# Patient Record
Sex: Male | Born: 1965 | Race: Black or African American | Hispanic: No | Marital: Single | Smoking: Former smoker
Health system: Southern US, Community
[De-identification: ages and names within clinical notes are randomized; demographics above are authoritative.]

## PROBLEM LIST (undated history)

## (undated) DIAGNOSIS — K219 Gastro-esophageal reflux disease without esophagitis: Secondary | ICD-10-CM

## (undated) DIAGNOSIS — I10 Essential (primary) hypertension: Secondary | ICD-10-CM

## (undated) HISTORY — DX: Gastro-esophageal reflux disease without esophagitis: K21.9

## (undated) HISTORY — PX: HAND SURGERY: SHX662

---

## 2018-02-19 ENCOUNTER — Encounter (HOSPITAL_COMMUNITY): Payer: Self-pay | Admitting: Emergency Medicine

## 2018-02-19 ENCOUNTER — Ambulatory Visit (HOSPITAL_COMMUNITY)
Admission: EM | Admit: 2018-02-19 | Discharge: 2018-02-19 | Disposition: A | Discharge status: Home / Self Care | Payer: Self-pay | Attending: Emergency Medicine | Admitting: Emergency Medicine

## 2018-02-19 ENCOUNTER — Other Ambulatory Visit: Payer: Self-pay

## 2018-02-19 DIAGNOSIS — R05 Cough: Secondary | ICD-10-CM

## 2018-02-19 DIAGNOSIS — R059 Cough, unspecified: Secondary | ICD-10-CM

## 2018-02-19 DIAGNOSIS — I10 Essential (primary) hypertension: Secondary | ICD-10-CM

## 2018-02-19 DIAGNOSIS — N3001 Acute cystitis with hematuria: Secondary | ICD-10-CM

## 2018-02-19 LAB — POCT URINALYSIS DIP (DEVICE)
Bilirubin Urine: NEGATIVE
GLUCOSE, UA: NEGATIVE mg/dL
Ketones, ur: 15 mg/dL — AB
Nitrite: NEGATIVE
Protein, ur: 30 mg/dL — AB
Specific Gravity, Urine: 1.02 (ref 1.005–1.030)
Urobilinogen, UA: >=8 mg/dL (ref 0.0–1.0)
pH: 7 (ref 5.0–8.0)

## 2018-02-19 MED ORDER — CEPHALEXIN 500 MG PO CAPS: 500.0000 mg | ORAL_CAPSULE | Freq: Four times a day (QID) | ORAL | 0 refills | Status: DC

## 2018-02-19 MED ORDER — LISINOPRIL 5 MG PO TABS: 10.0000 mg | ORAL_TABLET | Freq: Every day | ORAL | 1 refills | Status: DC

## 2018-02-19 NOTE — ED Provider Notes (Signed)
[place]    CSN: [id number] Arrival date & time: 02/19/18  1619     History   Chief Complaint Chief Complaint  Patient presents with  . URI    HPI Timothy Moore is a 52 y.o. male.   Pt here for cough that is getting better. Took nyquil lastnight for this and has noticed dark urine, lower back pain with flank pain, states that he does have bp issues not taking bp meds as prescribed. Thought that the meds caused his urine to become dark. Denies any headache, no sob, no chest pain no weakness       History reviewed. No pertinent past medical history.  There are no active problems to display for this patient.   History reviewed. No pertinent surgical history.     Home Medications    Prior to Admission medications   Medication Sig Start Date End Date Taking? Authorizing Provider  cephALEXin (KEFLEX) 500 MG capsule Take 1 capsule (500 mg total) by mouth 4 (four) times daily. 02/19/18   Marney Setting, NP  lisinopril (PRINIVIL,ZESTRIL) 5 MG tablet Take 2 tablets (10 mg total) by mouth daily. 02/19/18   Marney Setting, NP    Family History Family History  Problem Relation Age of Onset  . Kidney Stones Mother   . Cancer Father     Social History Social History   Tobacco Use  . Smoking status: Never Smoker  Substance Use Topics  . Alcohol use: Yes  . Drug use: Never     Allergies   Patient has no known allergies.   Review of Systems Review of Systems  HENT: Negative.   Respiratory: Positive for cough.   Cardiovascular: Negative.   Gastrointestinal: Negative.   Endocrine: Positive for polyuria.  Genitourinary: Positive for flank pain, frequency and urgency.  Skin: Negative.   Neurological: Negative.      Physical Exam Triage Vital Signs ED Triage Vitals  Enc Vitals Group     BP 02/19/18 1737 (!) 163/94     Pulse Rate 02/19/18 1737 98     Resp 02/19/18 1737 20     Temp 02/19/18 1737 97.7 F (36.5 C)     Temp Source  02/19/18 1737 Oral     SpO2 02/19/18 1737 100 %     Weight --      Height --      Head Circumference --      Peak Flow --      Pain Score 02/19/18 1734 5     Pain Loc --      Pain Edu? --      Excl. in [place]? --    No data found.  Updated Vital Signs BP (!) 163/94 (BP Location: Right Arm)   Pulse 98   Temp 97.7 F (36.5 C) (Oral)   Resp 20   SpO2 100%   Visual Acuity Right Eye Distance:   Left Eye Distance:   Bilateral Distance:    Right Eye Near:   Left Eye Near:    Bilateral Near:     Physical Exam  Constitutional: He appears well-developed.  HENT:  Head: Normocephalic.  Right Ear: External ear normal.  Left Ear: External ear normal.  Mouth/Throat: Oropharynx is clear and moist.  Eyes: Pupils are equal, round, and reactive to light.  Neck: Normal range of motion.  Cardiovascular: Normal rate and regular rhythm.  Pulmonary/Chest: Effort normal and breath sounds normal.  Abdominal: Soft. Bowel sounds are normal. There is  tenderness.  cv bil   Musculoskeletal: Normal range of motion.  Neurological: He is alert.  Skin: Skin is warm.     UC Treatments / Results  Labs (all labs ordered are listed, but only abnormal results are displayed) Labs Reviewed  POCT URINALYSIS DIP (DEVICE) - Abnormal; Notable for the following components:      Result Value   Ketones, ur 15 (*)    Hgb urine dipstick SMALL (*)    Protein, ur 30 (*)    Leukocytes, UA SMALL (*)    All other components within normal limits    EKG None  Radiology No results found.  Procedures Procedures (including critical care time)  Medications Ordered in UC Medications - No data to display  Initial Impression / Assessment and Plan / UC Course  I have reviewed the triage vital signs and the nursing notes.  Pertinent labs & imaging results that were available during my care of the patient were reviewed by me and considered in my medical decision making (see chart for details).     Will need  to cont bp meds  Stay hydrated Eat prior to abx  Follow up with a pcp for uncontrolled HTN IF you have symptoms of shortness of breath, chest pain, or headache you will need to go to the ER  Final Clinical Impressions(s) / UC Diagnoses   Final diagnoses:  Cough  Acute cystitis with hematuria  Essential hypertension     Discharge Instructions     Will need to cont bp meds  Stay hydrated Eat prior to abx  Follow up with a pcp for uncontrolled HTN IF you have symptoms of shortness of breath, chest pain, or headache you will need to go to the ER     ED Prescriptions    Medication Sig Dispense Auth. Provider   cephALEXin (KEFLEX) 500 MG capsule Take 1 capsule (500 mg total) by mouth 4 (four) times daily. 20 capsule Morley Kos L, NP   lisinopril (PRINIVIL,ZESTRIL) 5 MG tablet Take 2 tablets (10 mg total) by mouth daily. 30 tablet Marney Setting, NP     Controlled Substance Prescriptions [place] Controlled Substance Registry consulted? Not Applicable   Marney Setting, NP 02/19/18 1859

## 2018-02-19 NOTE — Discharge Instructions (Addendum)
Will need to cont bp meds  Stay hydrated Eat prior to abx  Follow up with a pcp for uncontrolled HTN IF you have symptoms of shortness of breath, chest pain, or headache you will need to go to the ER

## 2018-02-19 NOTE — ED Triage Notes (Signed)
Complains of lower back pain.  Patient reports urine is "yellow/orange " today.  Yesterday had cough, stuffy nose and dizzy and no energy.

## 2018-02-19 NOTE — ED Notes (Signed)
Sent to bathroom to get urine

## 2018-05-04 ENCOUNTER — Encounter (HOSPITAL_COMMUNITY): Payer: Self-pay

## 2018-05-04 ENCOUNTER — Ambulatory Visit (HOSPITAL_COMMUNITY)
Admission: EM | Admit: 2018-05-04 | Discharge: 2018-05-04 | Disposition: A | Discharge status: Home / Self Care | Payer: Self-pay | Attending: Family Medicine | Admitting: Family Medicine

## 2018-05-04 DIAGNOSIS — H66001 Acute suppurative otitis media without spontaneous rupture of ear drum, right ear: Secondary | ICD-10-CM

## 2018-05-04 DIAGNOSIS — H60501 Unspecified acute noninfective otitis externa, right ear: Secondary | ICD-10-CM

## 2018-05-04 MED ORDER — OFLOXACIN 0.3 % OT SOLN: 5.0000 [drp] | Freq: Every day | OTIC | 0 refills | Status: AC

## 2018-05-04 MED ORDER — AMOXICILLIN-POT CLAVULANATE 875-125 MG PO TABS: 1.0000 | ORAL_TABLET | Freq: Two times a day (BID) | ORAL | 0 refills | Status: AC

## 2018-05-04 NOTE — Discharge Instructions (Signed)
Complete course of ear drops and oral antibiotics for your ear infections.  Please follow up with primary care provider and/or ENT for any persistent or recurrent symptoms.

## 2018-05-04 NOTE — ED Triage Notes (Signed)
Pt present right ear pain, symptoms started last night. Pt states his right ear feels clogged and some drainage.

## 2018-05-05 NOTE — ED Provider Notes (Signed)
[place]    CSN: [id number] Arrival date & time: 05/04/18  1912     History   Chief Complaint Chief Complaint  Patient presents with  . Otalgia    Right Ear    HPI Timothy Moore is a 53 y.o. male.   Timothy Moore presents with complaints of right ear pain. Started out as itching, tried applying peroxide to a qtip to cleanse the ear which has not helped. Today has become painful with pain surrounding the ear. He feels a "clogged" sensation with decreased hearing. Has some runny nose. Hasn't tried any other medications for symptoms. Pain 9/10. Denies any previous similar. Denies ear bud use or underwater exposure. Hx of htn.    ROS per HPI.      History reviewed. No pertinent past medical history.  There are no active problems to display for this patient.   History reviewed. No pertinent surgical history.     Home Medications    Prior to Admission medications   Medication Sig Start Date End Date Taking? Authorizing Provider  amoxicillin-clavulanate (AUGMENTIN) 875-125 MG tablet Take 1 tablet by mouth every 12 (twelve) hours for 7 days. 05/04/18 05/11/18  Zigmund Gottron, NP  cephALEXin (KEFLEX) 500 MG capsule Take 1 capsule (500 mg total) by mouth 4 (four) times daily. 02/19/18   Marney Setting, NP  lisinopril (PRINIVIL,ZESTRIL) 5 MG tablet Take 2 tablets (10 mg total) by mouth daily. 02/19/18   Marney Setting, NP  ofloxacin (FLOXIN) 0.3 % OTIC solution Place 5 drops into the right ear daily for 7 days. 05/04/18 05/11/18  Zigmund Gottron, NP    Family History Family History  Problem Relation Age of Onset  . Kidney Stones Mother   . Cancer Father     Social History Social History   Tobacco Use  . Smoking status: Never Smoker  Substance Use Topics  . Alcohol use: Yes  . Drug use: Never     Allergies   Patient has no known allergies.   Review of Systems Review of Systems   Physical Exam Triage Vital Signs ED Triage Vitals  Enc  Vitals Group     BP 05/04/18 1948 (!) 154/109     Pulse Rate 05/04/18 1948 79     Resp 05/04/18 1948 18     Temp 05/04/18 1948 98.9 F (37.2 C)     Temp Source 05/04/18 1948 Oral     SpO2 05/04/18 1948 99 %     Weight --      Height --      Head Circumference --      Peak Flow --      Pain Score 05/04/18 1949 10     Pain Loc --      Pain Edu? --      Excl. in [place]? --    No data found.  Updated Vital Signs BP (!) 154/109 (BP Location: Right Arm)   Pulse 79   Temp 98.9 F (37.2 C) (Oral)   Resp 18   SpO2 99%   Visual Acuity Right Eye Distance:   Left Eye Distance:   Bilateral Distance:    Right Eye Near:   Left Eye Near:    Bilateral Near:     Physical Exam Constitutional:      Appearance: He is well-developed.  HENT:     Head: Normocephalic and atraumatic.     Right Ear: Drainage, swelling and tenderness present. Tympanic membrane is erythematous.  Left Ear: Tympanic membrane and ear canal normal.     Ears:     Comments: TM partially occluded by canal white drainage, visible TM with redness    Nose: Nose normal.  Cardiovascular:     Rate and Rhythm: Normal rate and regular rhythm.  Pulmonary:     Effort: Pulmonary effort is normal.     Breath sounds: Normal breath sounds.  Skin:    General: Skin is warm and dry.  Neurological:     Mental Status: He is alert and oriented to person, place, and time.      UC Treatments / Results  Labs (all labs ordered are listed, but only abnormal results are displayed) Labs Reviewed - No data to display  EKG None  Radiology No results found.  Procedures Procedures (including critical care time)  Medications Ordered in UC Medications - No data to display  Initial Impression / Assessment and Plan / UC Course  I have reviewed the triage vital signs and the nursing notes.  Pertinent labs & imaging results that were available during my care of the patient were reviewed by me and considered in my medical  decision making (see chart for details).     Concern for AOM as well as otitis externa. augmentin and ofloxacin provided. Return precautions provided. If symptoms worsen or do not improve in the next week to return to be seen or to follow up with PCP and/or ENT.  Patient verbalized understanding and agreeable to plan.    Final Clinical Impressions(s) / UC Diagnoses   Final diagnoses:  Acute otitis externa of right ear, unspecified type  Acute suppurative otitis media of right ear without spontaneous rupture of tympanic membrane, recurrence not specified     Discharge Instructions     Complete course of ear drops and oral antibiotics for your ear infections.  Please follow up with primary care provider and/or ENT for any persistent or recurrent symptoms.    ED Prescriptions    Medication Sig Dispense Auth. Provider   amoxicillin-clavulanate (AUGMENTIN) 875-125 MG tablet Take 1 tablet by mouth every 12 (twelve) hours for 7 days. 14 tablet Augusto Gamble B, NP   ofloxacin (FLOXIN) 0.3 % OTIC solution Place 5 drops into the right ear daily for 7 days. 5 mL Augusto Gamble B, NP     Controlled Substance Prescriptions [place] Controlled Substance Registry consulted? Not Applicable   Zigmund Gottron, NP 05/05/18 1009

## 2018-12-17 ENCOUNTER — Other Ambulatory Visit: Payer: Self-pay

## 2018-12-17 ENCOUNTER — Ambulatory Visit (HOSPITAL_COMMUNITY)
Admission: EM | Admit: 2018-12-17 | Discharge: 2018-12-17 | Disposition: A | Discharge status: Home / Self Care | Payer: Self-pay | Attending: Family Medicine | Admitting: Family Medicine

## 2018-12-17 ENCOUNTER — Encounter (HOSPITAL_COMMUNITY): Payer: Self-pay | Admitting: Emergency Medicine

## 2018-12-17 DIAGNOSIS — I10 Essential (primary) hypertension: Secondary | ICD-10-CM | POA: Insufficient documentation

## 2018-12-17 DIAGNOSIS — R350 Frequency of micturition: Secondary | ICD-10-CM | POA: Insufficient documentation

## 2018-12-17 DIAGNOSIS — N50819 Testicular pain, unspecified: Secondary | ICD-10-CM | POA: Insufficient documentation

## 2018-12-17 HISTORY — DX: Essential (primary) hypertension: I10

## 2018-12-17 LAB — POCT URINALYSIS DIP (DEVICE)
Bilirubin Urine: NEGATIVE
Glucose, UA: NEGATIVE mg/dL
Hgb urine dipstick: NEGATIVE
Ketones, ur: NEGATIVE mg/dL
Leukocytes,Ua: NEGATIVE
Nitrite: NEGATIVE
Protein, ur: NEGATIVE mg/dL
Specific Gravity, Urine: 1.025 (ref 1.005–1.030)
Urobilinogen, UA: 0.2 mg/dL (ref 0.0–1.0)
pH: 7 (ref 5.0–8.0)

## 2018-12-17 MED ORDER — SULFAMETHOXAZOLE-TRIMETHOPRIM 800-160 MG PO TABS: 1.0000 | ORAL_TABLET | Freq: Two times a day (BID) | ORAL | 0 refills | Status: DC

## 2018-12-17 MED ORDER — LISINOPRIL 10 MG PO TABS: 10.0000 mg | ORAL_TABLET | Freq: Every day | ORAL | 1 refills | Status: DC

## 2018-12-17 NOTE — ED Provider Notes (Signed)
[place] [facility]    CSN: [id number] Arrival date & time: 12/17/18  1850      History   Chief Complaint Chief Complaint  Patient presents with  . Testicle Pain  . Urinary Frequency  . Flank Pain    HPI Timothy Moore is a 53 y.o. male.   HPI Patient has some left lower abdominal pain, left testicle pain, and pain with urination.  States he has urinary frequency.  He states sometimes he cannot get to the bathroom in time.  He states he has to get up more in the night to urinate.  He is 53 years old.  He is never been told he has prostate problems.  He does not go to the doctor on a regular basis.  He has been told he has elevated blood pressure.  He has not taken his blood pressure medicine for some time.  He states that he has 2 "lady friends".  He does not think he has any infection, but would like to be tested for STDs.  No penile discharge, no rash.  No history of kidney stones or problems. Past Medical History:  Diagnosis Date  . Hypertension     There are no active problems to display for this patient.   History reviewed. No pertinent surgical history.     Home Medications    Prior to Admission medications   Medication Sig Start Date End Date Taking? Authorizing Provider  lisinopril (ZESTRIL) 10 MG tablet Take 1 tablet (10 mg total) by mouth daily. 12/17/18   Raylene Everts, MD  sulfamethoxazole-trimethoprim (BACTRIM DS) 800-160 MG tablet Take 1 tablet by mouth 2 (two) times daily. 12/17/18   Raylene Everts, MD    Family History Family History  Problem Relation Age of Onset  . Kidney Stones Mother   . Cancer Father     Social History Social History   Tobacco Use  . Smoking status: Never Smoker  Substance Use Topics  . Alcohol use: Yes  . Drug use: Never     Allergies   Patient has no known allergies.   Review of Systems Review of Systems  Constitutional: Negative for chills and fever.  HENT: Negative for ear pain and sore throat.    Eyes: Negative for pain and visual disturbance.  Respiratory: Negative for cough and shortness of breath.   Cardiovascular: Negative for chest pain and palpitations.  Gastrointestinal: Negative for abdominal pain and vomiting.  Genitourinary: Positive for difficulty urinating, frequency, penile pain and testicular pain. Negative for dysuria and hematuria.  Musculoskeletal: Negative for arthralgias and back pain.  Skin: Negative for color change and rash.  Neurological: Negative for seizures and syncope.  All other systems reviewed and are negative.    Physical Exam Triage Vital Signs ED Triage Vitals  Enc Vitals Group     BP 12/17/18 1920 (!) 167/108     Pulse Rate 12/17/18 1920 67     Resp 12/17/18 1920 14     Temp 12/17/18 1920 97.7 F (36.5 C)     Temp src --      SpO2 12/17/18 1920 100 %     Weight --      Height --      Head Circumference --      Peak Flow --      Pain Score 12/17/18 1923 2     Pain Loc --      Pain Edu? --      Excl. in [place]? --  No data found.  Updated Vital Signs BP (!) 167/108   Pulse 67   Temp 97.7 F (36.5 C)   Resp 14   SpO2 100%       Physical Exam Constitutional:      General: He is not in acute distress.    Appearance: He is well-developed. He is not ill-appearing.  HENT:     Head: Normocephalic and atraumatic.  Eyes:     Conjunctiva/sclera: Conjunctivae normal.     Pupils: Pupils are equal, round, and reactive to light.  Neck:     Musculoskeletal: Normal range of motion.  Cardiovascular:     Rate and Rhythm: Normal rate and regular rhythm.     Heart sounds: Normal heart sounds.  Pulmonary:     Effort: Pulmonary effort is normal. No respiratory distress.     Breath sounds: Normal breath sounds.  Abdominal:     General: Abdomen is flat. There is no distension.     Palpations: Abdomen is soft.     Tenderness: There is no abdominal tenderness. There is no right CVA tenderness or left CVA tenderness.  Genitourinary:     Penis: Normal.      Scrotum/Testes: Normal.  Musculoskeletal: Normal range of motion.  Skin:    General: Skin is warm and dry.  Neurological:     General: No focal deficit present.     Mental Status: He is alert.  Psychiatric:        Behavior: Behavior normal.      UC Treatments / Results  Labs (all labs ordered are listed, but only abnormal results are displayed) Labs Reviewed  POCT URINALYSIS DIP (DEVICE)  CYTOLOGY, (ORAL, ANAL, URETHRAL) ANCILLARY ONLY    EKG   Radiology No results found.  Procedures Procedures (including critical care time)  Medications Ordered in UC Medications - No data to display  Initial Impression / Assessment and Plan / UC Course  I have reviewed the triage vital signs and the nursing notes.  Pertinent labs & imaging results that were available during my care of the patient were reviewed by me and considered in my medical decision making (see chart for details).     Treating the patient with antibiotics even though his urine sample is negative, I concerned that he may have prostate infection causing his back and testicular pain.  No hematuria to suggest kidney stones.  His pain is not particularly severe.  He is not taking any medicine for pain.  I did discuss with him the importance of staying on blood pressure medication.  STD testing is done Final Clinical Impressions(s) / UC Diagnoses   Final diagnoses:  Pain in testicle, unspecified laterality  Urinary frequency  Essential hypertension     Discharge Instructions     We will call you if any of your tests are positive Take the antibiotic 2 times a day I have refilled your blood pressure medicine for you.  Take this once a day You need to see a primary care doctor.  I have included one that is taking new patients Drink more water Try to get enough exercise   ED Prescriptions    Medication Sig Dispense Auth. Provider   lisinopril (ZESTRIL) 10 MG tablet Take 1 tablet (10 mg  total) by mouth daily. 30 tablet Raylene Everts, MD   sulfamethoxazole-trimethoprim (BACTRIM DS) 800-160 MG tablet Take 1 tablet by mouth 2 (two) times daily. 20 tablet Raylene Everts, MD     PDMP not reviewed  this encounter.   Raylene Everts, MD 12/17/18 2151

## 2018-12-17 NOTE — Discharge Instructions (Addendum)
We will call you if any of your tests are positive Take the antibiotic 2 times a day I have refilled your blood pressure medicine for you.  Take this once a day You need to see a primary care doctor.  I have included one that is taking new patients Drink more water Try to get enough exercise

## 2018-12-17 NOTE — ED Triage Notes (Signed)
Pt c.o frequent urination x3 days,started having R flank pain, and L testicle pain today.

## 2018-12-19 LAB — CYTOLOGY, (ORAL, ANAL, URETHRAL) ANCILLARY ONLY
Chlamydia: NEGATIVE
Neisseria Gonorrhea: NEGATIVE
Trichomonas: POSITIVE — AB

## 2018-12-30 ENCOUNTER — Telehealth (HOSPITAL_COMMUNITY): Payer: Self-pay | Admitting: Emergency Medicine

## 2018-12-30 MED ORDER — METRONIDAZOLE 500 MG PO TABS: 2000.0000 mg | ORAL_TABLET | Freq: Once | ORAL | 0 refills | Status: AC

## 2018-12-30 NOTE — Telephone Encounter (Signed)
Trichomonas is positive. Rx  for Flagyl 2 grams, once was sent to the pharmacy of record. Pt needs education to refrain from sexual intercourse for 7 days to give the medicine time to work. Sexual partners need to be notified and tested/treated. Condoms may reduce risk of reinfection. Recheck for further evaluation if symptoms are not improving.   Patient contacted and made aware of    results, all questions answered    

## 2019-04-05 ENCOUNTER — Ambulatory Visit: Payer: Self-pay | Attending: Internal Medicine

## 2019-04-05 DIAGNOSIS — Z20822 Contact with and (suspected) exposure to covid-19: Secondary | ICD-10-CM | POA: Insufficient documentation

## 2019-04-06 ENCOUNTER — Telehealth: Payer: Self-pay | Admitting: General Practice

## 2019-04-06 ENCOUNTER — Telehealth: Payer: Self-pay | Admitting: *Deleted

## 2019-04-06 LAB — NOVEL CORONAVIRUS, NAA: SARS-CoV-2, NAA: NOT DETECTED

## 2019-04-06 NOTE — Telephone Encounter (Signed)
pateint called in and received his negative covid test result

## 2019-04-06 NOTE — Telephone Encounter (Signed)
Pt notified that his results for covid 19 has not resulted. He voiced understanding.

## 2019-05-01 ENCOUNTER — Other Ambulatory Visit: Payer: Self-pay

## 2019-05-01 ENCOUNTER — Emergency Department
Admission: EM | Admit: 2019-05-01 | Discharge: 2019-05-01 | Disposition: A | Discharge status: Home / Self Care | Payer: Self-pay | Attending: Emergency Medicine | Admitting: Emergency Medicine

## 2019-05-01 ENCOUNTER — Emergency Department: Payer: Self-pay

## 2019-05-01 DIAGNOSIS — I10 Essential (primary) hypertension: Secondary | ICD-10-CM | POA: Insufficient documentation

## 2019-05-01 DIAGNOSIS — W11XXXA Fall on and from ladder, initial encounter: Secondary | ICD-10-CM | POA: Insufficient documentation

## 2019-05-01 DIAGNOSIS — Y999 Unspecified external cause status: Secondary | ICD-10-CM | POA: Insufficient documentation

## 2019-05-01 DIAGNOSIS — Y939 Activity, unspecified: Secondary | ICD-10-CM | POA: Insufficient documentation

## 2019-05-01 DIAGNOSIS — W19XXXA Unspecified fall, initial encounter: Secondary | ICD-10-CM

## 2019-05-01 DIAGNOSIS — Y929 Unspecified place or not applicable: Secondary | ICD-10-CM | POA: Insufficient documentation

## 2019-05-01 DIAGNOSIS — S0003XA Contusion of scalp, initial encounter: Secondary | ICD-10-CM | POA: Insufficient documentation

## 2019-05-01 DIAGNOSIS — Z79899 Other long term (current) drug therapy: Secondary | ICD-10-CM | POA: Insufficient documentation

## 2019-05-01 MED ORDER — MELOXICAM 7.5 MG PO TABS
15.0000 mg | ORAL_TABLET | Freq: Once | ORAL | Status: AC
  Administered 2019-05-01: 15 mg via ORAL
  Filled 2019-05-01: qty 2

## 2019-05-01 MED ORDER — METHOCARBAMOL 500 MG PO TABS
1000.0000 mg | ORAL_TABLET | Freq: Once | ORAL | Status: AC
  Administered 2019-05-01: 1000 mg via ORAL
  Filled 2019-05-01: qty 2

## 2019-05-01 MED ORDER — METHOCARBAMOL 500 MG PO TABS: 500.0000 mg | ORAL_TABLET | Freq: Four times a day (QID) | ORAL | 0 refills | Status: DC

## 2019-05-01 MED ORDER — OXYCODONE-ACETAMINOPHEN 5-325 MG PO TABS
1.0000 | ORAL_TABLET | Freq: Once | ORAL | Status: AC
  Administered 2019-05-01: 1 via ORAL
  Filled 2019-05-01: qty 1

## 2019-05-01 MED ORDER — MELOXICAM 15 MG PO TABS: 15.0000 mg | ORAL_TABLET | Freq: Every day | ORAL | 0 refills | Status: DC

## 2019-05-01 NOTE — ED Triage Notes (Signed)
Pt was about 5 feet up a ladder and fell states landing on his head. Slight hematoma noted to top of head, no loc. Pt co pain to area, no loc. Co neck stiffness, states happened around 1400.

## 2019-05-01 NOTE — ED Notes (Signed)
E-signature not working at this time. Pt verbalized understanding of D/C instructions, prescriptions and follow up care with no further questions at this time. Pt in NAD and ambulatory at time of D/C.  

## 2019-05-01 NOTE — ED Notes (Signed)
C-collar in place

## 2019-05-01 NOTE — ED Provider Notes (Signed)
[facility] Emergency Department Provider Note  ____________________________________________  Time seen: Approximately 9:27 PM  I have reviewed the triage vital signs and the nursing notes.   HISTORY  Chief Complaint Head Injury    HPI Timothy Moore is a 54 y.o. male who presents the emergency department for evaluation of head injury after falling off a ladder.  Patient reports that he is approximately 5 feet off a ladder when he fell.  Patient did hit his head on the floor.  He did not lose consciousness.  Patient is currently complaining of a "knot" to the top of his head, headache, neck stiffness.  Patient denies any significant neck pain, states that it feels "stiff."  No radicular symptoms in the upper extremities.  No bowel or bladder tension, saddle anesthesia or paresthesias.  No other injuries or complaints.         Past Medical History:  Diagnosis Date  . Hypertension     There are no problems to display for this patient.   No past surgical history on file.  Prior to Admission medications   Medication Sig Start Date End Date Taking? Authorizing Provider  lisinopril (ZESTRIL) 10 MG tablet Take 1 tablet (10 mg total) by mouth daily. 12/17/18   Raylene Everts, MD  meloxicam (MOBIC) 15 MG tablet Take 1 tablet (15 mg total) by mouth daily. 05/01/19   Krystalle Pilkington, Charline Bills, PA-C  methocarbamol (ROBAXIN) 500 MG tablet Take 1 tablet (500 mg total) by mouth 4 (four) times daily. 05/01/19   Peony Barner, Charline Bills, PA-C  sulfamethoxazole-trimethoprim (BACTRIM DS) 800-160 MG tablet Take 1 tablet by mouth 2 (two) times daily. 12/17/18   Raylene Everts, MD    Allergies Patient has no known allergies.  Family History  Problem Relation Age of Onset  . Kidney Stones Mother   . Cancer Father     Social History Social History   Tobacco Use  . Smoking status: Never Smoker  Substance Use Topics  . Alcohol use: Yes  . Drug use: Never     Review  of Systems  Constitutional: No fever/chills Eyes: No visual changes. No discharge ENT: No upper respiratory complaints. Cardiovascular: no chest pain. Respiratory: no cough. No SOB. Gastrointestinal: No abdominal pain.  No nausea, no vomiting.  No diarrhea.  No constipation. Musculoskeletal: Positive for neck stiffness Skin: Negative for rash, abrasions, lacerations, ecchymosis. Neurological: Positive for head injury resulting in a headache but denies focal weakness or numbness. 10-point ROS otherwise negative.  ____________________________________________   PHYSICAL EXAM:  VITAL SIGNS: ED Triage Vitals [05/01/19 1930]  Enc Vitals Group     BP (!) 166/106     Pulse Rate 83     Resp 20     Temp 98.8 F (37.1 C)     Temp Source Oral     SpO2 98 %     Weight 170 lb (77.1 kg)     Height 5\' 5"  (1.651 m)     Head Circumference      Peak Flow      Pain Score 7     Pain Loc      Pain Edu?      Excl. in [place]?      Constitutional: Alert and oriented. Well appearing and in no acute distress. Eyes: Conjunctivae are normal. PERRL. EOMI. Head: Hematoma noted to the mid occipital skull region.  No lacerations or abrasions.  Patient with tenderness to palpation over the hematoma but no other tenderness to  palpation of the osseous structures of the skull and face.  No battle signs, raccoon eyes, serosanguineous fluid drainage from the ears or nares. ENT:      Ears:       Nose: No congestion/rhinnorhea.      Mouth/Throat: Mucous membranes are moist.  Neck: No stridor.  No midline cervical spine tenderness to palpation.  No tenderness to palpation over the midline cervical spine.  No palpable abnormalities or deficits.  Patient is diffusely tender to palpation in the paraspinal muscle regions extending from roughly C5-T3 region.  This is bilateral in nature.  Radial pulse intact bilateral upper extremities.  Sensation intact and equal bilateral upper extremities.  Cardiovascular: Normal  rate, regular rhythm. Normal S1 and S2.  Good peripheral circulation. Respiratory: Normal respiratory effort without tachypnea or retractions. Lungs CTAB. Good air entry to the bases with no decreased or absent breath sounds. Musculoskeletal: Full range of motion to all extremities. No gross deformities appreciated. Neurologic:  Normal speech and language. No gross focal neurologic deficits are appreciated.  Skin:  Skin is warm, dry and intact. No rash noted. Psychiatric: Mood and affect are normal. Speech and behavior are normal. Patient exhibits appropriate insight and judgement.   ____________________________________________   LABS (all labs ordered are listed, but only abnormal results are displayed)  Labs Reviewed - No data to display ____________________________________________  EKG   ____________________________________________  RADIOLOGY I personally viewed and evaluated these images as part of my medical decision making, as well as reviewing the written report by the radiologist.  CT Head Wo Contrast  Result Date: 05/01/2019 CLINICAL DATA:  54 year old who fell approximately 5 feet off of a ladder around 2 o'clock this afternoon, striking his head. Pain near the vertex. Neck stiffness. No loss of consciousness. Initial encounter. EXAM: CT HEAD WITHOUT CONTRAST CT CERVICAL SPINE WITHOUT CONTRAST TECHNIQUE: Multidetector CT imaging of the head and cervical spine was performed following the standard protocol without intravenous contrast. Multiplanar CT image reconstructions of the cervical spine were also generated. COMPARISON:  None. FINDINGS: CT HEAD FINDINGS Brain: Ventricular system normal in size and appearance for age. Cavum septum pellucidum and cavum vergae, normal anatomic variants. No mass lesion. No midline shift. No acute hemorrhage or hematoma. No extra-axial fluid collections. No evidence of acute infarction. Vascular: No hyperdense vessel. No visible atherosclerosis.  Skull: No skull fracture or other focal osseous abnormality involving the skull. Sinuses/Orbits: Visualized paranasal sinuses, bilateral mastoid air cells and bilateral middle ear cavities well-aerated. Visualized orbits and globes normal in appearance. Other: None. CT CERVICAL SPINE FINDINGS Alignment: Anatomic posterior alignment. Straightening of the usual cervical lordosis. Facet joints anatomically aligned throughout. Skull base and vertebrae: No fractures identified involving the cervical spine. Coronal reformatted images demonstrate an intact craniocervical junction, intact dens and intact lateral masses throughout. Soft tissues and spinal canal: No evidence of paraspinous or spinal canal hematoma. No evidence of spinal stenosis. Disc levels: Mild central disc protrusion at C4-5. Mild disc space narrowing at C5-6 and moderate disc space narrowing at C7-T1. Calcification in the ANTERIOR annular fibers at C4-5, C5-6 and C6-7. Mild facet and uncinate hypertrophy account for multilevel foraminal stenoses including mild RIGHT and moderate LEFT C2-3, moderate LEFT and mild RIGHT C5-6. Upper chest: Visualized lung apices clear. Visualized superior mediastinum normal. Other: None. IMPRESSION: 1. Normal unenhanced CT of the brain. 2. No cervical spine fractures identified. 3. Mild central disc protrusion at C4-5. Multilevel degenerative disc disease, spondylosis and foraminal stenoses as detailed above. Electronically Signed  By: Evangeline Dakin M.D.   On: 05/01/2019 20:06   CT Cervical Spine Wo Contrast  Result Date: 05/01/2019 CLINICAL DATA:  54 year old who fell approximately 5 feet off of a ladder around 2 o'clock this afternoon, striking his head. Pain near the vertex. Neck stiffness. No loss of consciousness. Initial encounter. EXAM: CT HEAD WITHOUT CONTRAST CT CERVICAL SPINE WITHOUT CONTRAST TECHNIQUE: Multidetector CT imaging of the head and cervical spine was performed following the standard protocol  without intravenous contrast. Multiplanar CT image reconstructions of the cervical spine were also generated. COMPARISON:  None. FINDINGS: CT HEAD FINDINGS Brain: Ventricular system normal in size and appearance for age. Cavum septum pellucidum and cavum vergae, normal anatomic variants. No mass lesion. No midline shift. No acute hemorrhage or hematoma. No extra-axial fluid collections. No evidence of acute infarction. Vascular: No hyperdense vessel. No visible atherosclerosis. Skull: No skull fracture or other focal osseous abnormality involving the skull. Sinuses/Orbits: Visualized paranasal sinuses, bilateral mastoid air cells and bilateral middle ear cavities well-aerated. Visualized orbits and globes normal in appearance. Other: None. CT CERVICAL SPINE FINDINGS Alignment: Anatomic posterior alignment. Straightening of the usual cervical lordosis. Facet joints anatomically aligned throughout. Skull base and vertebrae: No fractures identified involving the cervical spine. Coronal reformatted images demonstrate an intact craniocervical junction, intact dens and intact lateral masses throughout. Soft tissues and spinal canal: No evidence of paraspinous or spinal canal hematoma. No evidence of spinal stenosis. Disc levels: Mild central disc protrusion at C4-5. Mild disc space narrowing at C5-6 and moderate disc space narrowing at C7-T1. Calcification in the ANTERIOR annular fibers at C4-5, C5-6 and C6-7. Mild facet and uncinate hypertrophy account for multilevel foraminal stenoses including mild RIGHT and moderate LEFT C2-3, moderate LEFT and mild RIGHT C5-6. Upper chest: Visualized lung apices clear. Visualized superior mediastinum normal. Other: None. IMPRESSION: 1. Normal unenhanced CT of the brain. 2. No cervical spine fractures identified. 3. Mild central disc protrusion at C4-5. Multilevel degenerative disc disease, spondylosis and foraminal stenoses as detailed above. Electronically Signed   By: Evangeline Dakin M.D.   On: 05/01/2019 20:06    ____________________________________________    PROCEDURES  Procedure(s) performed:    Procedures    Medications  oxyCODONE-acetaminophen (PERCOCET/ROXICET) 5-325 MG per tablet 1 tablet (has no administration in time range)  methocarbamol (ROBAXIN) tablet 1,000 mg (has no administration in time range)  meloxicam (MOBIC) tablet 15 mg (has no administration in time range)     ____________________________________________   INITIAL IMPRESSION / ASSESSMENT AND PLAN / ED COURSE  Pertinent labs & imaging results that were available during my care of the patient were reviewed by me and considered in my medical decision making (see chart for details).  Review of the [place] CSRS was performed in accordance of the [place] prior to dispensing any controlled drugs.           Patient's diagnosis is consistent with fall resulting in skull contusion, cervical strain.  Patient presented to emergency department after falling off of a ladder.  Patient states he fell backwards, he did strike his head on the ground.  Patient did not lose consciousness.  Hematoma was noted to the skull.  Imaging is reassuring with no acute skull fracture or intracranial hemorrhage.  Degeneration throughout the cervical spine is identified with no acute fractures.  Patient was nontender to palpation midline but slightly tender to palpation over bilateral paraspinal muscle regions.  Patient was neurologically intact.  This time patient will be placed on anti-inflammatories, muscle  relaxers.  Follow-up with primary care as needed..  Patient is given ED precautions to return to the ED for any worsening or new symptoms.     ____________________________________________  FINAL CLINICAL IMPRESSION(S) / ED DIAGNOSES  Final diagnoses:  Fall, initial encounter  Hematoma of scalp, initial encounter      NEW MEDICATIONS STARTED DURING THIS VISIT:  ED Discharge Orders          Ordered    meloxicam (MOBIC) 15 MG tablet  Daily     05/01/19 2140    methocarbamol (ROBAXIN) 500 MG tablet  4 times daily     05/01/19 2140              This chart was dictated using voice recognition software/Dragon. Despite best efforts to proofread, errors can occur which can change the meaning. Any change was purely unintentional.    Darletta Moll, PA-C 05/01/19 2140    Earleen Newport, MD 05/01/19 [phone]

## 2019-06-03 ENCOUNTER — Other Ambulatory Visit: Payer: Self-pay

## 2019-06-03 ENCOUNTER — Encounter (HOSPITAL_COMMUNITY): Payer: Self-pay | Admitting: Emergency Medicine

## 2019-06-03 ENCOUNTER — Ambulatory Visit (HOSPITAL_COMMUNITY)
Admission: EM | Admit: 2019-06-03 | Discharge: 2019-06-03 | Disposition: A | Discharge status: Home / Self Care | Payer: Self-pay | Attending: Family Medicine | Admitting: Family Medicine

## 2019-06-03 DIAGNOSIS — I10 Essential (primary) hypertension: Secondary | ICD-10-CM

## 2019-06-03 DIAGNOSIS — S39012A Strain of muscle, fascia and tendon of lower back, initial encounter: Secondary | ICD-10-CM

## 2019-06-03 DIAGNOSIS — S161XXA Strain of muscle, fascia and tendon at neck level, initial encounter: Secondary | ICD-10-CM

## 2019-06-03 MED ORDER — CYCLOBENZAPRINE HCL 10 MG PO TABS: ORAL_TABLET | ORAL | 0 refills | Status: DC

## 2019-06-03 MED ORDER — DICLOFENAC SODIUM 75 MG PO TBEC: 75.0000 mg | DELAYED_RELEASE_TABLET | Freq: Two times a day (BID) | ORAL | 0 refills | Status: DC
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
## 2019-07-23 ENCOUNTER — Ambulatory Visit
Admission: EM | Admit: 2019-07-23 | Discharge: 2019-07-23 | Disposition: A | Discharge status: Home / Self Care | Payer: Self-pay

## 2019-07-23 DIAGNOSIS — S61412A Laceration without foreign body of left hand, initial encounter: Secondary | ICD-10-CM

## 2019-07-23 DIAGNOSIS — Z23 Encounter for immunization: Secondary | ICD-10-CM

## 2019-07-23 MED ORDER — TETANUS-DIPHTH-ACELL PERTUSSIS 5-2.5-18.5 LF-MCG/0.5 IM SUSP
0.5000 mL | Freq: Once | INTRAMUSCULAR | Status: AC
  Administered 2019-07-23: 0.5 mL via INTRAMUSCULAR

## 2019-07-23 NOTE — ED Triage Notes (Signed)
Pt states cut the inside of left hand with a piece of sheet metal an hour ago. Bleeding controlled.

## 2019-07-23 NOTE — Discharge Instructions (Signed)
Tetanus updated. 6 sutures placed. You can remove current dressing in 24 hours. Keep wound clean and dry. You can clean gently with soap and water. Do not soak area in water. Monitor for spreading redness, increased warmth, increased swelling, fever, follow up for reevaluation needed. Otherwise follow up in 7 days for suture removal.

## 2019-07-23 NOTE — ED Provider Notes (Signed)
[facility]    CSN: [id number] Arrival date & time: 07/23/19  1451      History   Chief Complaint Chief Complaint  Patient presents with  . Laceration    HPI Timothy Moore is a 54 y.o. male.   54 year old male comes in for left hand laceration shortly prior to arrival. Laceration sustained from sheet metal. Applied pressure to wound and came in for evaluation. Has baseline decreased in ROM of fingers due to prior injury. Denies worsening. Unknown last tetanus.      Past Medical History:  Diagnosis Date  . Hypertension     There are no problems to display for this patient.   History reviewed. No pertinent surgical history.     Home Medications    Prior to Admission medications   Medication Sig Start Date End Date Taking? Authorizing Provider  Multiple Vitamin (MULTIVITAMIN) tablet Take 1 tablet by mouth daily.   Yes [provider]  lisinopril (ZESTRIL) 10 MG tablet Take 1 tablet (10 mg total) by mouth daily. 12/17/18   Raylene Everts, MD    Family History Family History  Problem Relation Age of Onset  . Kidney Stones Mother   . Cancer Father     Social History Social History   Tobacco Use  . Smoking status: Never Smoker  . Smokeless tobacco: Never Used  Substance Use Topics  . Alcohol use: Yes  . Drug use: Never     Allergies   Patient has no known allergies.   Review of Systems Review of Systems  Reason unable to perform ROS: See HPI as above.     Physical Exam Triage Vital Signs ED Triage Vitals  Enc Vitals Group     BP 07/23/19 1509 (!) 145/94     Pulse Rate 07/23/19 1509 74     Resp 07/23/19 1509 16     Temp 07/23/19 1509 98.4 F (36.9 C)     Temp Source 07/23/19 1509 Oral     SpO2 07/23/19 1509 98 %     Weight --      Height --      Head Circumference --      Peak Flow --      Pain Score 07/23/19 1515 5     Pain Loc --      Pain Edu? --      Excl. in [place]? --    No data found.  Updated Vital  Signs BP (!) 145/94 (BP Location: Left Arm)   Pulse 74   Temp 98.4 F (36.9 C) (Oral)   Resp 16   SpO2 98%   Visual Acuity Right Eye Distance:   Left Eye Distance:   Bilateral Distance:    Right Eye Near:   Left Eye Near:    Bilateral Near:     Physical Exam Constitutional:      General: He is not in acute distress.    Appearance: Normal appearance. He is well-developed. He is not toxic-appearing or diaphoretic.  HENT:     Head: Normocephalic and atraumatic.  Eyes:     Conjunctiva/sclera: Conjunctivae normal.     Pupils: Pupils are equal, round, and reactive to light.  Pulmonary:     Effort: Pulmonary effort is normal. No respiratory distress.     Comments: Speaking in full sentences without difficulty Musculoskeletal:     Cervical back: Normal range of motion and neck supple.     Comments: 3cm laceration to the left thenar  aspect. Bleeding controlled with pressure. Full ROM of wrist. Fingers with decreased flexion/strength that is at baseline. Decreased sensation surrounding laceration. Sensation intact and equal to the fingers. Radial pulse 2+, cap refill <2s  Skin:    General: Skin is warm and dry.  Neurological:     Mental Status: He is alert and oriented to person, place, and time.      UC Treatments / Results  Labs (all labs ordered are listed, but only abnormal results are displayed) Labs Reviewed - No data to display  EKG   Radiology No results found.  Procedures Laceration Repair  Date/Time: 07/23/2019 4:07 PM Performed by: Ok Edwards, PA-C Authorized by: Ok Edwards, PA-C   Consent:    Consent obtained:  Verbal   Consent given by:  Patient   Risks discussed:  Infection, pain, poor cosmetic result, poor wound healing, nerve damage and tendon damage   Alternatives discussed:  Delayed treatment and referral Anesthesia (see MAR for exact dosages):    Anesthesia method:  Local infiltration   Local anesthetic:  Lidocaine 2% w/o epi Laceration details:     Location:  Hand   Hand location:  L palm   Length (cm):  3   Depth (mm):  5 Repair type:    Repair type:  Simple Pre-procedure details:    Preparation:  Patient was prepped and draped in usual sterile fashion Exploration:    Hemostasis achieved with:  Direct pressure   Wound exploration: wound explored through full range of motion and entire depth of wound probed and visualized     Contaminated: no   Treatment:    Area cleansed with:  Saline, Hibiclens and soap and water   Amount of cleaning:  Extensive   Irrigation solution:  Sterile saline   Irrigation method:  Pressure wash   Visualized foreign bodies/material removed: no   Skin repair:    Repair method:  Sutures   Suture size:  4-0   Suture material:  Prolene   Suture technique:  Simple interrupted   Number of sutures:  6 Approximation:    Approximation:  Close Post-procedure details:    Dressing:  Antibiotic ointment and bulky dressing   Patient tolerance of procedure:  Tolerated well, no immediate complications   (including critical care time)  Medications Ordered in UC Medications  Tdap (BOOSTRIX) injection 0.5 mL (0.5 mLs Intramuscular Given 07/23/19 1528)    Initial Impression / Assessment and Plan / UC Course  I have reviewed the triage vital signs and the nursing notes.  Pertinent labs & imaging results that were available during my care of the patient were reviewed by me and considered in my medical decision making (see chart for details).    Tetanus updated today. Patient tolerated procedure well. 6 sutures placed. Wound care instructions given. Return precautions given. Otherwise, follow up in 7 days for suture removal. Patient expresses understanding and agrees to plan.   Final Clinical Impressions(s) / UC Diagnoses   Final diagnoses:  Laceration of left hand without foreign body, initial encounter    ED Prescriptions    None     PDMP not reviewed this encounter.   Ok Edwards, PA-C 07/23/19  1609

## 2019-08-01 ENCOUNTER — Ambulatory Visit
Admission: EM | Admit: 2019-08-01 | Discharge: 2019-08-01 | Disposition: A | Discharge status: Home / Self Care | Payer: Self-pay

## 2019-08-01 NOTE — ED Triage Notes (Signed)
Removed 6 sutures to lt hand. No sx's for infection

## 2019-10-07 ENCOUNTER — Encounter (HOSPITAL_COMMUNITY): Payer: Self-pay

## 2019-10-07 ENCOUNTER — Other Ambulatory Visit: Payer: Self-pay

## 2019-10-07 ENCOUNTER — Ambulatory Visit (HOSPITAL_COMMUNITY)
Admission: EM | Admit: 2019-10-07 | Discharge: 2019-10-07 | Disposition: A | Discharge status: Home / Self Care | Payer: Self-pay | Attending: Family Medicine | Admitting: Family Medicine

## 2019-10-07 DIAGNOSIS — A6920 Lyme disease, unspecified: Secondary | ICD-10-CM

## 2019-10-07 DIAGNOSIS — I10 Essential (primary) hypertension: Secondary | ICD-10-CM

## 2019-10-07 DIAGNOSIS — Z760 Encounter for issue of repeat prescription: Secondary | ICD-10-CM

## 2019-10-07 MED ORDER — LISINOPRIL-HYDROCHLOROTHIAZIDE 10-12.5 MG PO TABS: 1.0000 | ORAL_TABLET | Freq: Every day | ORAL | 0 refills | Status: DC

## 2019-10-07 MED ORDER — DOXYCYCLINE HYCLATE 100 MG PO CAPS: 100.0000 mg | ORAL_CAPSULE | Freq: Two times a day (BID) | ORAL | 0 refills | Status: DC

## 2019-10-07 NOTE — Discharge Instructions (Signed)
Take antibiotic 2 times a day Take with food Consider taking a probiotic if it upsets her stomach Take blood pressure medication once a day Continue checking her blood pressure  Follow-up with your primary care doctor

## 2019-10-07 NOTE — ED Provider Notes (Signed)
[place]    CSN: [id number] Arrival date & time: 10/07/19  1549      History   Chief Complaint Chief Complaint  Patient presents with  . Rash  . Medication Refill    HPI Timothy Moore is a 54 y.o. male.   HPI  Patient has a tick bite on his left side.  He found the tick about a week ago.  He has a rash that is getting bigger.  It does itch.  No other symptoms.  No fever chills, body aches, joint aches  Past Medical History:  Diagnosis Date  . Hypertension     There are no problems to display for this patient.   History reviewed. No pertinent surgical history.     Home Medications    Prior to Admission medications   Medication Sig Start Date End Date Taking? Authorizing Provider  doxycycline (VIBRAMYCIN) 100 MG capsule Take 1 capsule (100 mg total) by mouth 2 (two) times daily. 10/07/19   Raylene Everts, MD  lisinopril (ZESTRIL) 10 MG tablet Take 1 tablet (10 mg total) by mouth daily. 12/17/18   Raylene Everts, MD  lisinopril-hydrochlorothiazide (ZESTORETIC) 10-12.5 MG tablet Take 1 tablet by mouth daily. 10/07/19   Raylene Everts, MD  Multiple Vitamin (MULTIVITAMIN) tablet Take 1 tablet by mouth daily.    [provider]    Family History Family History  Problem Relation Age of Onset  . Kidney Stones Mother   . Cancer Father     Social History Social History   Tobacco Use  . Smoking status: Never Smoker  . Smokeless tobacco: Never Used  Substance Use Topics  . Alcohol use: Yes  . Drug use: Never     Allergies   Patient has no known allergies.   Review of Systems Review of Systems  See HPI Physical Exam Triage Vital Signs ED Triage Vitals  Enc Vitals Group     BP 10/07/19 1720 (!) 138/102     Pulse Rate 10/07/19 1720 65     Resp 10/07/19 1720 18     Temp 10/07/19 1720 98.5 F (36.9 C)     Temp Source 10/07/19 1720 Oral     SpO2 10/07/19 1720 99 %     Weight 10/07/19 1731 170 lb (77.1 kg)     Height  10/07/19 1731 5\' 5"  (1.651 m)     Head Circumference --      Peak Flow --      Pain Score 10/07/19 1731 0     Pain Loc --      Pain Edu? --      Excl. in [place]? --    No data found.  Updated Vital Signs BP (!) 138/102 (BP Location: Right Arm)   Pulse 65   Temp 98.5 F (36.9 C) (Oral)   Resp 18   Ht 5\' 5"  (1.651 m)   Wt 77.1 kg   SpO2 99%   BMI 28.29 kg/m         Physical Exam Constitutional:      General: He is not in acute distress.    Appearance: He is well-developed.  HENT:     Head: Normocephalic and atraumatic.  Eyes:     Conjunctiva/sclera: Conjunctivae normal.     Pupils: Pupils are equal, round, and reactive to light.  Cardiovascular:     Rate and Rhythm: Normal rate.  Pulmonary:     Effort: Pulmonary effort is normal. No respiratory distress.  Abdominal:  General: There is no distension.     Palpations: Abdomen is soft.  Musculoskeletal:        General: Normal range of motion.     Cervical back: Normal range of motion.  Skin:    General: Skin is warm and dry.     Comments: Bull's-eye lesion on the left flank.  10 x 15 cm.  See photo  Neurological:     Mental Status: He is alert.  Psychiatric:        Mood and Affect: Mood normal.        Behavior: Behavior normal.        UC Treatments / Results  Labs (all labs ordered are listed, but only abnormal results are displayed) Labs Reviewed - No data to display  EKG   Radiology No results found.  Procedures Procedures (including critical care time)  Medications Ordered in UC Medications - No data to display  Initial Impression / Assessment and Plan / UC Course  I have reviewed the triage vital signs and the nursing notes.  Pertinent labs & imaging results that were available during my care of the patient were reviewed by me and considered in my medical decision making (see chart for details).     Treat with 3 weeks of doxycycline.  May take Benadryl for itching. Patient is out of his  blood pressure medicine.  It is refilled Final Clinical Impressions(s) / UC Diagnoses   Final diagnoses:  Acute Lyme disease with erythema migrans lesion 5 cm or greater in diameter  Essential hypertension  Medication refill     Discharge Instructions     Take antibiotic 2 times a day Take with food Consider taking a probiotic if it upsets her stomach Take blood pressure medication once a day Continue checking her blood pressure  Follow-up with your primary care doctor    ED Prescriptions    Medication Sig Dispense Auth. Provider   doxycycline (VIBRAMYCIN) 100 MG capsule Take 1 capsule (100 mg total) by mouth 2 (two) times daily. 42 capsule Raylene Everts, MD   lisinopril-hydrochlorothiazide (ZESTORETIC) 10-12.5 MG tablet Take 1 tablet by mouth daily. 90 tablet Raylene Everts, MD     PDMP not reviewed this encounter.   Raylene Everts, MD 10/07/19 2217

## 2019-10-07 NOTE — ED Triage Notes (Signed)
Pt is here with a tick bite last week on his left side, pt needs his Lisinopril 10MG  to be refilled, pt ran out Thursday.

## 2020-02-12 ENCOUNTER — Other Ambulatory Visit: Payer: Self-pay

## 2020-02-12 ENCOUNTER — Ambulatory Visit: Payer: No Typology Code available for payment source | Admitting: Hospice and Palliative Medicine

## 2020-02-12 ENCOUNTER — Encounter: Payer: Self-pay | Admitting: Hospice and Palliative Medicine

## 2020-02-12 VITALS — BP 146/110 | HR 93 | Temp 98.2°F | Resp 16 | Ht 65.0 in | Wt 170.2 lb

## 2020-02-12 DIAGNOSIS — R634 Abnormal weight loss: Secondary | ICD-10-CM | POA: Diagnosis not present

## 2020-02-12 DIAGNOSIS — Z7689 Persons encountering health services in other specified circumstances: Secondary | ICD-10-CM | POA: Diagnosis not present

## 2020-02-12 DIAGNOSIS — I10 Essential (primary) hypertension: Secondary | ICD-10-CM | POA: Diagnosis not present

## 2020-02-12 DIAGNOSIS — H539 Unspecified visual disturbance: Secondary | ICD-10-CM

## 2020-02-12 DIAGNOSIS — K219 Gastro-esophageal reflux disease without esophagitis: Secondary | ICD-10-CM

## 2020-02-12 DIAGNOSIS — Z1211 Encounter for screening for malignant neoplasm of colon: Secondary | ICD-10-CM

## 2020-02-12 DIAGNOSIS — Z125 Encounter for screening for malignant neoplasm of prostate: Secondary | ICD-10-CM

## 2020-02-12 MED ORDER — OMEPRAZOLE 20 MG PO CPDR: 20.0000 mg | DELAYED_RELEASE_CAPSULE | Freq: Every day | ORAL | 3 refills | Status: DC

## 2020-02-12 MED ORDER — LOSARTAN POTASSIUM 50 MG PO TABS: 50.0000 mg | ORAL_TABLET | Freq: Every day | ORAL | 3 refills | Status: DC

## 2020-02-12 NOTE — Progress Notes (Signed)
[facility] [place], [place] [address]  Internal MEDICINE  Office Visit Note  Patient Name: Timothy Moore  [id number]  [id number]  Date of Service: 02/17/2020   Complaints/HPI Pt is here for establishment of PCP. Chief Complaint  Patient presents with  . New Patient (Initial Visit)    BP, discuss meds, tired, losing weight, eye sight is getting bad, gout  . Hypertension  . policy update form    received  . Gastroesophageal Reflux  . Quality Metric Gaps    colonoscopy   HPI Patient is here to establishment care with PCP -Last examination by a doctor was while he was incarcerated about 2 years ago, was released from prison a little over a year ago after being incarcerated for 19 years -He is now working full-time as Company secretary -He has adjusted without issues or complications to being released from prison -Over the last year, he has made trips to the emergency department for minor complaints due to not having a PCP--this has prompted him to establish care today to avoid further visits to the hospital -He has not been taking BP medications for a few months-was told that lisinopril can cause facial swelling in African Americans -He has noticed changes in his vision since stopping his BP medications, denies headaches or chest pain -Over the last few months he has also noticed that he has been losing weight, no change in his appetite or eating habits, denies changes in his bowel regimen, no bright red blood or tarry stools noted -Has also been experiencing symptoms of GERD-burning in the back of his throat when lying flat and increased belching  No issues with sleeping, feels rested during the day, denies tobacco, alcohol or drug use Has 2 daughters that live locally he is in contact with Has never had a colonoscopy  Current Medication: Outpatient Encounter Medications as of 02/12/2020  Medication Sig  . Multiple Vitamin (MULTIVITAMIN) tablet Take 1  tablet by mouth daily.  . [DISCONTINUED] lisinopril (ZESTRIL) 10 MG tablet Take 1 tablet (10 mg total) by mouth daily.  . [DISCONTINUED] lisinopril-hydrochlorothiazide (ZESTORETIC) 10-12.5 MG tablet Take 1 tablet by mouth daily.  Marland Kitchen losartan (COZAAR) 50 MG tablet Take 1 tablet (50 mg total) by mouth daily.  Marland Kitchen omeprazole (PRILOSEC) 20 MG capsule Take 1 capsule (20 mg total) by mouth daily.  . [DISCONTINUED] doxycycline (VIBRAMYCIN) 100 MG capsule Take 1 capsule (100 mg total) by mouth 2 (two) times daily. (Patient not taking: Reported on 02/12/2020)   No facility-administered encounter medications on file as of 02/12/2020.    Surgical History: History reviewed. No pertinent surgical history.  Medical History: Past Medical History:  Diagnosis Date  . GERD (gastroesophageal reflux disease)   . Hypertension     Family History: Family History  Problem Relation Age of Onset  . Kidney Stones Mother   . Cancer Father   . Lupus Sister   . Cancer Maternal Grandmother   . Cancer Paternal Grandmother     Social History   Socioeconomic History  . Marital status: Married    Spouse name: Not on file  . Number of children: Not on file  . Years of education: Not on file  . Highest education level: Not on file  Occupational History  . Not on file  Tobacco Use  . Smoking status: Former Research scientist (life sciences)  . Smokeless tobacco: Never Used  . Tobacco comment: quit 11 years ago   Substance and Sexual Activity  . Alcohol use: Yes  Comment: 2 shots a month  . Drug use: Never    Comment: marijuana 20 years ago  . Sexual activity: Yes    Birth control/protection: Condom  Other Topics Concern  . Not on file  Social History Narrative  . Not on file   Social Determinants of Health   Financial Resource Strain:   . Difficulty of Paying Living Expenses: Not on file  Food Insecurity:   . Worried About Charity fundraiser in the Last Year: Not on file  . Ran Out of Food in the Last Year: Not on file   Transportation Needs:   . Lack of Transportation (Medical): Not on file  . Lack of Transportation (Non-Medical): Not on file  Physical Activity:   . Days of Exercise per Week: Not on file  . Minutes of Exercise per Session: Not on file  Stress:   . Feeling of Stress : Not on file  Social Connections:   . Frequency of Communication with Friends and Family: Not on file  . Frequency of Social Gatherings with Friends and Family: Not on file  . Attends Religious Services: Not on file  . Active Member of Clubs or Organizations: Not on file  . Attends Archivist Meetings: Not on file  . Marital Status: Not on file  Intimate Partner Violence:   . Fear of Current or Ex-Partner: Not on file  . Emotionally Abused: Not on file  . Physically Abused: Not on file  . Sexually Abused: Not on file     Review of Systems  Constitutional: Positive for unexpected weight change. Negative for chills and fatigue.  HENT: Negative for congestion, postnasal drip, rhinorrhea, sneezing and sore throat.   Eyes: Positive for visual disturbance. Negative for photophobia and redness.  Respiratory: Negative for cough, chest tightness and shortness of breath.   Cardiovascular: Negative for chest pain, palpitations and leg swelling.  Gastrointestinal: Negative for abdominal pain, constipation, diarrhea, nausea and vomiting.       Acid reflux, increased belching  Genitourinary: Negative for dysuria and frequency.  Musculoskeletal: Negative for arthralgias, back pain, joint swelling and neck pain.  Skin: Negative for rash.  Neurological: Negative.  Negative for tremors and numbness.  Hematological: Negative for adenopathy. Does not bruise/bleed easily.  Psychiatric/Behavioral: Negative for behavioral problems (Depression), sleep disturbance and suicidal ideas. The patient is not nervous/anxious.     Vital Signs: BP (!) 146/110   Pulse 93   Temp 98.2 F (36.8 C)   Resp 16   Ht 5\' 5"  (1.651 m)   Wt  170 lb 3.2 oz (77.2 kg)   SpO2 97%   BMI 28.32 kg/m    Physical Exam Vitals reviewed.  Constitutional:      Appearance: Normal appearance. He is normal weight.  Eyes:     Pupils: Pupils are equal, round, and reactive to light.  Cardiovascular:     Rate and Rhythm: Normal rate and regular rhythm.     Pulses: Normal pulses.     Heart sounds: Normal heart sounds.  Pulmonary:     Effort: Pulmonary effort is normal.     Breath sounds: Normal breath sounds.  Abdominal:     General: Abdomen is flat.     Palpations: Abdomen is soft.  Musculoskeletal:        General: Normal range of motion.     Cervical back: Normal range of motion.  Skin:    General: Skin is warm.  Neurological:     General:  No focal deficit present.     Mental Status: He is alert and oriented to person, place, and time. Mental status is at baseline.  Psychiatric:        Mood and Affect: Mood normal.        Behavior: Behavior normal.        Thought Content: Thought content normal.        Judgment: Judgment normal.    Assessment/Plan: 1. Encounter to establish care with new doctor Will review routine labs and establish baseline, will adjust care as indicated - CBC w/Diff/Platelet - Comprehensive Metabolic Panel (CMET) - Lipid Panel With LDL/HDL Ratio - TSH + free T4  2. Essential hypertension Start losartan for markedly elevated DBP, will monitor response to therapy and adjust as needed - losartan (COZAAR) 50 MG tablet; Take 1 tablet (50 mg total) by mouth daily.  Dispense: 90 tablet; Refill: 3 - Comprehensive Metabolic Panel (CMET) - Lipid Panel With LDL/HDL Ratio  3. Gastroesophageal reflux disease without esophagitis Start omeprazole and referral to GI, possible endoscopy needed - omeprazole (PRILOSEC) 20 MG capsule; Take 1 capsule (20 mg total) by mouth daily.  Dispense: 30 capsule; Refill: 3 - Ambulatory referral to Gastroenterology  4. Unintentional weight loss GI referral, screening colonoscopy  due to weight loss, will also assess labs for indicators of unintentional weight loss - Ambulatory referral to Gastroenterology - CBC w/Diff/Platelet - TSH + free T4 - PSA  5. Screening for colon cancer - Ambulatory referral to Gastroenterology  6. Visual disturbance Potentially linked to markedly elevated BP--has not had eye exam in several years - Ambulatory referral to Ophthalmology  7. Screening for prostate cancer - PSA  General Counseling: Timothy Moore understanding of the findings of todays visit and agrees with plan of treatment. I have discussed any further diagnostic evaluation that may be needed or ordered today. We also reviewed his medications today. he has been encouraged to call the office with any questions or concerns that should arise related to todays visit.  Orders Placed This Encounter  Procedures  . CBC w/Diff/Platelet  . Comprehensive Metabolic Panel (CMET)  . Lipid Panel With LDL/HDL Ratio  . TSH + free T4  . PSA  . Ambulatory referral to Gastroenterology  . Ambulatory referral to Ophthalmology    Meds ordered this encounter  Medications  . losartan (COZAAR) 50 MG tablet    Sig: Take 1 tablet (50 mg total) by mouth daily.    Dispense:  90 tablet    Refill:  3  . omeprazole (PRILOSEC) 20 MG capsule    Sig: Take 1 capsule (20 mg total) by mouth daily.    Dispense:  30 capsule    Refill:  3    Time spent: 40 Minutes Time spent includes review of chart, medications, test results and follow-up plan with the patient.  This patient was seen by Orson Gear AGNP-C in Collaboration with Dr Lavera Guise as a part of collaborative care agreement  Timothy Moore AGNP-C Internal Medicine

## 2020-02-17 ENCOUNTER — Encounter: Payer: Self-pay | Admitting: Hospice and Palliative Medicine

## 2020-03-19 ENCOUNTER — Encounter: Payer: Self-pay | Admitting: Hospice and Palliative Medicine

## 2020-03-19 ENCOUNTER — Other Ambulatory Visit: Payer: Self-pay

## 2020-03-19 ENCOUNTER — Ambulatory Visit (INDEPENDENT_AMBULATORY_CARE_PROVIDER_SITE_OTHER): Payer: No Typology Code available for payment source | Admitting: Hospice and Palliative Medicine

## 2020-03-19 VITALS — BP 148/103 | HR 83 | Temp 98.0°F | Wt 170.2 lb

## 2020-03-19 DIAGNOSIS — K219 Gastro-esophageal reflux disease without esophagitis: Secondary | ICD-10-CM | POA: Diagnosis not present

## 2020-03-19 DIAGNOSIS — R300 Dysuria: Secondary | ICD-10-CM

## 2020-03-19 DIAGNOSIS — M79644 Pain in right finger(s): Secondary | ICD-10-CM

## 2020-03-19 DIAGNOSIS — Z0001 Encounter for general adult medical examination with abnormal findings: Secondary | ICD-10-CM

## 2020-03-19 DIAGNOSIS — I10 Essential (primary) hypertension: Secondary | ICD-10-CM | POA: Diagnosis not present

## 2020-03-19 DIAGNOSIS — M109 Gout, unspecified: Secondary | ICD-10-CM

## 2020-03-19 DIAGNOSIS — M79645 Pain in left finger(s): Secondary | ICD-10-CM

## 2020-03-19 MED ORDER — AMLODIPINE BESYLATE 5 MG PO TABS: 5.0000 mg | ORAL_TABLET | Freq: Every day | ORAL | 3 refills | Status: DC

## 2020-03-19 NOTE — Progress Notes (Signed)
[facility] [place], [place] [address]  Internal MEDICINE  Office Visit Note  Patient Name: Timothy Moore  [id number]  [id number]  Date of Service: 03/22/2020  Chief Complaint  Patient presents with  . Annual Exam  . thumb pain    In both hands, feels like gout.  . Quality Metric Gaps    Colonoscopy     HPI Pt is here for routine health maintenance examination Overall, things have been going well  Thinks he might have gout, strong family history of gout and gouty arthritis Complaining of bilateral thumb pain, pain has been going on for some time but seems to be getting worse, joint space locks up at times Denies overuse of thumbs, physical work but not repetitive movement with thumbs   Scheduled to see GI in the next week or so to be scheduled for colonoscopy  Has not been able to have his labs drawn yet  Discussed his elevated BP--has been taking his medications, denies headaches or chest pain Vision continues to be blurred but denies acute changes in vision or floaters GERD symptoms have improved since starting omeprazole   Current Medication: Outpatient Encounter Medications as of 03/19/2020  Medication Sig  . amLODipine (NORVASC) 5 MG tablet Take 1 tablet (5 mg total) by mouth daily.  Marland Kitchen losartan (COZAAR) 50 MG tablet Take 1 tablet (50 mg total) by mouth daily.  . Multiple Vitamin (MULTIVITAMIN) tablet Take 1 tablet by mouth daily.  Marland Kitchen omeprazole (PRILOSEC) 20 MG capsule Take 1 capsule (20 mg total) by mouth daily.   No facility-administered encounter medications on file as of 03/19/2020.    Surgical History: History reviewed. No pertinent surgical history.  Medical History: Past Medical History:  Diagnosis Date  . GERD (gastroesophageal reflux disease)   . Hypertension     Family History: Family History  Problem Relation Age of Onset  . Kidney Stones Mother   . Cancer Father   . Lupus Sister   . Cancer Maternal Grandmother   .  Cancer Paternal Grandmother    Review of Systems  Constitutional: Negative for chills, fatigue and unexpected weight change.  HENT: Negative for congestion, postnasal drip, rhinorrhea, sneezing and sore throat.   Eyes: Positive for visual disturbance. Negative for photophobia and redness.  Respiratory: Negative for cough, chest tightness and shortness of breath.   Cardiovascular: Negative for chest pain, palpitations and leg swelling.  Gastrointestinal: Negative for abdominal pain, constipation, diarrhea, nausea and vomiting.  Genitourinary: Negative for dysuria and frequency.  Musculoskeletal: Negative for arthralgias, back pain, joint swelling and neck pain.  Skin: Negative for rash.  Neurological: Negative for dizziness, tremors, light-headedness, numbness and headaches.  Hematological: Negative for adenopathy. Does not bruise/bleed easily.  Psychiatric/Behavioral: Negative for behavioral problems (Depression), sleep disturbance and suicidal ideas. The patient is not nervous/anxious.      Vital Signs: BP (!) 148/103   Pulse 83   Temp 98 F (36.7 C)   Wt 170 lb 3.2 oz (77.2 kg)   SpO2 97%   BMI 28.32 kg/m    Physical Exam Vitals reviewed.  Constitutional:      Appearance: Normal appearance. He is normal weight.  HENT:     Right Ear: Tympanic membrane normal.     Left Ear: Tympanic membrane normal.     Nose: Nose normal.     Mouth/Throat:     Mouth: Mucous membranes are moist.     Pharynx: Oropharynx is clear.  Eyes:  Pupils: Pupils are equal, round, and reactive to light.  Cardiovascular:     Rate and Rhythm: Normal rate and regular rhythm.     Pulses: Normal pulses.     Heart sounds: Normal heart sounds.  Pulmonary:     Effort: Pulmonary effort is normal.     Breath sounds: Normal breath sounds.  Abdominal:     General: Abdomen is flat.  Musculoskeletal:        General: Normal range of motion.     Cervical back: Normal range of motion.  Skin:    General:  Skin is warm.  Neurological:     General: No focal deficit present.     Mental Status: He is alert and oriented to person, place, and time. Mental status is at baseline.  Psychiatric:        Mood and Affect: Mood normal.        Behavior: Behavior normal.        Thought Content: Thought content normal.        Judgment: Judgment normal.    LABS: Recent Results (from the past 2160 hour(s))  UA/M w/rflx Culture, Routine     Status: Abnormal   Collection Time: 03/19/20  4:32 PM   Specimen: Urine   Urine  Result Value Ref Range   Specific Gravity, UA 1.026 1.005 - 1.030   pH, UA 6.5 5.0 - 7.5   Color, UA Yellow Yellow   Appearance Ur Clear Clear   Leukocytes,UA Trace (A) Negative   Protein,UA Negative Negative/Trace   Glucose, UA Negative Negative   Ketones, UA Trace (A) Negative   RBC, UA Negative Negative   Bilirubin, UA Negative Negative   Urobilinogen, Ur 0.2 0.2 - 1.0 mg/dL   Nitrite, UA Negative Negative   Microscopic Examination See below:     Comment: Microscopic was indicated and was performed.   Urinalysis Reflex Comment     Comment: This specimen has reflexed to a Urine Culture.  Microscopic Examination     Status: None   Collection Time: 03/19/20  4:32 PM   Urine  Result Value Ref Range   WBC, UA None seen 0 - 5 /hpf   RBC None seen 0 - 2 /hpf   Epithelial Cells (non renal) None seen 0 - 10 /hpf   Casts None seen None seen /lpf   Bacteria, UA None seen None seen/Few  Urine Culture, Reflex     Status: None   Collection Time: 03/19/20  4:32 PM   Urine  Result Value Ref Range   Urine Culture, Routine Final report    Organism ID, Bacteria Comment     Comment: Mixed urogenital flora Less than 10,000 colonies/mL   Uric acid     Status: None   Collection Time: 03/20/20  8:14 AM  Result Value Ref Range   Uric Acid 6.9 3.8 - 8.4 mg/dL    Comment:            Therapeutic target for gout patients: <6.0     Assessment/Plan: 1. Encounter for routine adult health  examination with abnormal findings Well appearing 55 year old AA male Encouraged to have labs drawn GI appointment scheduled within next few weeks to have colonoscopy  2. Essential hypertension Add amlodipine and continue losartan for improved BP management Discussed importance of BP control - amLODipine (NORVASC) 5 MG tablet; Take 1 tablet (5 mg total) by mouth daily.  Dispense: 90 tablet; Refill: 3  3. Bilateral thumb pain Unlikely gout due  to pain affecting bilateral joint spaces, will review uric acid level due to family history, encouraged to use acetaminophen/ibuprofen for pain relief--if pain persists consider xray - Uric acid  4. Gastroesophageal reflux disease without esophagitis Symptoms improved since starting omeprazole, continue with therapy and discuss symptome with GI  5. Dysuria - UA/M w/rflx Culture, Routine - Microscopic Examination - Urine Culture, Reflex  General Counseling: donivin kosterman understanding of the findings of todays visit and agrees with plan of treatment. I have discussed any further diagnostic evaluation that may be needed or ordered today. We also reviewed his medications today. he has been encouraged to call the office with any questions or concerns that should arise related to todays visit.    Counseling: Hypertension Counseling:   The following hypertensive lifestyle modification were recommended and discussed:  1. Limiting alcohol intake to less than 1 oz/day of ethanol:(24 oz of beer or 8 oz of wine or 2 oz of 100-proof whiskey). 2. Take baby ASA 81 mg daily. 3. Importance of regular aerobic exercise and losing weight. 4. Reduce dietary saturated fat and cholesterol intake for overall cardiovascular health. 5. Maintaining adequate dietary potassium, calcium, and magnesium intake. 6. Regular monitoring of the blood pressure. 7. Reduce sodium intake to less than 100 mmol/day (less than 2.3 gm of sodium or less than 6 gm of sodium choride)     Orders Placed This Encounter  Procedures  . Microscopic Examination  . Urine Culture, Reflex  . UA/M w/rflx Culture, Routine  . Uric acid    Meds ordered this encounter  Medications  . amLODipine (NORVASC) 5 MG tablet    Sig: Take 1 tablet (5 mg total) by mouth daily.    Dispense:  90 tablet    Refill:  3    Total time spent: 30 Minutes  Time spent includes review of chart, medications, test results, and follow up plan with the patient.   This patient was seen by Casey Burkitt AGNP-C Collaboration with Dr Lavera Guise as a part of collaborative care agreement   [name]. [facility] Internal Medicine

## 2020-03-21 LAB — URIC ACID: Uric Acid: 6.9 mg/dL (ref 3.8–8.4)

## 2020-03-22 ENCOUNTER — Encounter: Payer: Self-pay | Admitting: Hospice and Palliative Medicine

## 2020-03-22 LAB — UA/M W/RFLX CULTURE, ROUTINE
Bilirubin, UA: NEGATIVE
Glucose, UA: NEGATIVE
Nitrite, UA: NEGATIVE
Protein,UA: NEGATIVE
RBC, UA: NEGATIVE
Specific Gravity, UA: 1.026 (ref 1.005–1.030)
Urobilinogen, Ur: 0.2 mg/dL (ref 0.2–1.0)
pH, UA: 6.5 (ref 5.0–7.5)

## 2020-03-22 LAB — URINE CULTURE, REFLEX

## 2020-03-22 LAB — MICROSCOPIC EXAMINATION
Bacteria, UA: NONE SEEN
Casts: NONE SEEN /lpf
Epithelial Cells (non renal): NONE SEEN /hpf (ref 0–10)
RBC, Urine: NONE SEEN /hpf (ref 0–2)
WBC, UA: NONE SEEN /hpf (ref 0–5)

## 2020-03-23 ENCOUNTER — Other Ambulatory Visit: Payer: Self-pay

## 2020-03-23 ENCOUNTER — Telehealth: Payer: Self-pay

## 2020-03-23 ENCOUNTER — Other Ambulatory Visit: Payer: Self-pay | Admitting: Hospice and Palliative Medicine

## 2020-03-23 NOTE — Telephone Encounter (Signed)
PT wanted to change pharmacies

## 2020-03-23 NOTE — Telephone Encounter (Signed)
Spoke with pt labs slip ready for pickup and need 2 weeks follow up to see taylor

## 2020-03-25 LAB — COMPREHENSIVE METABOLIC PANEL
ALT: 24 IU/L (ref 0–44)
AST: 38 IU/L (ref 0–40)
Albumin/Globulin Ratio: 1.7 (ref 1.2–2.2)
Albumin: 4.7 g/dL (ref 3.8–4.9)
Alkaline Phosphatase: 110 IU/L (ref 44–121)
BUN/Creatinine Ratio: 10 (ref 9–20)
BUN: 11 mg/dL (ref 6–24)
Bilirubin Total: 0.6 mg/dL (ref 0.0–1.2)
CO2: 21 mmol/L (ref 20–29)
Calcium: 9.7 mg/dL (ref 8.7–10.2)
Chloride: 101 mmol/L (ref 96–106)
Creatinine, Ser: 1.11 mg/dL (ref 0.76–1.27)
GFR calc Af Amer: 88 mL/min/{1.73_m2} (ref >59)
GFR calc non Af Amer: 76 mL/min/{1.73_m2} (ref >59)
Globulin, Total: 2.8 g/dL (ref 1.5–4.5)
Glucose: 109 mg/dL — ABNORMAL HIGH (ref 65–99)
Potassium: 4.4 mmol/L (ref 3.5–5.2)
Sodium: 137 mmol/L (ref 134–144)
Total Protein: 7.5 g/dL (ref 6.0–8.5)

## 2020-03-25 LAB — TSH+FREE T4
Free T4: 1.25 ng/dL (ref 0.82–1.77)
TSH: 1.7 u[IU]/mL (ref 0.450–4.500)

## 2020-03-25 LAB — LIPID PANEL WITH LDL/HDL RATIO
Cholesterol, Total: 239 mg/dL — ABNORMAL HIGH (ref 100–199)
HDL: 56 mg/dL (ref >39)
LDL Chol Calc (NIH): 162 mg/dL — ABNORMAL HIGH (ref 0–99)
LDL/HDL Ratio: 2.9 ratio (ref 0.0–3.6)
Triglycerides: 116 mg/dL (ref 0–149)
VLDL Cholesterol Cal: 21 mg/dL (ref 5–40)

## 2020-03-25 LAB — CBC WITH DIFFERENTIAL/PLATELET
Basophils Absolute: 0 10*3/uL (ref 0.0–0.2)
Basos: 1 %
EOS (ABSOLUTE): 0.1 10*3/uL (ref 0.0–0.4)
Eos: 1 %
Hematocrit: 42.1 % (ref 37.5–51.0)
Hemoglobin: 14.7 g/dL (ref 13.0–17.7)
Immature Grans (Abs): 0 10*3/uL (ref 0.0–0.1)
Immature Granulocytes: 0 %
Lymphocytes Absolute: 1.5 10*3/uL (ref 0.7–3.1)
Lymphs: 25 %
MCH: 29.2 pg (ref 26.6–33.0)
MCHC: 34.9 g/dL (ref 31.5–35.7)
MCV: 84 fL (ref 79–97)
Monocytes Absolute: 0.6 10*3/uL (ref 0.1–0.9)
Monocytes: 10 %
Neutrophils Absolute: 3.8 10*3/uL (ref 1.4–7.0)
Neutrophils: 63 %
Platelets: 284 10*3/uL (ref 150–450)
RBC: 5.03 x10E6/uL (ref 4.14–5.80)
RDW: 13.2 % (ref 11.6–15.4)
WBC: 6 10*3/uL (ref 3.4–10.8)

## 2020-03-25 LAB — PSA: Prostate Specific Ag, Serum: 0.9 ng/mL (ref 0.0–4.0)

## 2020-03-27 NOTE — Progress Notes (Signed)
Labs reviewed, will discuss at next follow-up visit.

## 2020-04-01 ENCOUNTER — Ambulatory Visit: Payer: Self-pay | Admitting: Gastroenterology

## 2020-04-03 ENCOUNTER — Encounter: Payer: Self-pay | Admitting: Hospice and Palliative Medicine

## 2020-04-04 ENCOUNTER — Encounter: Payer: Self-pay | Admitting: Hospice and Palliative Medicine

## 2020-04-07 ENCOUNTER — Encounter: Payer: Self-pay | Admitting: Hospice and Palliative Medicine

## 2020-04-08 ENCOUNTER — Ambulatory Visit
Admission: EM | Admit: 2020-04-08 | Discharge: 2020-04-08 | Disposition: A | Discharge status: Home / Self Care | Payer: No Typology Code available for payment source | Attending: Physician Assistant | Admitting: Physician Assistant

## 2020-04-08 ENCOUNTER — Ambulatory Visit (INDEPENDENT_AMBULATORY_CARE_PROVIDER_SITE_OTHER): Payer: No Typology Code available for payment source

## 2020-04-08 ENCOUNTER — Encounter: Payer: Self-pay | Admitting: Emergency Medicine

## 2020-04-08 ENCOUNTER — Other Ambulatory Visit: Payer: Self-pay

## 2020-04-08 ENCOUNTER — Encounter: Payer: Self-pay | Admitting: Hospice and Palliative Medicine

## 2020-04-08 DIAGNOSIS — Z8616 Personal history of COVID-19: Secondary | ICD-10-CM | POA: Diagnosis not present

## 2020-04-08 DIAGNOSIS — R5383 Other fatigue: Secondary | ICD-10-CM

## 2020-04-08 DIAGNOSIS — U071 COVID-19: Secondary | ICD-10-CM

## 2020-04-08 DIAGNOSIS — J1282 Pneumonia due to coronavirus disease 2019: Secondary | ICD-10-CM

## 2020-04-08 DIAGNOSIS — R630 Anorexia: Secondary | ICD-10-CM | POA: Diagnosis not present

## 2020-04-08 DIAGNOSIS — R059 Cough, unspecified: Secondary | ICD-10-CM

## 2020-04-08 DIAGNOSIS — R0602 Shortness of breath: Secondary | ICD-10-CM | POA: Diagnosis not present

## 2020-04-08 MED ORDER — ALBUTEROL SULFATE HFA 108 (90 BASE) MCG/ACT IN AERS: 1.0000 | INHALATION_SPRAY | RESPIRATORY_TRACT | 0 refills | Status: DC | PRN

## 2020-04-08 MED ORDER — PREDNISONE 20 MG PO TABS: 40.0000 mg | ORAL_TABLET | Freq: Every day | ORAL | 0 refills | Status: AC

## 2020-04-08 NOTE — ED Triage Notes (Signed)
Patient c/o shortness of breath, fatigue, weakness, cough that started 3 weeks ago. He was positive for COVID 3 weeks ago and states over the last week his symptoms have worsened.

## 2020-04-08 NOTE — ED Provider Notes (Signed)
[facility]    CSN: [id number] Arrival date & time: 04/08/20  1240      History   Chief Complaint Chief Complaint  Patient presents with  . Cough  . Shortness of Breath  . Fatigue    HPI Timothy Moore is a 55 y.o. male presenting for 3-week history of fatigue and weakness, cough, congestion feeling short of breath.  Patient states that he was diagnosed with COVID-19 about 3 weeks ago.  He says that he felt he was getting better until about a week ago when symptoms got worse.  He has not been vaccinated against COVID-19.  Patient has had temperatures up to 101 degrees over the past couple of days.  He says been taking Tylenol.  He says that cough is occasionally productive of brownish phlegm.  He also admits to lack of appetite and shortness of breath as well as back pain.  Denies any nausea, vomiting or diarrhea.  Admits to occasional abdominal cramping. His past medical history is significant for hypertension and GERD.  No history of cardiopulmonary disease.  Former smoker. He says he has not been taking any cough medication because they elevate his blood pressure.  He has notified PCP about his symptoms and was advised to come to urgent care for evaluation.  He has no other concerns today.   HPI  Past Medical History:  Diagnosis Date  . GERD (gastroesophageal reflux disease)   . Hypertension     There are no problems to display for this patient.   History reviewed. No pertinent surgical history.     Home Medications    Prior to Admission medications   Medication Sig Start Date End Date Taking? Authorizing Provider  albuterol (VENTOLIN HFA) 108 (90 Base) MCG/ACT inhaler Inhale 1-2 puffs into the lungs every 4 (four) hours as needed for wheezing or shortness of breath. 04/08/20 04/08/21 Yes [name] B, PA-C  amLODipine (NORVASC) 5 MG tablet Take 1 tablet (5 mg total) by mouth daily. 03/19/20  Yes Luiz Ochoa, NP  losartan (COZAAR) 50 MG tablet Take 1  tablet (50 mg total) by mouth daily. 02/12/20  Yes Luiz Ochoa, NP  Multiple Vitamin (MULTIVITAMIN) tablet Take 1 tablet by mouth daily.   Yes [provider]  omeprazole (PRILOSEC) 20 MG capsule Take 1 capsule (20 mg total) by mouth daily. 02/12/20  Yes Luiz Ochoa, NP  predniSONE (DELTASONE) 20 MG tablet Take 2 tablets (40 mg total) by mouth daily for 5 days. 04/08/20 04/13/20 Yes Danton Clap, PA-C    Family History Family History  Problem Relation Age of Onset  . Kidney Stones Mother   . Cancer Father   . Lupus Sister   . Cancer Maternal Grandmother   . Cancer Paternal Grandmother     Social History Social History   Tobacco Use  . Smoking status: Former Research scientist (life sciences)  . Smokeless tobacco: Never Used  . Tobacco comment: quit 11 years ago   Substance Use Topics  . Alcohol use: Yes    Comment: 2 shots a month  . Drug use: Never    Comment: marijuana 20 years ago     Allergies   Patient has no known allergies.   Review of Systems Review of Systems  Constitutional: Positive for appetite change, fatigue and fever.  HENT: Negative for congestion, rhinorrhea, sinus pressure, sinus pain and sore throat.   Respiratory: Positive for cough and shortness of breath.   Cardiovascular: Negative for chest pain.  Gastrointestinal: Negative for abdominal pain, diarrhea, nausea and vomiting.  Musculoskeletal: Positive for back pain. Negative for myalgias.  Neurological: Positive for weakness. Negative for light-headedness and headaches.  Hematological: Negative for adenopathy.     Physical Exam Triage Vital Signs ED Triage Vitals  Enc Vitals Group     BP 04/08/20 1301 129/87     Pulse Rate 04/08/20 1301 94     Resp 04/08/20 1301 20     Temp 04/08/20 1301 99.6 F (37.6 C)     Temp Source 04/08/20 1301 Oral     SpO2 04/08/20 1301 95 %     Weight 04/08/20 1302 170 lb 3.1 oz (77.2 kg)     Height 04/08/20 1302 5\' 5"  (1.651 m)     Head Circumference --      Peak  Flow --      Pain Score 04/08/20 1302 5     Pain Loc --      Pain Edu? --      Excl. in [place]? --    No data found.  Updated Vital Signs BP 129/87 (BP Location: Right Arm)   Pulse 94   Temp 99.6 F (37.6 C) (Oral)   Resp 20   Ht 5\' 5"  (1.651 m)   Wt 170 lb 3.1 oz (77.2 kg)   SpO2 95%   BMI 28.32 kg/m    Physical Exam Vitals and nursing note reviewed.  Constitutional:      General: He is not in acute distress.    Appearance: Normal appearance. He is well-developed and well-nourished. He is ill-appearing. He is not toxic-appearing.  HENT:     Head: Normocephalic and atraumatic.     Nose: Nose normal. No congestion or rhinorrhea.     Mouth/Throat:     Mouth: Mucous membranes are moist.     Pharynx: Oropharynx is clear.  Eyes:     General: No scleral icterus.    Conjunctiva/sclera: Conjunctivae normal.  Cardiovascular:     Rate and Rhythm: Normal rate and regular rhythm.     Heart sounds: Normal heart sounds.  Pulmonary:     Effort: Pulmonary effort is normal. No respiratory distress.     Breath sounds: Normal breath sounds. No wheezing, rhonchi or rales.  Musculoskeletal:        General: No edema.     Cervical back: Neck supple.  Skin:    General: Skin is warm and dry.  Neurological:     General: No focal deficit present.     Mental Status: He is alert. Mental status is at baseline.     Motor: No weakness.     Gait: Gait normal.  Psychiatric:        Mood and Affect: Mood and affect and mood normal.        Behavior: Behavior normal.        Thought Content: Thought content normal.      UC Treatments / Results  Labs (all labs ordered are listed, but only abnormal results are displayed) Labs Reviewed - No data to display  EKG   Radiology DG Chest 2 View  Result Date: 04/08/2020 CLINICAL DATA:  Continued shortness of breath, cough and fatigue since COVID-19 3 weeks ago, worsening symptoms over past week EXAM: CHEST - 2 VIEW COMPARISON:  None FINDINGS: Normal  heart size, mediastinal contours, and pulmonary vascularity. Patchy infiltrates in RIGHT lung, minimally in LEFT upper lobe as well, consistent with multifocal pneumonia. No pleural effusion or pneumothorax. Osseous structures unremarkable.  IMPRESSION: Patchy pulmonary infiltrates RIGHT lung and minimally LEFT upper lobe consistent with multifocal pneumonia and COVID-19. Electronically Signed   By: Lavonia Dana M.D.   On: 04/08/2020 13:28    Procedures Procedures (including critical care time)  Medications Ordered in UC Medications - No data to display  Initial Impression / Assessment and Plan / UC Course  I have reviewed the triage vital signs and the nursing notes.  Pertinent labs & imaging results that were available during my care of the patient were reviewed by me and considered in my medical decision making (see chart for details).   55 year old male presenting for 3-week history of cough, fatigue, and shortness of breath.  Symptoms worsened over the past week.  In the clinic today, blood pressure is normal.  Temperature elevated at 99.6 degrees.  Pulse is 94 bpm and oxygen saturation is 95%.  He is ill-appearing on exam, but not toxic.  The rest of exam is within normal limits.  His chest is clear to auscultation heart regular rate and rhythm.  Chest x-ray obtained today given duration of symptoms.  Concern for Covid pneumonia.  Chest x-ray reviewed independently by me and patchy infiltrates noted in the right lung.  Over read by radiologist supports patchy infiltrates of the right lung and also notes minimal patchy infiltrates of the left upper lobe.  States this is consistent with multifocal pneumonia.  Advised patient of chest x-ray results.  Advised him that he has Covid pneumonia.  Advised that care is supportive with increasing rest and fluids.  Advised him to take over-the-counter Coricidin HBP for cough.  I did send prednisone which I explained can elevate his blood pressure.  Also  sent an inhaler.  Advised him to reach out to his PCP to let them know about his visit here today.  ED precautions reviewed patient.   Final Clinical Impressions(s) / UC Diagnoses   Final diagnoses:  Pneumonia due to COVID-19 virus  Cough  Shortness of breath  Loss of appetite     Discharge Instructions     Your chest x-ray appears to be consistent with Covid pneumonia.  As discussed treatment is supportive.  I would advise you purchase over-the-counter Coricidin HBP cough syrup which will not raise your blood pressure.  Increase your fluid intake.  I am sending prednisone, which can elevate your blood pressure, but will help to open up your airway.  I am also sending an albuterol inhaler.  We discussed how to use it, but if you are still not sure, look up a video on YouTube which can help you.  Rest and increase your fluid intake.  I expect the prednisone will stimulate your appetite a little.  If for some reason you are not feeling better the next week or symptoms worsen you need to be seen again.  For acute worsening of shortness of breath or weakness go to ED.  Keep in contact with your PCP and let them know about your visit here today.  COVID-19 INFECTION: The incubation period of COVID-19 is approximately 14 days after exposure, with most symptoms developing in roughly 4-5 days. Symptoms may range in severity from mild to critically severe. Roughly 80% of those infected will have mild symptoms. People of any age may become infected with COVID-19 and have the ability to transmit the virus. The most common symptoms include: fever, fatigue, cough, body aches, headaches, sore throat, nasal congestion, shortness of breath, nausea, vomiting, diarrhea, changes in smell and/or taste.  COURSE OF ILLNESS Some patients may begin with mild disease which can progress quickly into critical symptoms. If your symptoms are worsening please call ahead to the Emergency Department and proceed there for  further treatment. Recovery time appears to be roughly 1-2 weeks for mild symptoms and 3-6 weeks for severe disease.   GO IMMEDIATELY TO ER FOR FEVER YOU ARE UNABLE TO GET DOWN WITH TYLENOL, BREATHING PROBLEMS, CHEST PAIN, FATIGUE, LETHARGY, INABILITY TO EAT OR DRINK, ETC  QUARANTINE AND ISOLATION: To help decrease the spread of COVID-19 please remain isolated if you have COVID infection or are highly suspected to have COVID infection. This means -stay home and isolate to one room in the home if you live with others. Do not share a bed or bathroom with others while ill, sanitize and wipe down all countertops and keep common areas clean and disinfected. Stay home for 5 days. If you have no symptoms or your symptoms are resolving after 5 days, you can leave your house. Continue to wear a mask around others for 5 additional days. If you have been in close contact (within 6 feet) of someone diagnosed with COVID 19, you are advised to quarantine in your home for 14 days as symptoms can develop anywhere from 2-14 days after exposure to the virus. If you develop symptoms, you  must isolate.  Most current guidelines for COVID after exposure -unvaccinated: isolate 5 days and strict mask use x 5 days. Test on day 5 is possible -vaccinated: wear mask x 10 days if symptoms do not develop -You do not necessarily need to be tested for COVID if you have + exposure and  develop symptoms. Just isolate at home x10 days from symptom onset During this global pandemic, CDC advises to practice social distancing, try to stay at least 56ft away from others at all times. Wear a face covering. Wash and sanitize your hands regularly and avoid going anywhere that is not necessary.  KEEP IN MIND THAT THE COVID TEST IS NOT 100% ACCURATE AND YOU SHOULD STILL DO EVERYTHING TO PREVENT POTENTIAL SPREAD OF VIRUS TO OTHERS (WEAR MASK, WEAR GLOVES, [place] HANDS AND SANITIZE REGULARLY). IF INITIAL TEST IS NEGATIVE, THIS MAY NOT MEAN YOU ARE  DEFINITELY NEGATIVE. MOST ACCURATE TESTING IS DONE 5-7 DAYS AFTER EXPOSURE.   It is not advised by CDC to get re-tested after receiving a positive COVID test since you can still test positive for weeks to months after you have already cleared the virus.   *If you have not been vaccinated for COVID, I strongly suggest you consider getting vaccinated as long as there are no contraindications.      ED Prescriptions    Medication Sig Dispense Auth. Provider   predniSONE (DELTASONE) 20 MG tablet Take 2 tablets (40 mg total) by mouth daily for 5 days. 10 tablet [name] B, PA-C   albuterol (VENTOLIN HFA) 108 (90 Base) MCG/ACT inhaler Inhale 1-2 puffs into the lungs every 4 (four) hours as needed for wheezing or shortness of breath. 1 g Danton Clap, PA-C     I have reviewed the PDMP during this encounter.   Danton Clap, PA-C 04/08/20 1351

## 2020-04-08 NOTE — Discharge Instructions (Addendum)
Your chest x-ray appears to be consistent with Covid pneumonia.  As discussed treatment is supportive.  I would advise you purchase over-the-counter Coricidin HBP cough syrup which will not raise your blood pressure.  Increase your fluid intake.  I am sending prednisone, which can elevate your blood pressure, but will help to open up your airway.  I am also sending an albuterol inhaler.  We discussed how to use it, but if you are still not sure, look up a video on YouTube which can help you.  Rest and increase your fluid intake.  I expect the prednisone will stimulate your appetite a little.  If for some reason you are not feeling better the next week or symptoms worsen you need to be seen again.  For acute worsening of shortness of breath or weakness go to ED.  Keep in contact with your PCP and let them know about your visit here today.  COVID-19 INFECTION: The incubation period of COVID-19 is approximately 14 days after exposure, with most symptoms developing in roughly 4-5 days. Symptoms may range in severity from mild to critically severe. Roughly 80% of those infected will have mild symptoms. People of any age may become infected with COVID-19 and have the ability to transmit the virus. The most common symptoms include: fever, fatigue, cough, body aches, headaches, sore throat, nasal congestion, shortness of breath, nausea, vomiting, diarrhea, changes in smell and/or taste.    COURSE OF ILLNESS Some patients may begin with mild disease which can progress quickly into critical symptoms. If your symptoms are worsening please call ahead to the Emergency Department and proceed there for further treatment. Recovery time appears to be roughly 1-2 weeks for mild symptoms and 3-6 weeks for severe disease.   GO IMMEDIATELY TO ER FOR FEVER YOU ARE UNABLE TO GET DOWN WITH TYLENOL, BREATHING PROBLEMS, CHEST PAIN, FATIGUE, LETHARGY, INABILITY TO EAT OR DRINK, ETC  QUARANTINE AND ISOLATION: To help decrease the  spread of COVID-19 please remain isolated if you have COVID infection or are highly suspected to have COVID infection. This means -stay home and isolate to one room in the home if you live with others. Do not share a bed or bathroom with others while ill, sanitize and wipe down all countertops and keep common areas clean and disinfected. Stay home for 5 days. If you have no symptoms or your symptoms are resolving after 5 days, you can leave your house. Continue to wear a mask around others for 5 additional days. If you have been in close contact (within 6 feet) of someone diagnosed with COVID 19, you are advised to quarantine in your home for 14 days as symptoms can develop anywhere from 2-14 days after exposure to the virus. If you develop symptoms, you  must isolate.  Most current guidelines for COVID after exposure -unvaccinated: isolate 5 days and strict mask use x 5 days. Test on day 5 is possible -vaccinated: wear mask x 10 days if symptoms do not develop -You do not necessarily need to be tested for COVID if you have + exposure and  develop symptoms. Just isolate at home x10 days from symptom onset During this global pandemic, CDC advises to practice social distancing, try to stay at least 39ft away from others at all times. Wear a face covering. Wash and sanitize your hands regularly and avoid going anywhere that is not necessary.  KEEP IN MIND THAT THE COVID TEST IS NOT 100% ACCURATE AND YOU SHOULD STILL DO EVERYTHING TO  PREVENT POTENTIAL SPREAD OF VIRUS TO OTHERS (WEAR MASK, WEAR GLOVES, [place] HANDS AND SANITIZE REGULARLY). IF INITIAL TEST IS NEGATIVE, THIS MAY NOT MEAN YOU ARE DEFINITELY NEGATIVE. MOST ACCURATE TESTING IS DONE 5-7 DAYS AFTER EXPOSURE.   It is not advised by CDC to get re-tested after receiving a positive COVID test since you can still test positive for weeks to months after you have already cleared the virus.   *If you have not been vaccinated for COVID, I strongly suggest you  consider getting vaccinated as long as there are no contraindications.

## 2020-04-09 ENCOUNTER — Other Ambulatory Visit: Payer: Self-pay | Admitting: Hospice and Palliative Medicine

## 2020-04-09 ENCOUNTER — Encounter: Payer: Self-pay | Admitting: Hospice and Palliative Medicine

## 2020-04-09 ENCOUNTER — Ambulatory Visit (INDEPENDENT_AMBULATORY_CARE_PROVIDER_SITE_OTHER): Payer: No Typology Code available for payment source | Admitting: Physician Assistant

## 2020-04-09 VITALS — BP 124/81 | Temp 97.9°F

## 2020-04-09 DIAGNOSIS — J1282 Pneumonia due to coronavirus disease 2019: Secondary | ICD-10-CM | POA: Diagnosis not present

## 2020-04-09 DIAGNOSIS — I10 Essential (primary) hypertension: Secondary | ICD-10-CM | POA: Diagnosis not present

## 2020-04-09 DIAGNOSIS — U071 COVID-19: Secondary | ICD-10-CM

## 2020-04-09 MED ORDER — AMOXICILLIN 875 MG PO TABS: 875.0000 mg | ORAL_TABLET | Freq: Two times a day (BID) | ORAL | 0 refills | Status: DC

## 2020-04-09 MED ORDER — DOXYCYCLINE HYCLATE 100 MG PO TABS: 100.0000 mg | ORAL_TABLET | Freq: Two times a day (BID) | ORAL | 0 refills | Status: DC

## 2020-04-09 NOTE — Progress Notes (Signed)
[facility] [place], [place] [address]  Internal MEDICINE  Telephone Visit  Patient Name: Timothy Moore  [id number]  [id number]  Date of Service: 04/11/2020  I connected with the patient at [phone] by telephone and verified the patients identity using two identifiers.   I discussed the limitations, risks, security and privacy concerns of performing an evaluation and management service by telephone and the availability of in person appointments. I also discussed with the patient that there may be a patient responsible charge related to the service.  The patient expressed understanding and agrees to proceed.    Chief Complaint  Patient presents with  . telephone assesment    PT contact number [phone]  . Telephone Screen  . Gastroesophageal Reflux  . Hypertension    HPI  Pt presents today for follow up of covid. His symptoms started 2 Fridays ago and he reported a covid positive test to [place] on 1/21. He did a home test today that was still positive. He reported to urgent care yesterday for SOB with walking and they told him he had PNA due to covid. He was given a steroid, inhaler, and two antibiotics to help with this. He denies fever, chills, or body aches anymore. He is fatigued and has lost some weight. He is using delsym cough syrup to help with lingering cough. Overall he is feeling better. BP was stable at urgent care yesterday and he is able to monitor it at home.    Current Medication: Outpatient Encounter Medications as of 04/09/2020  Medication Sig  . albuterol (VENTOLIN HFA) 108 (90 Base) MCG/ACT inhaler Inhale 1-2 puffs into the lungs every 4 (four) hours as needed for wheezing or shortness of breath.  Marland Kitchen amLODipine (NORVASC) 5 MG tablet Take 1 tablet (5 mg total) by mouth daily.  Marland Kitchen amoxicillin (AMOXIL) 875 MG tablet Take 1 tablet (875 mg total) by mouth 2 (two) times daily.  Marland Kitchen doxycycline (VIBRA-TABS) 100 MG tablet Take 1 tablet (100 mg total) by  mouth 2 (two) times daily.  Marland Kitchen losartan (COZAAR) 50 MG tablet Take 1 tablet (50 mg total) by mouth daily.  . Multiple Vitamin (MULTIVITAMIN) tablet Take 1 tablet by mouth daily.  Marland Kitchen omeprazole (PRILOSEC) 20 MG capsule Take 1 capsule (20 mg total) by mouth daily.  . predniSONE (DELTASONE) 20 MG tablet Take 2 tablets (40 mg total) by mouth daily for 5 days.   No facility-administered encounter medications on file as of 04/09/2020.    Surgical History: History reviewed. No pertinent surgical history.  Medical History: Past Medical History:  Diagnosis Date  . GERD (gastroesophageal reflux disease)   . Hypertension     Family History: Family History  Problem Relation Age of Onset  . Kidney Stones Mother   . Cancer Father   . Lupus Sister   . Cancer Maternal Grandmother   . Cancer Paternal Grandmother     Social History   Socioeconomic History  . Marital status: Married    Spouse name: Not on file  . Number of children: Not on file  . Years of education: Not on file  . Highest education level: Not on file  Occupational History  . Not on file  Tobacco Use  . Smoking status: Former Smoker    Types: Cigarettes  . Smokeless tobacco: Never Used  . Tobacco comment: quit 11 years ago   Substance and Sexual Activity  . Alcohol use: Yes    Comment: 2 shots a month  .  Drug use: Never    Comment: marijuana 20 years ago  . Sexual activity: Yes    Birth control/protection: Condom  Other Topics Concern  . Not on file  Social History Narrative  . Not on file   Social Determinants of Health   Financial Resource Strain: Not on file  Food Insecurity: Not on file  Transportation Needs: Not on file  Physical Activity: Not on file  Stress: Not on file  Social Connections: Not on file  Intimate Partner Violence: Not on file      Review of Systems  Constitutional: Positive for appetite change and fatigue. Negative for chills and fever.  HENT: Negative for congestion, mouth  sores and postnasal drip.   Respiratory: Positive for cough. Negative for shortness of breath and wheezing.   Cardiovascular: Negative for chest pain.  Genitourinary: Negative for flank pain.  Neurological: Negative for headaches.  Psychiatric/Behavioral: Negative.     Vital Signs: BP 124/81   Temp 97.9 F (36.6 C)    Observation/Objective:  Pt is able to carry out conversation.  Assessment/Plan: 1. Pneumonia due to COVID-19 virus Chest Xray at urgent care yesterday consistent with PNA. He was started on Prednisone and albuterol inhaler. Also given amoxicillin and doxycyline. Encouraged to stay well hydrated.  2. Essential hypertension Stable at urgent care yesterday. He will continue 5mg  amlodipine and 50mg  losartan and monitor BP at home.  General Counseling: Timothy Moore understanding of the findings of today's phone visit and agrees with plan of treatment. I have discussed any further diagnostic evaluation that may be needed or ordered today. We also reviewed his medications today. he has been encouraged to call the office with any questions or concerns that should arise related to todays visit.     No orders of the defined types were placed in this encounter.   Time spent:15 Minutes    Dr Lavera Guise Internal medicine

## 2020-04-10 ENCOUNTER — Encounter: Payer: Self-pay | Admitting: Hospice and Palliative Medicine

## 2020-04-11 ENCOUNTER — Other Ambulatory Visit: Payer: Self-pay | Admitting: Hospice and Palliative Medicine

## 2020-04-11 ENCOUNTER — Encounter: Payer: Self-pay | Admitting: Hospice and Palliative Medicine

## 2020-04-11 MED ORDER — AMOXICILLIN 875 MG PO TABS: 875.0000 mg | ORAL_TABLET | Freq: Two times a day (BID) | ORAL | 0 refills | Status: DC

## 2020-04-12 ENCOUNTER — Encounter: Payer: Self-pay | Admitting: Hospice and Palliative Medicine

## 2020-04-16 ENCOUNTER — Encounter: Payer: Self-pay | Admitting: Hospice and Palliative Medicine

## 2020-04-21 ENCOUNTER — Encounter (HOSPITAL_COMMUNITY): Payer: Self-pay | Admitting: Emergency Medicine

## 2020-04-21 ENCOUNTER — Ambulatory Visit (HOSPITAL_COMMUNITY)
Admission: EM | Admit: 2020-04-21 | Discharge: 2020-04-21 | Disposition: A | Discharge status: Home / Self Care | Payer: No Typology Code available for payment source | Attending: Student | Admitting: Student

## 2020-04-21 ENCOUNTER — Other Ambulatory Visit: Payer: Self-pay

## 2020-04-21 DIAGNOSIS — J3089 Other allergic rhinitis: Secondary | ICD-10-CM

## 2020-04-21 DIAGNOSIS — H65111 Acute and subacute allergic otitis media (mucoid) (sanguinous) (serous), right ear: Secondary | ICD-10-CM

## 2020-04-21 DIAGNOSIS — Z8616 Personal history of COVID-19: Secondary | ICD-10-CM | POA: Diagnosis not present

## 2020-04-21 MED ORDER — CETIRIZINE HCL 10 MG PO TABS: 10.0000 mg | ORAL_TABLET | Freq: Every day | ORAL | 2 refills | Status: DC

## 2020-04-21 MED ORDER — AMOXICILLIN-POT CLAVULANATE 875-125 MG PO TABS: 1.0000 | ORAL_TABLET | Freq: Two times a day (BID) | ORAL | 0 refills | Status: DC

## 2020-04-21 NOTE — Discharge Instructions (Addendum)
-  Augmentin twice daily for 7 days -Zyrtec once daily for seasonal allergies

## 2020-04-21 NOTE — ED Provider Notes (Signed)
Timothy Moore    CSN: [id number] Arrival date & time: 04/21/20  1726      History   Chief Complaint Chief Complaint  Patient presents with  . Jaw Pain    HPI Timothy Moore is a 55 y.o. male presenting for right ear pain x1 day. This patient has a history of seasonal allergic rhinitis, currently poorly controlled on no medications. States his right ear has been tender today, with radiation of pain down jaw. Denies dizziness, hearing changes, tinnitus. Denies fevers/chills, n/v/d, shortness of breath, chest pain, cough, congestion, facial pain, teeth pain, headaches, sore throat, loss of taste/smell, swollen lymph nodes. Nasal congestion and PND. Also recent history of covid with covid pneumonia, for which he was treated 2 weeks ago. States today he is breathing much better and feeling overall much better than when he had covid. Denies chest pain, shortness of breath.   HPI  Past Medical History:  Diagnosis Date  . GERD (gastroesophageal reflux disease)   . Hypertension     There are no problems to display for this patient.   History reviewed. No pertinent surgical history.     Home Medications    Prior to Admission medications   Medication Sig Start Date End Date Taking? Authorizing Provider  amLODipine (NORVASC) 5 MG tablet Take 1 tablet (5 mg total) by mouth daily. 03/19/20  Yes Luiz Ochoa, NP  amoxicillin-clavulanate (AUGMENTIN) 875-125 MG tablet Take 1 tablet by mouth every 12 (twelve) hours. 04/21/20  Yes [name], PA-C  cetirizine (ZYRTEC ALLERGY) 10 MG tablet Take 1 tablet (10 mg total) by mouth daily. 04/21/20  Yes [name], PA-C  losartan (COZAAR) 50 MG tablet Take 1 tablet (50 mg total) by mouth daily. 02/12/20  Yes Luiz Ochoa, NP  Multiple Vitamin (MULTIVITAMIN) tablet Take 1 tablet by mouth daily.   Yes [provider]  omeprazole (PRILOSEC) 20 MG capsule Take 1 capsule (20 mg total) by mouth daily. 02/12/20  Yes Luiz Ochoa, NP  albuterol (VENTOLIN HFA) 108 (90 Base) MCG/ACT inhaler Inhale 1-2 puffs into the lungs every 4 (four) hours as needed for wheezing or shortness of breath. 04/08/20 04/08/21  Danton Clap, PA-C    Family History Family History  Problem Relation Age of Onset  . Kidney Stones Mother   . Cancer Father   . Lupus Sister   . Cancer Maternal Grandmother   . Cancer Paternal Grandmother     Social History Social History   Tobacco Use  . Smoking status: Former Smoker    Types: Cigarettes  . Smokeless tobacco: Never Used  . Tobacco comment: quit 11 years ago   Substance Use Topics  . Alcohol use: Yes    Comment: 2 shots a month  . Drug use: Never    Comment: marijuana 20 years ago     Allergies   Patient has no known allergies.   Review of Systems Review of Systems  Constitutional: Negative for appetite change, chills and fever.  HENT: Positive for congestion and ear pain. Negative for ear discharge, rhinorrhea, sinus pressure, sinus pain and sore throat.   Eyes: Negative for redness and visual disturbance.  Respiratory: Negative for cough, chest tightness, shortness of breath and wheezing.   Cardiovascular: Negative for chest pain and palpitations.  Gastrointestinal: Negative for abdominal pain, constipation, diarrhea, nausea and vomiting.  Genitourinary: Negative for dysuria, frequency and urgency.  Musculoskeletal: Negative for myalgias.  Neurological: Negative for dizziness, weakness and headaches.  Psychiatric/Behavioral: Negative for confusion.  All other systems reviewed and are negative.    Physical Exam Triage Vital Signs ED Triage Vitals  Enc Vitals Group     BP 04/21/20 1842 135/89     Pulse Rate 04/21/20 1842 83     Resp 04/21/20 1842 18     Temp 04/21/20 1842 98.3 F (36.8 C)     Temp Source 04/21/20 1842 Oral     SpO2 04/21/20 1842 98 %     Weight --      Height --      Head Circumference --      Peak Flow --      Pain Score 04/21/20 1838 6      Pain Loc --      Pain Edu? --      Excl. in [place]? --    No data found.  Updated Vital Signs BP 135/89 (BP Location: Right Arm)   Pulse 83   Temp 98.3 F (36.8 C) (Oral)   Resp 18   SpO2 98%   Visual Acuity Right Eye Distance:   Left Eye Distance:   Bilateral Distance:    Right Eye Near:   Left Eye Near:    Bilateral Near:     Physical Exam Vitals reviewed.  Constitutional:      General: He is not in acute distress.    Appearance: Normal appearance. He is not ill-appearing.  HENT:     Head: Normocephalic and atraumatic.     Right Ear: Hearing, ear canal and external ear normal. Tenderness present. No swelling. There is no impacted cerumen. No mastoid tenderness. Tympanic membrane is erythematous. Tympanic membrane is not perforated, retracted or bulging.     Left Ear: Hearing, tympanic membrane, ear canal and external ear normal. No swelling or tenderness. There is no impacted cerumen. No mastoid tenderness. Tympanic membrane is not perforated, erythematous, retracted or bulging.     Ears:     Comments: R preauricular LN tenderness.     Nose:     Right Sinus: No maxillary sinus tenderness or frontal sinus tenderness.     Left Sinus: No maxillary sinus tenderness or frontal sinus tenderness.     Mouth/Throat:     Mouth: Mucous membranes are moist.     Pharynx: Uvula midline. No oropharyngeal exudate or posterior oropharyngeal erythema.     Tonsils: No tonsillar exudate.     Comments: No TMJ tenderness No jaw pain with movement  Cardiovascular:     Rate and Rhythm: Normal rate and regular rhythm.     Heart sounds: Normal heart sounds.  Pulmonary:     Breath sounds: Normal breath sounds and air entry. No wheezing, rhonchi or rales.  Chest:     Chest wall: No tenderness.  Abdominal:     General: Abdomen is flat. Bowel sounds are normal.     Tenderness: There is no abdominal tenderness. There is no guarding or rebound.  Lymphadenopathy:     Cervical: No cervical  adenopathy.  Neurological:     General: No focal deficit present.     Mental Status: He is alert and oriented to person, place, and time.  Psychiatric:        Attention and Perception: Attention and perception normal.        Mood and Affect: Mood and affect normal.        Behavior: Behavior normal. Behavior is cooperative.        Thought Content: Thought content normal.  Judgment: Judgment normal.      UC Treatments / Results  Labs (all labs ordered are listed, but only abnormal results are displayed) Labs Reviewed - No data to display  EKG   Radiology No results found.  Procedures Procedures (including critical care time)  Medications Ordered in UC Medications - No data to display  Initial Impression / Assessment and Plan / UC Course  I have reviewed the triage vital signs and the nursing notes.  Pertinent labs & imaging results that were available during my care of the patient were reviewed by me and considered in my medical decision making (see chart for details).     For AOM, augmentin as below. We discussed that jaw pain is most likely related to preauricular lymphadenopathy.   Zyrtec for seasonal allergies.   Patient was diagnosed with covid pneumonia 2-3 weeks ago and treated with abx. Today denies shortness of breath, chest pain.  afebrile nontachycardic nontachypneic, oxygenating well on room air. No wheezes rhonchi or rales.  We discussed that he can get vaccinated for covid in the next 2-3 months.   Spent over 40 minutes obtaining H&P, performing physical, discussing results, treatment plan and plan for follow-up with patient. Patient agrees with plan.    Final Clinical Impressions(s) / UC Diagnoses   Final diagnoses:  Non-recurrent acute allergic otitis media of right ear  Seasonal allergic rhinitis due to other allergic trigger  History of COVID-19     Discharge Instructions     -Augmentin twice daily for 7 days -Zyrtec once daily for  seasonal allergies   ED Prescriptions    Medication Sig Dispense Auth. Provider   amoxicillin-clavulanate (AUGMENTIN) 875-125 MG tablet Take 1 tablet by mouth every 12 (twelve) hours. 14 tablet [name], PA-C   cetirizine (ZYRTEC ALLERGY) 10 MG tablet Take 1 tablet (10 mg total) by mouth daily. 30 tablet [name], PA-C     PDMP not reviewed this encounter.   [name], PA-C 04/21/20 1946

## 2020-04-21 NOTE — ED Triage Notes (Signed)
Onset today of pain in right ear, right side of neck.  Patient has had sinus congestion.

## 2020-04-30 ENCOUNTER — Ambulatory Visit (INDEPENDENT_AMBULATORY_CARE_PROVIDER_SITE_OTHER): Payer: No Typology Code available for payment source | Admitting: Hospice and Palliative Medicine

## 2020-04-30 ENCOUNTER — Other Ambulatory Visit: Payer: Self-pay

## 2020-04-30 ENCOUNTER — Encounter: Payer: Self-pay | Admitting: Hospice and Palliative Medicine

## 2020-04-30 VITALS — BP 133/88 | HR 88 | Temp 97.6°F | Resp 16 | Ht 65.0 in | Wt 168.0 lb

## 2020-04-30 DIAGNOSIS — N529 Male erectile dysfunction, unspecified: Secondary | ICD-10-CM

## 2020-04-30 DIAGNOSIS — I10 Essential (primary) hypertension: Secondary | ICD-10-CM

## 2020-04-30 DIAGNOSIS — Z1211 Encounter for screening for malignant neoplasm of colon: Secondary | ICD-10-CM

## 2020-04-30 MED ORDER — SILDENAFIL CITRATE 100 MG PO TABS: 50.0000 mg | ORAL_TABLET | Freq: Every day | ORAL | 3 refills | Status: DC | PRN

## 2020-04-30 NOTE — Progress Notes (Signed)
[facility] [place], [place] [address]  Internal MEDICINE  Office Visit Note  Patient Name: Timothy Moore  [id number]  [id number]  Date of Service: 04/30/2020  Chief Complaint  Patient presents with  . 6 week follow up    Review labs  . Gastroesophageal Reflux  . Hypertension  . Quality Metric Gaps    Colonoscopy     HPI Patient is here for routine follow-up Has recovered from COVID--symptoms lingered for several weeks but he is now feeling better and has returned to work BP-readings have been stable at home C/o issues with erectile dysfunction, since starting his blood medications he has been having issues He has also been working on changing his diet--in the past his diet consisted of mostly fast food and multiple bags of potato chips per day He has cut out the snacking and eating less fast food Reviewed recent labs--slightly elevated glucose, abnormal lipid panel  Due for colonoscopy--he is not interested, discussed Cologuard   Current Medication: Outpatient Encounter Medications as of 04/30/2020  Medication Sig  . sildenafil (VIAGRA) 100 MG tablet Take 0.5-1 tablets (50-100 mg total) by mouth daily as needed for erectile dysfunction.  Marland Kitchen albuterol (VENTOLIN HFA) 108 (90 Base) MCG/ACT inhaler Inhale 1-2 puffs into the lungs every 4 (four) hours as needed for wheezing or shortness of breath.  Marland Kitchen amLODipine (NORVASC) 5 MG tablet Take 1 tablet (5 mg total) by mouth daily.  . cetirizine (ZYRTEC ALLERGY) 10 MG tablet Take 1 tablet (10 mg total) by mouth daily.  Marland Kitchen losartan (COZAAR) 50 MG tablet Take 1 tablet (50 mg total) by mouth daily.  . Multiple Vitamin (MULTIVITAMIN) tablet Take 1 tablet by mouth daily.  Marland Kitchen omeprazole (PRILOSEC) 20 MG capsule Take 1 capsule (20 mg total) by mouth daily.  . [DISCONTINUED] amoxicillin-clavulanate (AUGMENTIN) 875-125 MG tablet Take 1 tablet by mouth every 12 (twelve) hours.   No facility-administered encounter  medications on file as of 04/30/2020.    Surgical History: History reviewed. No pertinent surgical history.  Medical History: Past Medical History:  Diagnosis Date  . GERD (gastroesophageal reflux disease)   . Hypertension     Family History: Family History  Problem Relation Age of Onset  . Kidney Stones Mother   . Cancer Father   . Lupus Sister   . Cancer Maternal Grandmother   . Cancer Paternal Grandmother     Social History   Socioeconomic History  . Marital status: Married    Spouse name: Not on file  . Number of children: Not on file  . Years of education: Not on file  . Highest education level: Not on file  Occupational History  . Not on file  Tobacco Use  . Smoking status: Former Smoker    Types: Cigarettes  . Smokeless tobacco: Never Used  . Tobacco comment: quit 11 years ago   Substance and Sexual Activity  . Alcohol use: Yes    Comment: 2 shots a month  . Drug use: Never    Comment: marijuana 20 years ago  . Sexual activity: Yes    Birth control/protection: Condom  Other Topics Concern  . Not on file  Social History Narrative  . Not on file   Social Determinants of Health   Financial Resource Strain: Not on file  Food Insecurity: Not on file  Transportation Needs: Not on file  Physical Activity: Not on file  Stress: Not on file  Social Connections: Not on file  Intimate Partner Violence:  Not on file   Review of Systems  Constitutional: Negative for chills, fatigue and unexpected weight change.  HENT: Negative for congestion, postnasal drip, rhinorrhea, sneezing and sore throat.   Eyes: Negative for redness.  Respiratory: Negative for cough, chest tightness and shortness of breath.   Cardiovascular: Negative for chest pain and palpitations.  Gastrointestinal: Negative for abdominal pain, constipation, diarrhea, nausea and vomiting.  Genitourinary: Negative for dysuria and frequency.       Erectile dysfunction  Musculoskeletal: Negative for  arthralgias, back pain, joint swelling and neck pain.  Skin: Negative for rash.  Neurological: Negative for tremors and numbness.  Hematological: Negative for adenopathy. Does not bruise/bleed easily.  Psychiatric/Behavioral: Negative for behavioral problems (Depression), sleep disturbance and suicidal ideas. The patient is not nervous/anxious.     Vital Signs: BP 133/88   Pulse 88   Temp 97.6 F (36.4 C)   Resp 16   Ht 5\' 5"  (1.651 m)   Wt 168 lb (76.2 kg)   SpO2 95%   BMI 27.96 kg/m    Physical Exam Vitals reviewed.  Constitutional:      Appearance: Normal appearance. He is normal weight.  Cardiovascular:     Rate and Rhythm: Normal rate and regular rhythm.     Pulses: Normal pulses.     Heart sounds: Normal heart sounds.  Pulmonary:     Effort: Pulmonary effort is normal.     Breath sounds: Normal breath sounds.  Abdominal:     General: Abdomen is flat.     Palpations: Abdomen is soft.  Musculoskeletal:        General: Normal range of motion.     Cervical back: Normal range of motion.  Skin:    General: Skin is warm.  Neurological:     General: No focal deficit present.     Mental Status: He is alert and oriented to person, place, and time. Mental status is at baseline.  Psychiatric:        Mood and Affect: Mood normal.        Behavior: Behavior normal.        Thought Content: Thought content normal.        Judgment: Judgment normal.    Assessment/Plan: 1. Essential hypertension BP and HR well controlled, continue to monitor  2. Erectile dysfunction, unspecified erectile dysfunction type Trial sildenafil, if symptoms persist consider referral to urology Discussed how to appropriately take sildenafil, advised to start with 1/2 tablet, seek emergency department care for prolonged erection - sildenafil (VIAGRA) 100 MG tablet; Take 0.5-1 tablets (50-100 mg total) by mouth daily as needed for erectile dysfunction.  Dispense: 5 tablet; Refill: 3  3. Screening  for colon cancer Set up Cologuard  General Counseling: alessio bogan understanding of the findings of todays visit and agrees with plan of treatment. I have discussed any further diagnostic evaluation that may be needed or ordered today. We also reviewed his medications today. he has been encouraged to call the office with any questions or concerns that should arise related to todays visit.   Meds ordered this encounter  Medications  . sildenafil (VIAGRA) 100 MG tablet    Sig: Take 0.5-1 tablets (50-100 mg total) by mouth daily as needed for erectile dysfunction.    Dispense:  5 tablet    Refill:  3    Time spent: 30 Minutes Time spent includes review of chart, medications, test results and follow-up plan with the patient.  This patient was seen by Theodoro Grist  AGNP-C in Collaboration with Dr Lavera Guise as a part of collaborative care agreement     [name]. Khaza Blansett AGNP-C Internal medicine

## 2020-05-03 ENCOUNTER — Encounter: Payer: Self-pay | Admitting: Hospice and Palliative Medicine

## 2020-05-05 ENCOUNTER — Encounter: Payer: Self-pay | Admitting: Hospice and Palliative Medicine

## 2020-05-05 ENCOUNTER — Other Ambulatory Visit: Payer: Self-pay | Admitting: Internal Medicine

## 2020-05-05 DIAGNOSIS — I10 Essential (primary) hypertension: Secondary | ICD-10-CM

## 2020-05-05 MED ORDER — LOSARTAN POTASSIUM 50 MG PO TABS: 50.0000 mg | ORAL_TABLET | Freq: Every day | ORAL | 3 refills | Status: DC

## 2020-05-09 ENCOUNTER — Encounter: Payer: Self-pay | Admitting: Hospice and Palliative Medicine

## 2020-05-09 ENCOUNTER — Other Ambulatory Visit: Payer: Self-pay | Admitting: Hospice and Palliative Medicine

## 2020-05-09 DIAGNOSIS — I10 Essential (primary) hypertension: Secondary | ICD-10-CM

## 2020-05-09 MED ORDER — LOSARTAN POTASSIUM 50 MG PO TABS: 50.0000 mg | ORAL_TABLET | Freq: Every day | ORAL | 3 refills | Status: DC

## 2020-05-12 ENCOUNTER — Other Ambulatory Visit: Payer: Self-pay

## 2020-05-12 ENCOUNTER — Encounter: Payer: Self-pay | Admitting: Gastroenterology

## 2020-05-12 ENCOUNTER — Encounter: Payer: Self-pay | Admitting: Hospice and Palliative Medicine

## 2020-05-12 ENCOUNTER — Ambulatory Visit (INDEPENDENT_AMBULATORY_CARE_PROVIDER_SITE_OTHER): Payer: No Typology Code available for payment source | Admitting: Gastroenterology

## 2020-05-12 VITALS — BP 135/88 | HR 91 | Temp 98.3°F | Ht 65.0 in | Wt 166.0 lb

## 2020-05-12 DIAGNOSIS — Z1211 Encounter for screening for malignant neoplasm of colon: Secondary | ICD-10-CM

## 2020-05-12 DIAGNOSIS — K219 Gastro-esophageal reflux disease without esophagitis: Secondary | ICD-10-CM | POA: Diagnosis not present

## 2020-05-12 MED ORDER — NA SULFATE-K SULFATE-MG SULF 17.5-3.13-1.6 GM/177ML PO SOLN: ORAL | 0 refills | Status: DC

## 2020-05-12 NOTE — Patient Instructions (Signed)
Please buy a pillow wedge so it could help you with your acid reflux.

## 2020-05-13 NOTE — Progress Notes (Signed)
Timothy Moore [address]  [place]  [place], [place] [address]  Main: [phone]  Fax: [phone]   Gastroenterology Consultation  Referring Provider:     Luiz Ochoa, NP Primary Care Physician:  Luiz Ochoa, NP Reason for Consultation:    GERD, weight loss        HPI:    Chief Complaint  Patient presents with  . Weight Loss    Patient stated that he had lost some weight and he also has GERD.    Timothy Moore is a 55 y.o. y/o male referred for consultation & management  by Dr. [name], [name], NP.  Patient reports having chronic history of daily heartburn, with burning sensation in chest, indigestion, and was taking over-the-counter Tums or Rolaids or other antacids daily for years.  Recently, his PCP started him on Prilosec which has helped his symptoms.  No prior EGD or colonoscopy.  No blood in stool.  No nausea or vomiting.  No altered bowel habits.  Past Medical History:  Diagnosis Date  . GERD (gastroesophageal reflux disease)   . Hypertension     History reviewed. No pertinent surgical history.  Prior to Admission medications   Medication Sig Start Date End Date Taking? Authorizing Provider  albuterol (VENTOLIN HFA) 108 (90 Base) MCG/ACT inhaler Inhale 1-2 puffs into the lungs every 4 (four) hours as needed for wheezing or shortness of breath. 04/08/20 04/08/21 Yes [name] B, PA-C  amLODipine (NORVASC) 5 MG tablet Take 1 tablet (5 mg total) by mouth daily. 03/19/20  Yes Luiz Ochoa, NP  cetirizine (ZYRTEC ALLERGY) 10 MG tablet Take 1 tablet (10 mg total) by mouth daily. 04/21/20  Yes [name], PA-C  losartan (COZAAR) 50 MG tablet Take 1 tablet (50 mg total) by mouth daily. 05/09/20  Yes Luiz Ochoa, NP  Multiple Vitamin (MULTIVITAMIN) tablet Take 1 tablet by mouth daily.   Yes [provider]  omeprazole (PRILOSEC) 20 MG capsule Take 1 capsule (20 mg total) by mouth daily. 02/12/20  Yes Luiz Ochoa, NP   sildenafil (VIAGRA) 100 MG tablet Take 0.5-1 tablets (50-100 mg total) by mouth daily as needed for erectile dysfunction. 04/30/20  Yes Luiz Ochoa, NP  Na Sulfate-K Sulfate-Mg Sulf 17.5-3.13-1.6 GM/177ML SOLN At 5 PM the day before procedure take 1 bottle and 5 hours before procedure take 1 bottle. 05/12/20   [name], MD    Family History  Problem Relation Age of Onset  . Kidney Stones Mother   . Cancer Father   . Lupus Sister   . Cancer Maternal Grandmother   . Cancer Paternal Grandmother      Social History   Tobacco Use  . Smoking status: Former Smoker    Types: Cigarettes  . Smokeless tobacco: Never Used  . Tobacco comment: quit 11 years ago   Substance Use Topics  . Alcohol use: Yes    Comment: 2 shots a month  . Drug use: Never    Comment: marijuana 20 years ago    Allergies as of 05/12/2020  . (No Known Allergies)    Review of Systems:    All systems reviewed and negative except where noted in HPI.   Physical Exam:  BP 135/88   Pulse 91   Temp 98.3 F (36.8 C) (Oral)   Ht 5\' 5"  (1.651 m)   Wt 166 lb (75.3 kg)   BMI 27.62 kg/m  No LMP for male patient. Psych:  Alert and cooperative. Normal mood and affect. General:   Alert,  Well-developed, well-nourished, pleasant and cooperative in NAD Head:  Normocephalic and atraumatic. Eyes:  Sclera clear, no icterus.   Conjunctiva pink. Ears:  Normal auditory acuity. Nose:  No deformity, discharge, or lesions. Mouth:  No deformity or lesions,oropharynx pink & moist. Neck:  Supple; no masses or thyromegaly. Abdomen:  Normal bowel sounds.  No bruits.  Soft, non-tender and non-distended without masses, hepatosplenomegaly or hernias noted.  No guarding or rebound tenderness.    Msk:  Symmetrical without gross deformities. Good, equal movement & strength bilaterally. Pulses:  Normal pulses noted. Extremities:  No clubbing or edema.  No cyanosis. Neurologic:  Alert and oriented x3;  grossly normal  neurologically. Skin:  Intact without significant lesions or rashes. No jaundice. Lymph Nodes:  No significant cervical adenopathy. Psych:  Alert and cooperative. Normal mood and affect.   Labs: CBC    Component Value Date/Time   WBC 6.0 03/24/2020 0802   RBC 5.03 03/24/2020 0802   HGB 14.7 03/24/2020 0802   HCT 42.1 03/24/2020 0802   PLT 284 03/24/2020 0802   MCV 84 03/24/2020 0802   MCH 29.2 03/24/2020 0802   MCHC 34.9 03/24/2020 0802   RDW 13.2 03/24/2020 0802   LYMPHSABS 1.5 03/24/2020 0802   EOSABS 0.1 03/24/2020 0802   BASOSABS 0.0 03/24/2020 0802   CMP     Component Value Date/Time   NA 137 03/24/2020 0802   K 4.4 03/24/2020 0802   CL 101 03/24/2020 0802   CO2 21 03/24/2020 0802   GLUCOSE 109 (H) 03/24/2020 0802   BUN 11 03/24/2020 0802   CREATININE 1.11 03/24/2020 0802   CALCIUM 9.7 03/24/2020 0802   PROT 7.5 03/24/2020 0802   ALBUMIN 4.7 03/24/2020 0802   AST 38 03/24/2020 0802   ALT 24 03/24/2020 0802   ALKPHOS 110 03/24/2020 0802   BILITOT 0.6 03/24/2020 0802   GFRNONAA 76 03/24/2020 0802   GFRAA 88 03/24/2020 0802    Imaging Studies: No results found.  Assessment and Plan:   Timothy Moore is a 55 y.o. y/o male has been referred for GERD  Patient has had chronic heartburn requiring daily over-the-counter medications and was recently started on PPI and is hesitant to start coming off of it even though he has been taking it for 2 to 3 months as he reports severe symptoms without it were worse prior to it  Given the patient is over 20 years of age and has had chronic symptoms, screening for Barrett's indicated at this time  Patient also reports having abdominal pain prior to being started on PPI which is improved  Patient is also due for screening colonoscopy  I have discussed alternative options, risks & benefits,  which include, but are not limited to, bleeding, infection, perforation,respiratory complication & drug reaction.  The patient agrees  with this plan & written consent will be obtained.    Alternative options of conservative management were discussed in detail, including but not limited to medication management, foregoing endoscopic procedures at this time and others.    (Risks of PPI use were discussed with patient including bone loss, C. Diff diarrhea, pneumonia, infections, CKD, electrolyte abnormalities.  Pt. Verbalizes understanding and chooses to continue the medication.)  Patient educated extensively on acid reflux lifestyle modification, including buying a bed wedge, not eating 3 hrs before bedtime, diet modifications, and handout given for the same.    Dr Timothy Moore  Speech recognition software was  used to dictate the above note.

## 2020-05-25 ENCOUNTER — Encounter: Payer: Self-pay | Admitting: Hospice and Palliative Medicine

## 2020-05-25 ENCOUNTER — Other Ambulatory Visit: Payer: Self-pay | Admitting: Hospice and Palliative Medicine

## 2020-05-25 DIAGNOSIS — N529 Male erectile dysfunction, unspecified: Secondary | ICD-10-CM

## 2020-05-27 ENCOUNTER — Other Ambulatory Visit: Payer: Self-pay

## 2020-05-27 ENCOUNTER — Other Ambulatory Visit
Admission: RE | Admit: 2020-05-27 | Discharge: 2020-05-27 | Disposition: A | Discharge status: Home / Self Care | Payer: No Typology Code available for payment source | Source: Ambulatory Visit | Attending: Gastroenterology | Admitting: Gastroenterology

## 2020-05-27 DIAGNOSIS — Z20822 Contact with and (suspected) exposure to covid-19: Secondary | ICD-10-CM | POA: Insufficient documentation

## 2020-05-27 DIAGNOSIS — Z01812 Encounter for preprocedural laboratory examination: Secondary | ICD-10-CM | POA: Diagnosis present

## 2020-05-27 LAB — SARS CORONAVIRUS 2 (TAT 6-24 HRS): SARS Coronavirus 2: NEGATIVE

## 2020-05-28 ENCOUNTER — Encounter: Payer: Self-pay | Admitting: Hospice and Palliative Medicine

## 2020-05-28 ENCOUNTER — Ambulatory Visit: Payer: No Typology Code available for payment source | Admitting: Hospice and Palliative Medicine

## 2020-05-28 VITALS — BP 136/86 | HR 78 | Temp 98.5°F | Resp 16 | Ht 65.0 in | Wt 171.0 lb

## 2020-05-28 DIAGNOSIS — K219 Gastro-esophageal reflux disease without esophagitis: Secondary | ICD-10-CM

## 2020-05-28 DIAGNOSIS — N529 Male erectile dysfunction, unspecified: Secondary | ICD-10-CM

## 2020-05-28 DIAGNOSIS — I10 Essential (primary) hypertension: Secondary | ICD-10-CM

## 2020-05-28 NOTE — Progress Notes (Signed)
[facility] [place], [place] [address]  Internal MEDICINE  Office Visit Note  Patient Name: Timothy Moore  [id number]  [id number]  Date of Service: 05/28/2020  Chief Complaint  Patient presents with  . Hypertension    HPI Patient is here for routine follow-up Scheduled for colonoscopy tomorrow, undergoing prep today for procedure BP remains well controlled, has stopped Losartan and is currently only taking Amlodipine--home readings averaging 120-130/80 Has improved his diet and has reduced salt intake significantly  ED has also improved since discontinuing losartan and utilizing sildenafil  Wanting to discuss BV--his partner called him earlier today and explained she has tested positive for BV, he is requesting testing and treatment Explained to him there is no testing for males for BV He denies penile pain, discharge or lesions No indication for treatment at this time   Current Medication: Outpatient Encounter Medications as of 05/28/2020  Medication Sig  . albuterol (VENTOLIN HFA) 108 (90 Base) MCG/ACT inhaler Inhale 1-2 puffs into the lungs every 4 (four) hours as needed for wheezing or shortness of breath.  Marland Kitchen amLODipine (NORVASC) 5 MG tablet Take 1 tablet (5 mg total) by mouth daily.  . cetirizine (ZYRTEC ALLERGY) 10 MG tablet Take 1 tablet (10 mg total) by mouth daily.  . Multiple Vitamin (MULTIVITAMIN) tablet Take 1 tablet by mouth daily.  . Na Sulfate-K Sulfate-Mg Sulf 17.5-3.13-1.6 GM/177ML SOLN At 5 PM the day before procedure take 1 bottle and 5 hours before procedure take 1 bottle.  Marland Kitchen omeprazole (PRILOSEC) 20 MG capsule Take 1 capsule (20 mg total) by mouth daily.  . sildenafil (VIAGRA) 100 MG tablet Take 0.5-1 tablets (50-100 mg total) by mouth daily as needed for erectile dysfunction.  . [DISCONTINUED] losartan (COZAAR) 50 MG tablet Take 1 tablet (50 mg total) by mouth daily.   No facility-administered encounter medications on file as of  05/28/2020.    Surgical History: History reviewed. No pertinent surgical history.  Medical History: Past Medical History:  Diagnosis Date  . GERD (gastroesophageal reflux disease)   . Hypertension     Family History: Family History  Problem Relation Age of Onset  . Kidney Stones Mother   . Cancer Father   . Lupus Sister   . Cancer Maternal Grandmother   . Cancer Paternal Grandmother     Social History   Socioeconomic History  . Marital status: Married    Spouse name: Not on file  . Number of children: Not on file  . Years of education: Not on file  . Highest education level: Not on file  Occupational History  . Not on file  Tobacco Use  . Smoking status: Former Smoker    Types: Cigarettes  . Smokeless tobacco: Never Used  . Tobacco comment: quit 11 years ago   Substance and Sexual Activity  . Alcohol use: Yes    Comment: 2 shots a month  . Drug use: Never    Comment: marijuana 20 years ago  . Sexual activity: Yes    Birth control/protection: Condom  Other Topics Concern  . Not on file  Social History Narrative  . Not on file   Social Determinants of Health   Financial Resource Strain: Not on file  Food Insecurity: Not on file  Transportation Needs: Not on file  Physical Activity: Not on file  Stress: Not on file  Social Connections: Not on file  Intimate Partner Violence: Not on file      Review of Systems  Constitutional:  Negative for chills, fatigue and unexpected weight change.  HENT: Negative for congestion, postnasal drip, rhinorrhea, sneezing and sore throat.   Eyes: Negative for redness.  Respiratory: Negative for cough, chest tightness and shortness of breath.   Cardiovascular: Negative for chest pain and palpitations.  Gastrointestinal: Negative for abdominal pain, constipation, diarrhea, nausea and vomiting.  Genitourinary: Negative for dysuria and frequency.  Musculoskeletal: Negative for arthralgias, back pain, joint swelling and neck  pain.  Skin: Negative for rash.  Neurological: Negative for tremors and numbness.  Hematological: Negative for adenopathy. Does not bruise/bleed easily.  Psychiatric/Behavioral: Negative for behavioral problems (Depression), sleep disturbance and suicidal ideas. The patient is not nervous/anxious.     Vital Signs: BP 136/86   Pulse 78   Temp 98.5 F (36.9 C)   Resp 16   Ht 5\' 5"  (1.651 m)   Wt 171 lb (77.6 kg)   SpO2 97%   BMI 28.46 kg/m    Physical Exam Vitals reviewed.  Constitutional:      Appearance: Normal appearance. He is normal weight.  Cardiovascular:     Rate and Rhythm: Normal rate and regular rhythm.     Pulses: Normal pulses.     Heart sounds: Normal heart sounds.  Pulmonary:     Effort: Pulmonary effort is normal.     Breath sounds: Normal breath sounds.  Abdominal:     General: Abdomen is flat.     Palpations: Abdomen is soft.  Musculoskeletal:        General: Normal range of motion.     Cervical back: Normal range of motion.  Skin:    General: Skin is warm.  Neurological:     General: No focal deficit present.     Mental Status: He is alert and oriented to person, place, and time. Mental status is at baseline.  Psychiatric:        Mood and Affect: Mood normal.        Behavior: Behavior normal.        Thought Content: Thought content normal.        Judgment: Judgment normal.    Assessment/Plan: 1. Essential hypertension BP and HR well controlled Hypertension Counseling:   The following hypertensive lifestyle modification were recommended and discussed:  1. Limiting alcohol intake to less than 1 oz/day of ethanol:(24 oz of beer or 8 oz of wine or 2 oz of 100-proof whiskey). 2. Take baby ASA 81 mg daily. 3. Importance of regular aerobic exercise and losing weight. 4. Reduce dietary saturated fat and cholesterol intake for overall cardiovascular health. 5. Maintaining adequate dietary potassium, calcium, and magnesium intake. 6. Regular  monitoring of the blood pressure. 7. Reduce sodium intake to less than 100 mmol/day (less than 2.3 gm of sodium or less than 6 gm of sodium choride)   2. Erectile dysfunction, unspecified erectile dysfunction type Stable, symptom improvement with sildenafil  3. Gastroesophageal reflux disease without esophagitis Symptoms stable and well controlled on PPI  General Counseling: latasha puskas understanding of the findings of todays visit and agrees with plan of treatment. I have discussed any further diagnostic evaluation that may be needed or ordered today. We also reviewed his medications today. he has been encouraged to call the office with any questions or concerns that should arise related to todays visit.  Time spent: 30 Minutes   This patient was seen by Theodoro Grist AGNP-C in Collaboration with Dr Lavera Guise as a part of collaborative care agreement     [facility]  Doreene Adas AGNP-C Internal medicine

## 2020-05-29 ENCOUNTER — Encounter
Admission: RE | Disposition: A | Discharge status: Home / Self Care | Payer: Self-pay | Source: Home / Self Care | Attending: Gastroenterology

## 2020-05-29 ENCOUNTER — Ambulatory Visit
Admission: RE | Admit: 2020-05-29 | Discharge: 2020-05-29 | Disposition: A | Discharge status: Home / Self Care | Payer: No Typology Code available for payment source | Attending: Gastroenterology | Admitting: Gastroenterology

## 2020-05-29 ENCOUNTER — Ambulatory Visit: Payer: No Typology Code available for payment source | Admitting: Certified Registered"

## 2020-05-29 ENCOUNTER — Encounter: Payer: Self-pay | Admitting: Gastroenterology

## 2020-05-29 DIAGNOSIS — K222 Esophageal obstruction: Secondary | ICD-10-CM

## 2020-05-29 DIAGNOSIS — Z832 Family history of diseases of the blood and blood-forming organs and certain disorders involving the immune mechanism: Secondary | ICD-10-CM | POA: Diagnosis not present

## 2020-05-29 DIAGNOSIS — K2950 Unspecified chronic gastritis without bleeding: Secondary | ICD-10-CM | POA: Diagnosis not present

## 2020-05-29 DIAGNOSIS — K3189 Other diseases of stomach and duodenum: Secondary | ICD-10-CM | POA: Diagnosis not present

## 2020-05-29 DIAGNOSIS — K449 Diaphragmatic hernia without obstruction or gangrene: Secondary | ICD-10-CM | POA: Diagnosis not present

## 2020-05-29 DIAGNOSIS — Z87891 Personal history of nicotine dependence: Secondary | ICD-10-CM | POA: Insufficient documentation

## 2020-05-29 DIAGNOSIS — Z79899 Other long term (current) drug therapy: Secondary | ICD-10-CM | POA: Insufficient documentation

## 2020-05-29 DIAGNOSIS — K31A15 Gastric intestinal metaplasia without dysplasia, involving multiple sites: Secondary | ICD-10-CM | POA: Diagnosis not present

## 2020-05-29 DIAGNOSIS — D125 Benign neoplasm of sigmoid colon: Secondary | ICD-10-CM | POA: Diagnosis not present

## 2020-05-29 DIAGNOSIS — Z1211 Encounter for screening for malignant neoplasm of colon: Secondary | ICD-10-CM | POA: Diagnosis not present

## 2020-05-29 DIAGNOSIS — Z13810 Encounter for screening for upper gastrointestinal disorder: Secondary | ICD-10-CM | POA: Insufficient documentation

## 2020-05-29 DIAGNOSIS — Z809 Family history of malignant neoplasm, unspecified: Secondary | ICD-10-CM | POA: Insufficient documentation

## 2020-05-29 DIAGNOSIS — K635 Polyp of colon: Secondary | ICD-10-CM | POA: Diagnosis not present

## 2020-05-29 DIAGNOSIS — K2980 Duodenitis without bleeding: Secondary | ICD-10-CM | POA: Diagnosis not present

## 2020-05-29 DIAGNOSIS — K648 Other hemorrhoids: Secondary | ICD-10-CM | POA: Diagnosis not present

## 2020-05-29 DIAGNOSIS — K319 Disease of stomach and duodenum, unspecified: Secondary | ICD-10-CM

## 2020-05-29 DIAGNOSIS — K219 Gastro-esophageal reflux disease without esophagitis: Secondary | ICD-10-CM

## 2020-05-29 DIAGNOSIS — B9681 Helicobacter pylori [H. pylori] as the cause of diseases classified elsewhere: Secondary | ICD-10-CM | POA: Diagnosis not present

## 2020-05-29 DIAGNOSIS — Z841 Family history of disorders of kidney and ureter: Secondary | ICD-10-CM | POA: Diagnosis not present

## 2020-05-29 HISTORY — PX: ESOPHAGOGASTRODUODENOSCOPY (EGD) WITH PROPOFOL: SHX5813

## 2020-05-29 HISTORY — PX: COLONOSCOPY WITH PROPOFOL: SHX5780

## 2020-05-29 SURGERY — ESOPHAGOGASTRODUODENOSCOPY (EGD) WITH PROPOFOL
Anesthesia: General

## 2020-05-29 MED ORDER — PROPOFOL 500 MG/50ML IV EMUL
INTRAVENOUS | Status: AC
  Filled 2020-05-29: qty 50

## 2020-05-29 MED ORDER — MIDAZOLAM HCL 5 MG/5ML IJ SOLN
INTRAMUSCULAR | Status: DC | PRN
  Administered 2020-05-29: 2 mg via INTRAVENOUS

## 2020-05-29 MED ORDER — GLYCOPYRROLATE 0.2 MG/ML IJ SOLN
INTRAMUSCULAR | Status: DC | PRN
  Administered 2020-05-29: .2 mg via INTRAVENOUS

## 2020-05-29 MED ORDER — PROPOFOL 500 MG/50ML IV EMUL
INTRAVENOUS | Status: DC | PRN
  Administered 2020-05-29: 120 ug/kg/min via INTRAVENOUS

## 2020-05-29 MED ORDER — LIDOCAINE 2% (20 MG/ML) 5 ML SYRINGE
INTRAMUSCULAR | Status: DC | PRN
  Administered 2020-05-29: 25 mg via INTRAVENOUS

## 2020-05-29 MED ORDER — MIDAZOLAM HCL 2 MG/2ML IJ SOLN
INTRAMUSCULAR | Status: AC
  Filled 2020-05-29: qty 2

## 2020-05-29 MED ORDER — SODIUM CHLORIDE 0.9 % IV SOLN: INTRAVENOUS | Status: DC

## 2020-05-29 MED ORDER — PROPOFOL 10 MG/ML IV BOLUS
INTRAVENOUS | Status: DC | PRN
  Administered 2020-05-29: 70 mg via INTRAVENOUS
  Administered 2020-05-29: 30 mg via INTRAVENOUS

## 2020-05-29 NOTE — H&P (Signed)
[name], MD [address] [place], [place], [place], [address] [address], [place], [place], [place], [address] Phone: [phone]  Fax: [phone]  Primary Care Physician:  Luiz Ochoa, NP   Pre-Procedure History & Physical: HPI:  Timothy Moore is a 55 y.o. male is here for a colonoscopy and EGD.   Past Medical History:  Diagnosis Date  . GERD (gastroesophageal reflux disease)   . Hypertension     Past Surgical History:  Procedure Laterality Date  . HAND SURGERY Left     Prior to Admission medications   Medication Sig Start Date End Date Taking? Authorizing Provider  amLODipine (NORVASC) 5 MG tablet Take 1 tablet (5 mg total) by mouth daily. 03/19/20  Yes Luiz Ochoa, NP  Multiple Vitamin (MULTIVITAMIN) tablet Take 1 tablet by mouth daily.   Yes [provider]  Na Sulfate-K Sulfate-Mg Sulf 17.5-3.13-1.6 GM/177ML SOLN At 5 PM the day before procedure take 1 bottle and 5 hours before procedure take 1 bottle. 05/12/20  Yes [name], MD  omeprazole (PRILOSEC) 20 MG capsule Take 1 capsule (20 mg total) by mouth daily. 02/12/20  Yes Luiz Ochoa, NP  albuterol (VENTOLIN HFA) 108 (90 Base) MCG/ACT inhaler Inhale 1-2 puffs into the lungs every 4 (four) hours as needed for wheezing or shortness of breath. 04/08/20 04/08/21  Danton Clap, PA-C  cetirizine (ZYRTEC ALLERGY) 10 MG tablet Take 1 tablet (10 mg total) by mouth daily. 04/21/20   [name], PA-C  sildenafil (VIAGRA) 100 MG tablet Take 0.5-1 tablets (50-100 mg total) by mouth daily as needed for erectile dysfunction. 04/30/20   Luiz Ochoa, NP    Allergies as of 05/13/2020  . (No Known Allergies)    Family History  Problem Relation Age of Onset  . Kidney Stones Mother   . Cancer Father   . Lupus Sister   . Cancer Maternal Grandmother   . Cancer Paternal Grandmother     Social History   Socioeconomic History  . Marital status: Married    Spouse name: Not on  file  . Number of children: Not on file  . Years of education: Not on file  . Highest education level: Not on file  Occupational History  . Not on file  Tobacco Use  . Smoking status: Former Smoker    Types: Cigarettes  . Smokeless tobacco: Never Used  . Tobacco comment: quit 11 years ago   Substance and Sexual Activity  . Alcohol use: Yes    Comment: 2 shots a month  . Drug use: Never    Comment: marijuana 20 years ago  . Sexual activity: Yes    Birth control/protection: Condom  Other Topics Concern  . Not on file  Social History Narrative  . Not on file   Social Determinants of Health   Financial Resource Strain: Not on file  Food Insecurity: Not on file  Transportation Needs: Not on file  Physical Activity: Not on file  Stress: Not on file  Social Connections: Not on file  Intimate Partner Violence: Not on file    Review of Systems: See HPI, otherwise negative ROS  Physical Exam: BP (!) 130/92   Pulse 67   Temp (!) 97.4 F (36.3 C) (Temporal)   Resp 16   Ht 5\' 5"  (1.651 m)   Wt 77.6 kg   SpO2 99%   BMI 28.47 kg/m  General:   Alert,  pleasant and cooperative in NAD Head:  Normocephalic and atraumatic. Neck:  Supple; no masses or thyromegaly. Lungs:  Clear throughout to auscultation, normal respiratory effort.    Heart:  +S1, +S2, Regular rate and rhythm, No edema. Abdomen:  Soft, nontender and nondistended. Normal bowel sounds, without guarding, and without rebound.   Neurologic:  Alert and  oriented x4;  grossly normal neurologically.  Impression/Plan: Timothy Moore is here for a colonoscopy to be performed for average risk screening and EGD for Acid Reflux, GERD, Barretts screening.  Risks, benefits, limitations, and alternatives regarding the procedures have been reviewed with the patient.  Questions have been answered.  All parties agreeable.   [name], MD  05/29/2020, 9:14 AM

## 2020-05-29 NOTE — Op Note (Signed)
[facility] Gastroenterology Patient Name: Timothy Moore Procedure Date: 05/29/2020 9:16 AM MRN: [id number] Account #: [id number] Date of Birth: 1965/04/25 Admit Type: Outpatient Age: 55 Room: [facility] ENDO ROOM 2 Gender: Male Note Status: Finalized Procedure:             Colonoscopy Indications:           Screening for colorectal malignant neoplasm Providers:             Anwar Sakata B. Bonna Gains MD, MD Medicines:             Monitored Anesthesia Care Complications:         No immediate complications. Procedure:             Pre-Anesthesia Assessment:                        - ASA Grade Assessment: II - A patient with mild                         systemic disease.                        - Prior to the procedure, a History and Physical was                         performed, and patient medications, allergies and                         sensitivities were reviewed. The patient's tolerance                         of previous anesthesia was reviewed.                        - The risks and benefits of the procedure and the                         sedation options and risks were discussed with the                         patient. All questions were answered and informed                         consent was obtained.                        - Patient identification and proposed procedure were                         verified prior to the procedure by the physician, the                         nurse, the anesthesiologist, the anesthetist and the                         technician. The procedure was verified in the                         procedure room.  After obtaining informed consent, the colonoscope was                         passed under direct vision. Throughout the procedure,                         the patient's blood pressure, pulse, and oxygen                         saturations were monitored continuously. The                         Colonoscope was  introduced through the anus and                         advanced to the the cecum, identified by appendiceal                         orifice and ileocecal valve. The colonoscopy was                         performed with ease. The patient tolerated the                         procedure well. The quality of the bowel preparation                         was good. Findings:      The perianal and digital rectal examinations were normal.      Three sessile polyps were found in the sigmoid colon. The polyps were 3       to 7 mm in size. These polyps were removed with a cold snare. Resection       and retrieval were complete.      The exam was otherwise without abnormality.      The rectum, sigmoid colon, descending colon, transverse colon, ascending       colon and cecum appeared normal.      Non-bleeding internal hemorrhoids were found during retroflexion. Impression:            - Three 3 to 7 mm polyps in the sigmoid colon, removed                         with a cold snare. Resected and retrieved.                        - The examination was otherwise normal.                        - The rectum, sigmoid colon, descending colon,                         transverse colon, ascending colon and cecum are normal.                        - Non-bleeding internal hemorrhoids. Recommendation:        - Discharge patient to home (with escort).                        -  Advance diet as tolerated.                        - Continue present medications.                        - Await pathology results.                        - Repeat colonoscopy date to be determined after                         pending pathology results are reviewed.                        - The findings and recommendations were discussed with                         the patient.                        - The findings and recommendations were discussed with                         the patient's family.                        - Return to  primary care physician as previously                         scheduled.                        - High fiber diet. Procedure Code(s):     --- Professional ---                        [phone], Colonoscopy, flexible; with removal of                         tumor(s), polyp(s), or other lesion(s) by snare                         technique Diagnosis Code(s):     --- Professional ---                        Z12.11, Encounter for screening for malignant neoplasm                         of colon                        K63.5, Polyp of colon CPT copyright 2019 American Medical Association. All rights reserved. The codes documented in this report are preliminary and upon coder review may  be revised to meet current compliance requirements.  [name], MD Margretta Sidle B. Bonna Gains MD, MD 05/29/2020 10:06:52 AM This report has been signed electronically. Number of Addenda: 0 Note Initiated On: 05/29/2020 9:16 AM Scope Withdrawal Time: 0 hours 14 minutes 21 seconds  Total Procedure Duration: 0 hours 16 minutes 55 seconds  Estimated Blood Loss:  Estimated blood loss: none.      [facility]

## 2020-05-29 NOTE — Anesthesia Preprocedure Evaluation (Signed)
Anesthesia Evaluation  Patient identified by MRN, date of birth, ID band Patient awake    Reviewed: Allergy & Precautions, H&P , NPO status , Patient's Chart, lab work & pertinent test results  History of Anesthesia Complications Negative for: history of anesthetic complications  Airway Mallampati: II  TM Distance: >3 FB     Dental  (+) Upper Dentures, Lower Dentures   Pulmonary sleep apnea (suspected) , neg COPD, former smoker,    breath sounds clear to auscultation       Cardiovascular hypertension, (-) angina(-) Past MI and (-) Cardiac Stents (-) dysrhythmias  Rhythm:regular Rate:Normal     Neuro/Psych negative neurological ROS  negative psych ROS   GI/Hepatic Neg liver ROS, GERD  ,  Endo/Other  negative endocrine ROS  Renal/GU negative Renal ROS  negative genitourinary   Musculoskeletal   Abdominal   Peds  Hematology negative hematology ROS (+)   Anesthesia Other Findings Past Medical History: No date: GERD (gastroesophageal reflux disease) No date: Hypertension  Past Surgical History: No date: HAND SURGERY; Left  BMI    Body Mass Index: 28.47 kg/m      Reproductive/Obstetrics negative OB ROS                             Anesthesia Physical Anesthesia Plan  ASA: II  Anesthesia Plan: General   Post-op Pain Management:    Induction:   PONV Risk Score and Plan: Propofol infusion and TIVA  Airway Management Planned: Nasal Cannula  Additional Equipment:   Intra-op Plan:   Post-operative Plan:   Informed Consent: I have reviewed the patients History and Physical, chart, labs and discussed the procedure including the risks, benefits and alternatives for the proposed anesthesia with the patient or authorized representative who has indicated his/her understanding and acceptance.     Dental Advisory Given  Plan Discussed with: Anesthesiologist, CRNA and  Surgeon  Anesthesia Plan Comments:         Anesthesia Quick Evaluation

## 2020-05-29 NOTE — Transfer of Care (Signed)
Immediate Anesthesia Transfer of Care Note  Patient: Jagdeep Ancheta  Procedure(s) Performed: ESOPHAGOGASTRODUODENOSCOPY (EGD) WITH PROPOFOL (N/A ) COLONOSCOPY WITH PROPOFOL (N/A )  Patient Location: Endoscopy Unit  Anesthesia Type:General  Level of Consciousness: drowsy  Airway & Oxygen Therapy: Patient Spontanous Breathing  Post-op Assessment: Report given to RN and Post -op Vital signs reviewed and stable  Post vital signs: Reviewed  Last Vitals:  Vitals Value Taken Time  BP    Temp    Pulse    Resp    SpO2      Last Pain:  Vitals:   05/29/20 0846  TempSrc: Temporal  PainSc: 0-No pain         Complications: No complications documented.

## 2020-05-29 NOTE — Op Note (Signed)
[facility] Gastroenterology Patient Name: Timothy Moore Procedure Date: 05/29/2020 9:17 AM MRN: [id number] Account #: [id number] Date of Birth: 12/28/1965 Admit Type: Outpatient Age: 55 Room: [facility] ENDO ROOM 2 Gender: Male Note Status: Finalized Procedure:             Upper GI endoscopy Indications:           Screening for Barrett's esophagus Providers:             Priscille Shadduck B. Bonna Gains MD, MD Medicines:             Monitored Anesthesia Care Complications:         No immediate complications. Procedure:             Pre-Anesthesia Assessment:                        - Prior to the procedure, a History and Physical was                         performed, and patient medications, allergies and                         sensitivities were reviewed. The patient's tolerance                         of previous anesthesia was reviewed.                        - The risks and benefits of the procedure and the                         sedation options and risks were discussed with the                         patient. All questions were answered and informed                         consent was obtained.                        - Patient identification and proposed procedure were                         verified prior to the procedure by the physician, the                         nurse, the anesthesiologist, the anesthetist and the                         technician. The procedure was verified in the                         procedure room.                        - ASA Grade Assessment: II - A patient with mild                         systemic disease.  After obtaining informed consent, the endoscope was                         passed under direct vision. Throughout the procedure,                         the patient's blood pressure, pulse, and oxygen                         saturations were monitored continuously. The Endoscope                         was  introduced through the mouth, and advanced to the                         second part of duodenum. The upper GI endoscopy was                         accomplished with ease. The patient tolerated the                         procedure well. Findings:      A widely patent and non-obstructing Schatzki ring was found at the       gastroesophageal junction.      The Z-line was irregular. Mucosa was biopsied with a cold forceps for       histology in a targeted manner and in 4 quadrants.      The exam of the esophagus was otherwise normal.      A small hiatal hernia was present.      Patchy mildly erythematous mucosa without bleeding was found in the       gastric antrum. Biopsies were taken with a cold forceps for histology.       Biopsies were obtained in the gastric body, at the incisura and in the       gastric antrum with cold forceps for histology.      The exam of the stomach was otherwise normal.      A single 4 mm mucosal nodule was found in the second portion of the       duodenum. Biopsies were taken with a cold forceps for histology.      The exam of the duodenum was otherwise normal.      The duodenal bulb, second portion of the duodenum and examined duodenum       were normal. Impression:            - Widely patent and non-obstructing Schatzki ring.                        - Z-line irregular. Biopsied.                        - Small hiatal hernia.                        - Erythematous mucosa in the antrum. Biopsied.                        - Mucosal nodule found in the duodenum. Biopsied.                        -  Normal duodenal bulb, second portion of the duodenum                         and examined duodenum.                        - Biopsies were obtained in the gastric body, at the                         incisura and in the gastric antrum. Recommendation:        - Await pathology results.                        - Discharge patient to home (with escort).                         - Advance diet as tolerated.                        - Continue present medications.                        - Patient has a contact number available for                         emergencies. The signs and symptoms of potential                         delayed complications were discussed with the patient.                         Return to normal activities tomorrow. Written                         discharge instructions were provided to the patient.                        - Discharge patient to home (with escort).                        - The findings and recommendations were discussed with                         the patient.                        - The findings and recommendations were discussed with                         the patient's family.                        - Follow an antireflux regimen. Procedure Code(s):     --- Professional ---                        [phone], Esophagogastroduodenoscopy, flexible,                         transoral; with biopsy, single or multiple Diagnosis Code(s):     --- Professional ---  K22.2, Esophageal obstruction                        K22.8, Other specified diseases of esophagus                        K44.9, Diaphragmatic hernia without obstruction or                         gangrene                        K31.89, Other diseases of stomach and duodenum                        Z13.810, Encounter for screening for upper                         gastrointestinal disorder CPT copyright 2019 American Medical Association. All rights reserved. The codes documented in this report are preliminary and upon coder review may  be revised to meet current compliance requirements.  [name], MD Margretta Sidle B. Bonna Gains MD, MD 05/29/2020 9:42:49 AM This report has been signed electronically. Number of Addenda: 0 Note Initiated On: 05/29/2020 9:17 AM Estimated Blood Loss:  Estimated blood loss: none.      [facility]

## 2020-05-31 ENCOUNTER — Encounter: Payer: Self-pay | Admitting: Hospice and Palliative Medicine

## 2020-05-31 NOTE — Anesthesia Postprocedure Evaluation (Signed)
Anesthesia Post Note  Patient: Stephenson Cichy  Procedure(s) Performed: ESOPHAGOGASTRODUODENOSCOPY (EGD) WITH PROPOFOL (N/A ) COLONOSCOPY WITH PROPOFOL (N/A )  Patient location during evaluation: PACU Anesthesia Type: General Level of consciousness: awake and alert Pain management: pain level controlled Vital Signs Assessment: post-procedure vital signs reviewed and stable Respiratory status: spontaneous breathing, nonlabored ventilation and respiratory function stable Cardiovascular status: blood pressure returned to baseline and stable Postop Assessment: no apparent nausea or vomiting Anesthetic complications: no   No complications documented.   Last Vitals:  Vitals:   05/29/20 1034 05/29/20 1044  BP: (!) 124/96 (!) 135/100  Pulse: 91 75  Resp: 18 20  Temp:    SpO2: 96% 99%    Last Pain:  Vitals:   05/29/20 1044  TempSrc:   PainSc: 0-No pain                 Brett Canales Rozelia Catapano

## 2020-06-05 ENCOUNTER — Encounter: Payer: Self-pay | Admitting: Hospice and Palliative Medicine

## 2020-06-06 ENCOUNTER — Other Ambulatory Visit: Payer: Self-pay | Admitting: Hospice and Palliative Medicine

## 2020-06-06 DIAGNOSIS — K219 Gastro-esophageal reflux disease without esophagitis: Secondary | ICD-10-CM

## 2020-06-12 ENCOUNTER — Other Ambulatory Visit: Payer: Self-pay | Admitting: Hospice and Palliative Medicine

## 2020-06-12 ENCOUNTER — Encounter: Payer: Self-pay | Admitting: Hospice and Palliative Medicine

## 2020-06-15 ENCOUNTER — Other Ambulatory Visit: Payer: Self-pay | Admitting: Gastroenterology

## 2020-06-15 ENCOUNTER — Encounter: Payer: Self-pay | Admitting: Gastroenterology

## 2020-06-15 DIAGNOSIS — K219 Gastro-esophageal reflux disease without esophagitis: Secondary | ICD-10-CM

## 2020-06-15 MED ORDER — OMEPRAZOLE 20 MG PO CPDR: 20.0000 mg | DELAYED_RELEASE_CAPSULE | Freq: Two times a day (BID) | ORAL | 0 refills | Status: DC

## 2020-06-15 MED ORDER — CLARITHROMYCIN 500 MG PO TABS: 500.0000 mg | ORAL_TABLET | Freq: Two times a day (BID) | ORAL | 0 refills | Status: AC

## 2020-06-15 MED ORDER — AMOXICILLIN 500 MG PO TABS: 1000.0000 mg | ORAL_TABLET | Freq: Two times a day (BID) | ORAL | 0 refills | Status: AC

## 2020-06-22 ENCOUNTER — Encounter: Payer: Self-pay | Admitting: Hospice and Palliative Medicine

## 2020-06-22 ENCOUNTER — Other Ambulatory Visit: Payer: Self-pay | Admitting: Hospice and Palliative Medicine

## 2020-06-22 MED ORDER — ONDANSETRON HCL 4 MG PO TABS: 4.0000 mg | ORAL_TABLET | Freq: Three times a day (TID) | ORAL | 0 refills | Status: DC | PRN

## 2020-06-23 ENCOUNTER — Encounter: Payer: Self-pay | Admitting: Hospice and Palliative Medicine

## 2020-06-30 ENCOUNTER — Encounter: Payer: Self-pay | Admitting: Hospice and Palliative Medicine

## 2020-06-30 ENCOUNTER — Telehealth: Payer: Self-pay

## 2020-06-30 ENCOUNTER — Telehealth: Payer: Self-pay | Admitting: Gastroenterology

## 2020-06-30 DIAGNOSIS — Z8619 Personal history of other infectious and parasitic diseases: Secondary | ICD-10-CM

## 2020-06-30 NOTE — Telephone Encounter (Signed)
Patient lvm about procedure results and new medication. Does not understand why and needs to understand. Please advise patient.

## 2020-06-30 NOTE — Telephone Encounter (Signed)
Pt sent my chart message asking for Lovena Le to call him.  I called pt and asked if I could help him.  Pt stated that he had a colonoscopy done and they sent him a email saying he had a stomach bacteria.  I advised pt that he needs to call the GI office and ask them what he needs to do.  They sent him abx to take but pt states he hasn't taken them yet.  Pt stated that he will call GI dr and find out what they needed.

## 2020-07-01 NOTE — Telephone Encounter (Signed)
Patient was contacted and answered all of his questions. Patient was also informed to come back to the clinic 4 weeks after her last dosage of his antibiotics to make sure that the H Pylori is gone. Patient stated that he understood and had no further questions. Patient was told to come to the office on the week of June 4th to get tested.

## 2020-07-04 ENCOUNTER — Encounter: Payer: Self-pay | Admitting: Hospice and Palliative Medicine

## 2020-07-20 ENCOUNTER — Other Ambulatory Visit: Payer: Self-pay | Admitting: Internal Medicine

## 2020-07-20 ENCOUNTER — Encounter: Payer: Self-pay | Admitting: Hospice and Palliative Medicine

## 2020-07-20 DIAGNOSIS — N529 Male erectile dysfunction, unspecified: Secondary | ICD-10-CM

## 2020-07-20 MED ORDER — SILDENAFIL CITRATE 100 MG PO TABS: 50.0000 mg | ORAL_TABLET | Freq: Every day | ORAL | 1 refills | Status: DC | PRN

## 2020-07-30 ENCOUNTER — Encounter: Payer: Self-pay | Admitting: Nurse Practitioner

## 2020-07-30 ENCOUNTER — Ambulatory Visit: Payer: No Typology Code available for payment source | Admitting: Nurse Practitioner

## 2020-07-30 ENCOUNTER — Other Ambulatory Visit: Payer: Self-pay

## 2020-07-30 DIAGNOSIS — I10 Essential (primary) hypertension: Secondary | ICD-10-CM

## 2020-07-30 DIAGNOSIS — K219 Gastro-esophageal reflux disease without esophagitis: Secondary | ICD-10-CM

## 2020-07-30 DIAGNOSIS — N529 Male erectile dysfunction, unspecified: Secondary | ICD-10-CM

## 2020-07-30 MED ORDER — TADALAFIL 10 MG PO TABS: 10.0000 mg | ORAL_TABLET | ORAL | 2 refills | Status: DC | PRN

## 2020-07-30 MED ORDER — OMEPRAZOLE 20 MG PO CPDR: 20.0000 mg | DELAYED_RELEASE_CAPSULE | Freq: Two times a day (BID) | ORAL | 2 refills | Status: DC

## 2020-07-30 MED ORDER — TADALAFIL 10 MG PO TABS: 10.0000 mg | ORAL_TABLET | ORAL | 1 refills | Status: DC | PRN

## 2020-07-30 NOTE — Progress Notes (Signed)
[facility] [place], [place] [address]  Internal MEDICINE  Office Visit Note  Patient Name: Timothy Moore  [id number]  [id number]  Date of Service: 07/30/2020  Chief Complaint  Patient presents with  . Follow-up  . Gastroesophageal Reflux  . Hypertension    HPI Cainen presents for follow up regarding gastroesophageal reflux disease and hypertension and medication for erectile dysfunction.  -acid reflux has improved with omeprazole. -Blood pressure 142/82. Taking amlodipine 5 mg daily; states he checks his blood pressure daily at home. He stays active and tries to eat health food and limit salt.  -was taking sildenafil for erectile dysfunction, states it is not working for him.     Current Medication: Outpatient Encounter Medications as of 07/30/2020  Medication Sig  . albuterol (VENTOLIN HFA) 108 (90 Base) MCG/ACT inhaler Inhale 1-2 puffs into the lungs every 4 (four) hours as needed for wheezing or shortness of breath.  Marland Kitchen amLODipine (NORVASC) 5 MG tablet Take 1 tablet (5 mg total) by mouth daily.  . cetirizine (ZYRTEC ALLERGY) 10 MG tablet Take 1 tablet (10 mg total) by mouth daily.  . Multiple Vitamin (MULTIVITAMIN) tablet Take 1 tablet by mouth daily.  . Na Sulfate-K Sulfate-Mg Sulf 17.5-3.13-1.6 GM/177ML SOLN At 5 PM the day before procedure take 1 bottle and 5 hours before procedure take 1 bottle.  . ondansetron (ZOFRAN) 4 MG tablet Take 1 tablet (4 mg total) by mouth every 8 (eight) hours as needed for nausea or vomiting.  . [DISCONTINUED] sildenafil (VIAGRA) 100 MG tablet Take 0.5-1 tablets (50-100 mg total) by mouth daily as needed for erectile dysfunction.  . [DISCONTINUED] tadalafil (CIALIS) 10 MG tablet Take 1 tablet (10 mg total) by mouth every other day as needed for erectile dysfunction.  Marland Kitchen omeprazole (PRILOSEC) 20 MG capsule Take 1 capsule (20 mg total) by mouth 2 (two) times daily before a meal.  . tadalafil (CIALIS) 10 MG tablet Take 1  tablet (10 mg total) by mouth every other day as needed for erectile dysfunction.  . [DISCONTINUED] omeprazole (PRILOSEC) 20 MG capsule Take 1 capsule (20 mg total) by mouth in the morning and at bedtime for 14 days.   No facility-administered encounter medications on file as of 07/30/2020.    Surgical History: Past Surgical History:  Procedure Laterality Date  . COLONOSCOPY WITH PROPOFOL N/A 05/29/2020   Procedure: COLONOSCOPY WITH PROPOFOL;  Surgeon: [name], MD;  Location: [facility] ENDOSCOPY;  Service: Endoscopy;  Laterality: N/A;  . ESOPHAGOGASTRODUODENOSCOPY (EGD) WITH PROPOFOL N/A 05/29/2020   Procedure: ESOPHAGOGASTRODUODENOSCOPY (EGD) WITH PROPOFOL;  Surgeon: [name], MD;  Location: [facility] ENDOSCOPY;  Service: Endoscopy;  Laterality: N/A;  . HAND SURGERY Left     Medical History: Past Medical History:  Diagnosis Date  . GERD (gastroesophageal reflux disease)   . Hypertension     Family History: Family History  Problem Relation Age of Onset  . Kidney Stones Mother   . Cancer Father   . Lupus Sister   . Cancer Maternal Grandmother   . Cancer Paternal Grandmother     Social History   Socioeconomic History  . Marital status: Married    Spouse name: Not on file  . Number of children: Not on file  . Years of education: Not on file  . Highest education level: Not on file  Occupational History  . Not on file  Tobacco Use  . Smoking status: Former Smoker    Types: Cigarettes  . Smokeless tobacco: Never  Used  . Tobacco comment: quit 11 years ago   Substance and Sexual Activity  . Alcohol use: Yes    Comment: 2 shots a month  . Drug use: Never    Comment: marijuana 20 years ago  . Sexual activity: Yes    Birth control/protection: Condom  Other Topics Concern  . Not on file  Social History Narrative  . Not on file   Social Determinants of Health   Financial Resource Strain: Not on file  Food Insecurity: Not on file  Transportation Needs:  Not on file  Physical Activity: Not on file  Stress: Not on file  Social Connections: Not on file  Intimate Partner Violence: Not on file      Review of Systems  Constitutional: Negative for chills, fatigue and unexpected weight change.  HENT: Positive for postnasal drip. Negative for congestion, rhinorrhea, sneezing and sore throat.   Eyes: Negative for redness.  Respiratory: Negative for cough, chest tightness and shortness of breath.   Cardiovascular: Negative for chest pain and palpitations.  Gastrointestinal: Negative for abdominal pain, constipation, diarrhea, nausea and vomiting.  Genitourinary: Negative for dysuria and frequency.  Musculoskeletal: Negative for arthralgias, back pain, joint swelling and neck pain.  Skin: Negative for rash.  Neurological: Negative.  Negative for tremors and numbness.  Hematological: Negative for adenopathy. Does not bruise/bleed easily.  Psychiatric/Behavioral: Negative for behavioral problems (Depression), sleep disturbance and suicidal ideas. The patient is not nervous/anxious.     Vital Signs: BP (!) 142/82   Pulse 89   Temp 98.9 F (37.2 C)   Resp 16   Ht 5\' 5"  (1.651 m)   Wt 169 lb (76.7 kg)   SpO2 92%   BMI 28.12 kg/m    Physical Exam Vitals reviewed.  Constitutional:      General: He is not in acute distress.    Appearance: Normal appearance. He is not ill-appearing.  HENT:     Head: Normocephalic and atraumatic.  Cardiovascular:     Rate and Rhythm: Normal rate and regular rhythm.     Pulses: Normal pulses.     Heart sounds: Normal heart sounds.  Pulmonary:     Effort: Pulmonary effort is normal.     Breath sounds: Normal breath sounds.  Musculoskeletal:     Cervical back: Normal range of motion and neck supple.  Skin:    General: Skin is warm and dry.     Capillary Refill: Capillary refill takes less than 2 seconds.  Neurological:     Mental Status: He is alert and oriented to person, place, and time.   Psychiatric:        Mood and Affect: Mood normal.        Behavior: Behavior normal.    Assessment/Plan: 1. Erectile dysfunction, unspecified erectile dysfunction type Sildenafil  Discontinued, Aurther will try tadalafil. Instructed him to call the clinic if it is not helping.  - tadalafil (CIALIS) 10 MG tablet; Take 1 tablet (10 mg total) by mouth every other day as needed for erectile dysfunction.  Dispense: 15 tablet; Refill: 2  2. Essential hypertension Blood pressure elevated but stable with amlodipine, will continue to recheck blood pressure every 3 months.   3. Gastroesophageal reflux disease without esophagitis Controlled with current medications - omeprazole (PRILOSEC) 20 MG capsule; Take 1 capsule (20 mg total) by mouth 2 (two) times daily before a meal.  Dispense: 60 capsule; Refill: 2   General Counseling: jadis mika understanding of the findings of todays visit and  agrees with plan of treatment. I have discussed any further diagnostic evaluation that may be needed or ordered today. We also reviewed his medications today. he has been encouraged to call the office with any questions or concerns that should arise related to todays visit.    No orders of the defined types were placed in this encounter.   Meds ordered this encounter  Medications  . DISCONTD: tadalafil (CIALIS) 10 MG tablet    Sig: Take 1 tablet (10 mg total) by mouth every other day as needed for erectile dysfunction.    Dispense:  15 tablet    Refill:  1  . omeprazole (PRILOSEC) 20 MG capsule    Sig: Take 1 capsule (20 mg total) by mouth 2 (two) times daily before a meal.    Dispense:  60 capsule    Refill:  2  . tadalafil (CIALIS) 10 MG tablet    Sig: Take 1 tablet (10 mg total) by mouth every other day as needed for erectile dysfunction.    Dispense:  15 tablet    Refill:  2   Return for F/U, med refill, Quenesha Douglass PCP.  Total time spent:30Minutes Time spent includes review of chart,  medications, test results, and follow up plan with the patient.   [place] Controlled Substance Database was reviewed by me.  This patient was seen by Jonetta Osgood, FNP-C in collaboration with Dr. Clayborn Bigness as a part of collaborative care agreement.   Dr Lavera Guise Internal medicine

## 2020-08-19 ENCOUNTER — Encounter: Payer: Self-pay | Admitting: Nurse Practitioner

## 2020-08-20 ENCOUNTER — Encounter: Payer: Self-pay | Admitting: Nurse Practitioner

## 2020-08-20 MED ORDER — TADALAFIL 20 MG PO TABS: 10.0000 mg | ORAL_TABLET | Freq: Every day | ORAL | 2 refills | Status: DC | PRN

## 2020-08-20 NOTE — Telephone Encounter (Signed)
Please review

## 2020-09-16 ENCOUNTER — Encounter: Payer: Self-pay | Admitting: Nurse Practitioner

## 2020-09-16 MED ORDER — AVANAFIL 100 MG PO TABS: 100.0000 mg | ORAL_TABLET | Freq: Every day | ORAL | 0 refills | Status: DC | PRN

## 2020-09-17 ENCOUNTER — Other Ambulatory Visit: Payer: Self-pay | Admitting: Internal Medicine

## 2020-09-17 ENCOUNTER — Other Ambulatory Visit: Payer: Self-pay | Admitting: Nurse Practitioner

## 2020-09-17 ENCOUNTER — Encounter: Payer: Self-pay | Admitting: Nurse Practitioner

## 2020-09-17 DIAGNOSIS — K219 Gastro-esophageal reflux disease without esophagitis: Secondary | ICD-10-CM

## 2020-09-17 DIAGNOSIS — I10 Essential (primary) hypertension: Secondary | ICD-10-CM

## 2020-09-17 MED ORDER — OMEPRAZOLE 20 MG PO CPDR: 20.0000 mg | DELAYED_RELEASE_CAPSULE | Freq: Two times a day (BID) | ORAL | 1 refills | Status: DC

## 2020-09-17 MED ORDER — AMLODIPINE BESYLATE 5 MG PO TABS: 5.0000 mg | ORAL_TABLET | Freq: Every day | ORAL | 1 refills | Status: DC

## 2020-09-17 MED ORDER — ALBUTEROL SULFATE HFA 108 (90 BASE) MCG/ACT IN AERS: 1.0000 | INHALATION_SPRAY | Freq: Four times a day (QID) | RESPIRATORY_TRACT | 3 refills | Status: DC | PRN

## 2020-09-17 MED ORDER — ONDANSETRON HCL 4 MG PO TABS: 4.0000 mg | ORAL_TABLET | Freq: Three times a day (TID) | ORAL | 0 refills | Status: DC | PRN

## 2020-09-17 MED ORDER — CETIRIZINE HCL 10 MG PO TABS: 10.0000 mg | ORAL_TABLET | Freq: Every day | ORAL | 2 refills | Status: DC

## 2020-09-17 NOTE — Telephone Encounter (Signed)
We need to refer him to urology

## 2020-09-17 NOTE — Telephone Encounter (Signed)
Please review and change if necessary

## 2020-09-22 ENCOUNTER — Encounter: Payer: Self-pay | Admitting: Nurse Practitioner

## 2020-09-22 ENCOUNTER — Other Ambulatory Visit: Payer: Self-pay | Admitting: Internal Medicine

## 2020-09-22 MED ORDER — SILDENAFIL CITRATE 100 MG PO TABS: 100.0000 mg | ORAL_TABLET | Freq: Every day | ORAL | 3 refills | Status: DC | PRN

## 2020-09-23 ENCOUNTER — Telehealth: Payer: Self-pay

## 2020-09-23 ENCOUNTER — Other Ambulatory Visit: Payer: Self-pay | Admitting: Internal Medicine

## 2020-09-23 MED ORDER — SILDENAFIL CITRATE 100 MG PO TABS: 100.0000 mg | ORAL_TABLET | Freq: Every day | ORAL | 3 refills | Status: DC | PRN

## 2020-09-23 NOTE — Telephone Encounter (Signed)
Spoke to pt and informed him that prescription for Sildenafil 100 mg was sent to pharmacy today 09/23/20 and was ready to be picked up.

## 2020-09-23 NOTE — Telephone Encounter (Signed)
Can u please take care of this

## 2020-11-01 IMAGING — CT CT HEAD W/O CM
3 series · 14 of 47 positions shown, 16 images · non-contrast
Comparison: None.

CLINICAL DATA: 53-year-old who fell approximately 5 feet off of a
ladder around 2 o'clock this afternoon, striking his head. Pain near
the vertex. Neck stiffness. No loss of consciousness. Initial
encounter.

EXAM:
CT HEAD WITHOUT CONTRAST
CT CERVICAL SPINE WITHOUT CONTRAST
TECHNIQUE: Multidetector CT imaging of the head and cervical spine was
performed following the standard protocol without intravenous
contrast. Multiplanar CT image reconstructions of the cervical spine
were also generated.

[Series 2: head wo · axial · 0.46mm/px · z∈[-199,-74]mm · 8 of 30 slices shown, 10 images]
[im 3/30  brain]
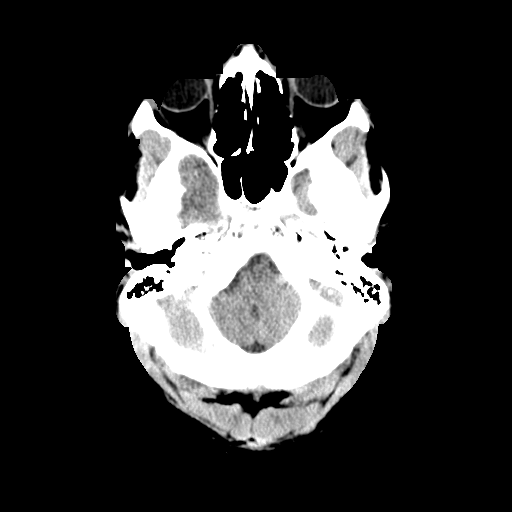
[im 3/30  bone]
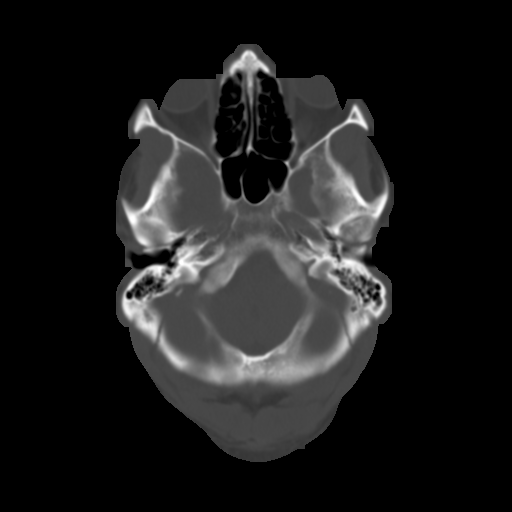
[im 7/30  brain]
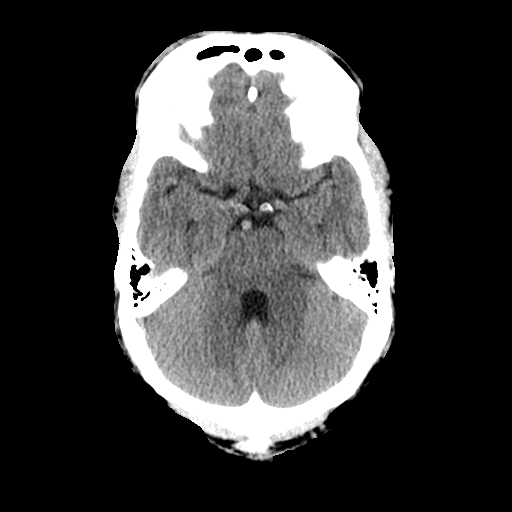
[im 10/30  brain]
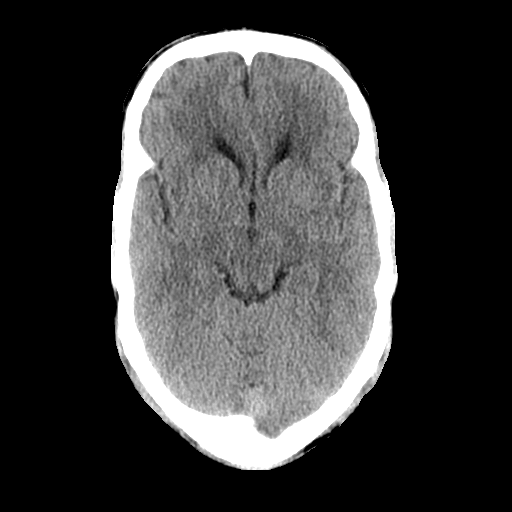
[im 14/30  brain]
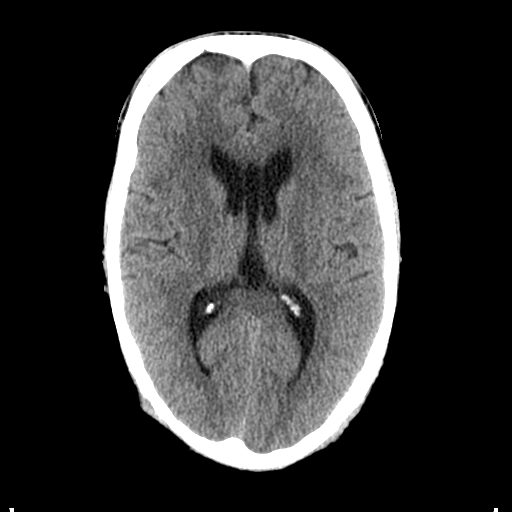
[im 17/30  brain]
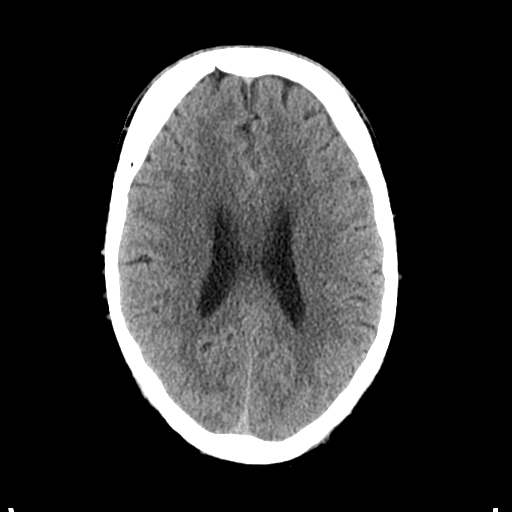
[im 17/30  bone]
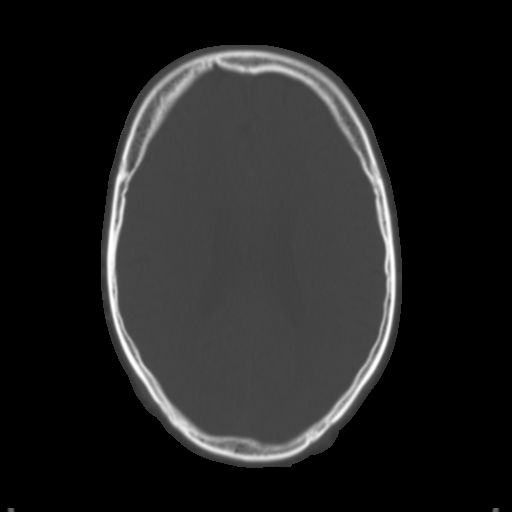
[im 21/30  brain]
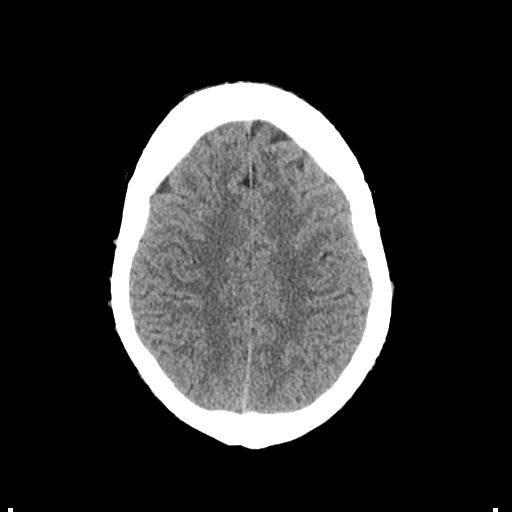
[im 24/30  brain]
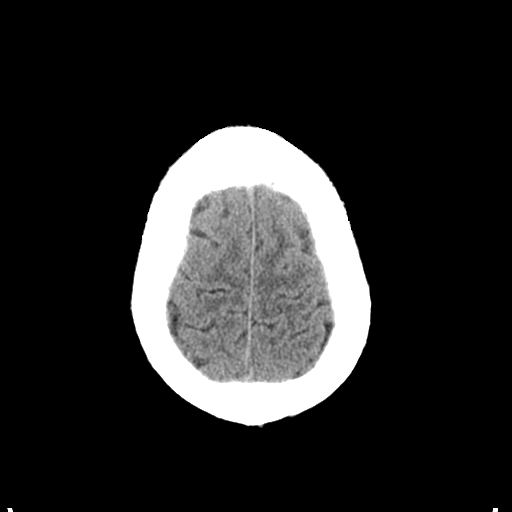
[im 28/30  brain]
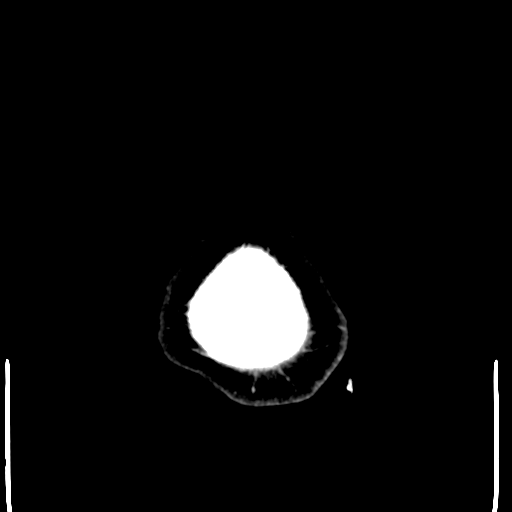

[Series 4: coronal soft tissue · coronal · 0.31mm/px · 3 of 71 slices shown]
[im 24/71  brain]
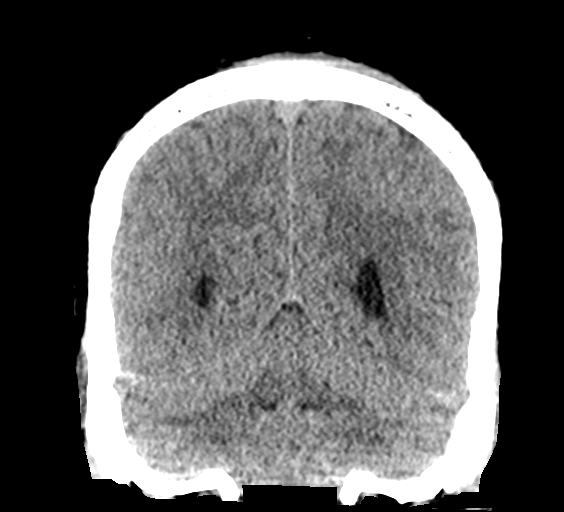
[im 32/71  brain]
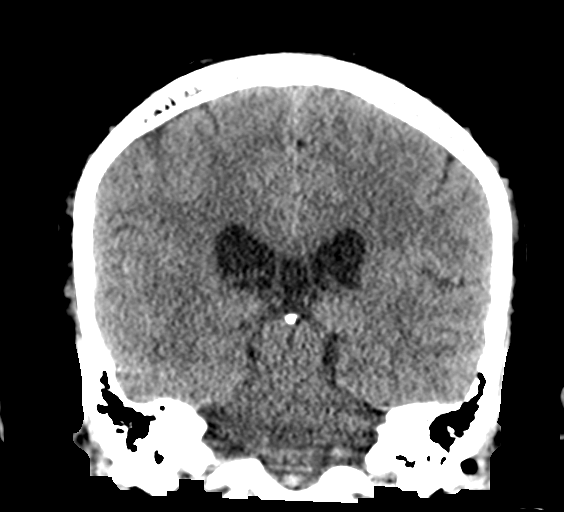
[im 39/71  brain]
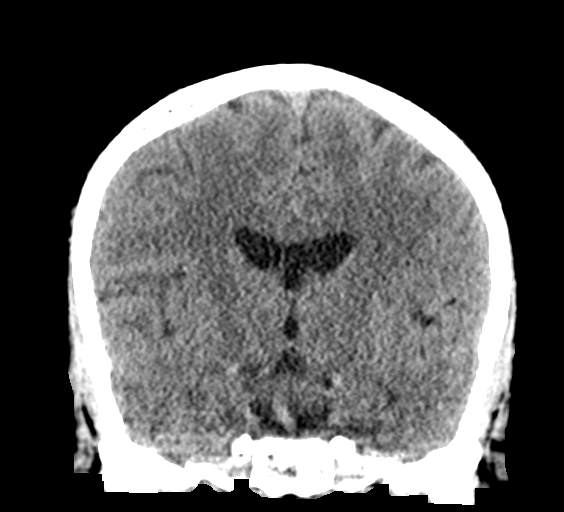

[Series 5: sagittal soft tissue · sagittal · 0.31mm/px · 3 of 51 slices shown]
[im 17/51  brain]
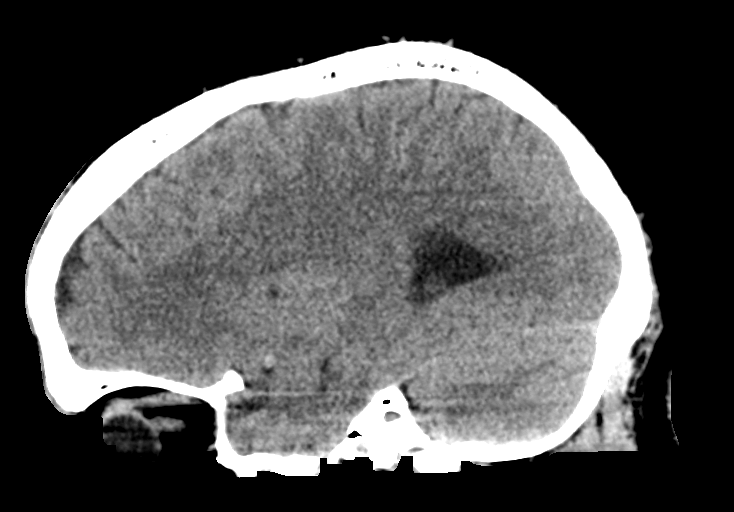
[im 26/51  brain]
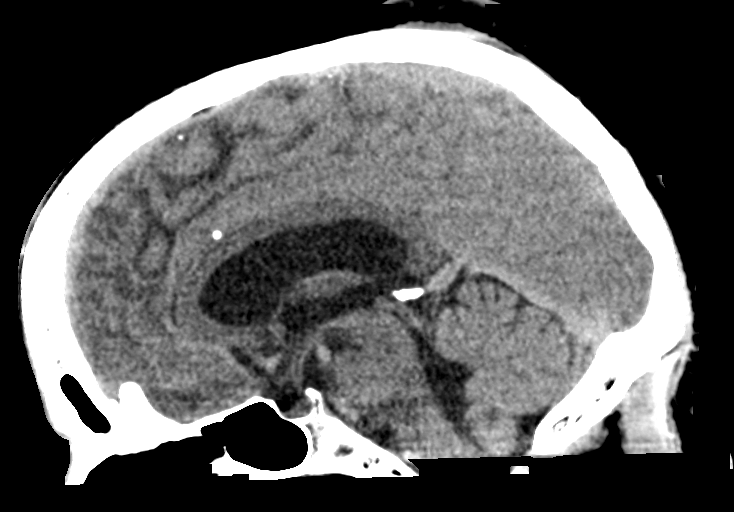
[im 34/51  brain]
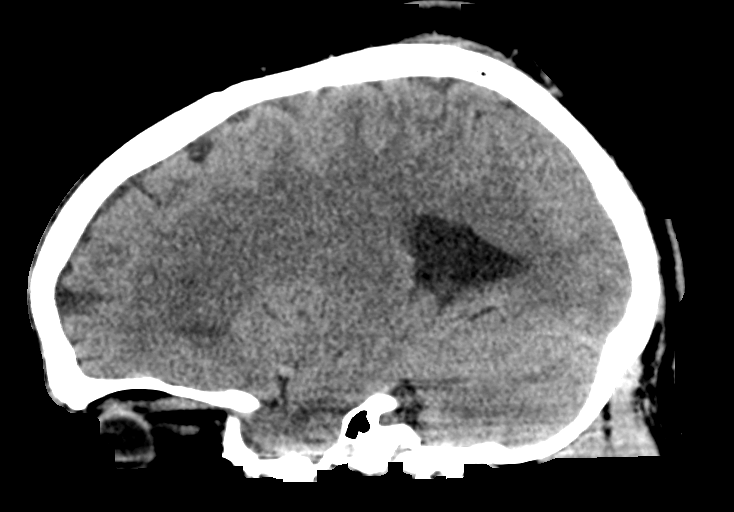

[14 of 47 positions shown; findings below may reference images not displayed]

FINDINGS: CT HEAD FINDINGS

Brain: Ventricular system normal in size and appearance for age.
Cavum septum pellucidum and cavum vergae, normal anatomic variants.
No mass lesion. No midline shift. No acute hemorrhage or hematoma.
No extra-axial fluid collections. No evidence of acute infarction.

Vascular: No hyperdense vessel. No visible atherosclerosis.

Skull: No skull fracture or other focal osseous abnormality
involving the skull.

Sinuses/Orbits: Visualized paranasal sinuses, bilateral mastoid air
cells and bilateral middle ear cavities well-aerated. Visualized
orbits and globes normal in appearance.

Other: None.

CT CERVICAL SPINE FINDINGS

Alignment: Anatomic posterior alignment. Straightening of the usual
cervical lordosis. Facet joints anatomically aligned throughout.

Skull base and vertebrae: No fractures identified involving the
cervical spine. Coronal reformatted images demonstrate an intact
craniocervical junction, intact dens and intact lateral masses
throughout.

Soft tissues and spinal canal: No evidence of paraspinous or spinal
canal hematoma. No evidence of spinal stenosis.

Disc levels: Mild central disc protrusion at C4-5. Mild disc space
narrowing at C5-6 and moderate disc space narrowing at C7-T1.
Calcification in the ANTERIOR annular fibers at C4-5, C5-6 and C6-7.
Mild facet and uncinate hypertrophy account for multilevel foraminal
stenoses including mild RIGHT and moderate LEFT C2-3, moderate LEFT
and mild RIGHT C5-6.

Upper chest: Visualized lung apices clear. Visualized superior
mediastinum normal.

Other: None.
IMPRESSION: 1. Normal unenhanced CT of the brain.
2. No cervical spine fractures identified.
3. Mild central disc protrusion at C4-5. Multilevel degenerative
disc disease, spondylosis and foraminal stenoses as detailed above.

## 2020-11-03 ENCOUNTER — Other Ambulatory Visit: Payer: Self-pay

## 2020-11-03 ENCOUNTER — Ambulatory Visit: Payer: No Typology Code available for payment source | Admitting: Nurse Practitioner

## 2020-11-03 ENCOUNTER — Encounter: Payer: Self-pay | Admitting: Nurse Practitioner

## 2020-11-03 VITALS — BP 122/90 | HR 70 | Temp 97.3°F | Resp 16 | Ht 65.0 in | Wt 167.4 lb

## 2020-11-03 DIAGNOSIS — G479 Sleep disorder, unspecified: Secondary | ICD-10-CM

## 2020-11-03 DIAGNOSIS — G4719 Other hypersomnia: Secondary | ICD-10-CM

## 2020-11-03 DIAGNOSIS — N529 Male erectile dysfunction, unspecified: Secondary | ICD-10-CM

## 2020-11-03 NOTE — Progress Notes (Addendum)
[facility] [place], [place] [address]  Internal MEDICINE  Office Visit Note  Patient Name: Timothy Moore  [id number]  [id number]  Date of Service: 11/03/2020  Chief Complaint  Patient presents with   Follow-up    Will nod off and frequently, happens when pt slows down and is relaxed, had happened at stop lights and in the waiting room   Gastroesophageal Reflux   Hypertension    HPI Timothy Moore presents for a follow up visit for erectile dysfunction and falling asleep during the day. He reports nodding off frequently, when sitting quietly such as in the waiting room or even when driving and stopped at a red light.  Continues to have elevated BP ( 22 [place] ss)   Current Medication: Outpatient Encounter Medications as of 11/03/2020  Medication Sig   albuterol (VENTOLIN HFA) 108 (90 Base) MCG/ACT inhaler Inhale 1-2 puffs into the lungs every 6 (six) hours as needed for wheezing or shortness of breath.   amLODipine (NORVASC) 5 MG tablet Take 1 tablet (5 mg total) by mouth daily.   cetirizine (ZYRTEC ALLERGY) 10 MG tablet Take 1 tablet (10 mg total) by mouth daily.   Multiple Vitamin (MULTIVITAMIN) tablet Take 1 tablet by mouth daily.   Na Sulfate-K Sulfate-Mg Sulf 17.5-3.13-1.6 GM/177ML SOLN At 5 PM the day before procedure take 1 bottle and 5 hours before procedure take 1 bottle.   omeprazole (PRILOSEC) 20 MG capsule Take 1 capsule (20 mg total) by mouth 2 (two) times daily before a meal.   sildenafil (VIAGRA) 100 MG tablet Take 1 tablet (100 mg total) by mouth daily as needed for erectile dysfunction.   [DISCONTINUED] ondansetron (ZOFRAN) 4 MG tablet Take 1 tablet (4 mg total) by mouth every 8 (eight) hours as needed for nausea or vomiting. (Patient not taking: Reported on 11/03/2020)   No facility-administered encounter medications on file as of 11/03/2020.    Surgical History: Past Surgical History:  Procedure Laterality Date   COLONOSCOPY WITH PROPOFOL N/A  05/29/2020   Procedure: COLONOSCOPY WITH PROPOFOL;  Surgeon: [name], MD;  Location: [facility] ENDOSCOPY;  Service: Endoscopy;  Laterality: N/A;   ESOPHAGOGASTRODUODENOSCOPY (EGD) WITH PROPOFOL N/A 05/29/2020   Procedure: ESOPHAGOGASTRODUODENOSCOPY (EGD) WITH PROPOFOL;  Surgeon: [name], MD;  Location: [facility] ENDOSCOPY;  Service: Endoscopy;  Laterality: N/A;   HAND SURGERY Left     Medical History: Past Medical History:  Diagnosis Date   GERD (gastroesophageal reflux disease)    Hypertension     Family History: Family History  Problem Relation Age of Onset   Kidney Stones Mother    Cancer Father    Lupus Sister    Cancer Maternal Grandmother    Cancer Paternal Grandmother     Social History   Socioeconomic History   Marital status: Married    Spouse name: Not on file   Number of children: Not on file   Years of education: Not on file   Highest education level: Not on file  Occupational History   Not on file  Tobacco Use   Smoking status: Former    Types: Cigarettes   Smokeless tobacco: Never   Tobacco comments:    quit 11 years ago   Substance and Sexual Activity   Alcohol use: Yes    Comment: 2 shots a month   Drug use: Never    Comment: marijuana 20 years ago   Sexual activity: Yes    Birth control/protection: Condom  Other Topics Concern  Not on file  Social History Narrative   Not on file   Social Determinants of Health   Financial Resource Strain: Not on file  Food Insecurity: Not on file  Transportation Needs: Not on file  Physical Activity: Not on file  Stress: Not on file  Social Connections: Not on file  Intimate Partner Violence: Not on file      Review of Systems  Constitutional:  Positive for activity change (sleepy during sedentary periods of time.). Negative for chills, fatigue and unexpected weight change.  HENT: Negative.  Negative for congestion, rhinorrhea, sneezing and sore throat.   Eyes:  Negative for redness.   Respiratory: Negative.  Negative for cough, chest tightness, shortness of breath and wheezing.   Cardiovascular: Negative.  Negative for chest pain and palpitations.  Gastrointestinal:  Negative for abdominal pain, constipation, diarrhea, nausea and vomiting.  Genitourinary:  Negative for dysuria and frequency.  Musculoskeletal:  Negative for arthralgias, back pain, joint swelling and neck pain.  Skin:  Negative for rash.  Neurological: Negative.  Negative for dizziness, tremors, light-headedness, numbness and headaches.       Reports excessive sleepiness during the day, see HPI.   Hematological:  Negative for adenopathy. Does not bruise/bleed easily.  Psychiatric/Behavioral:  Negative for behavioral problems (Depression), sleep disturbance and suicidal ideas. The patient is not nervous/anxious.    Vital Signs: BP 122/90   Pulse 70   Temp (!) 97.3 F (36.3 C)   Resp 16   Ht '5\' 5"'$  (1.651 m)   Wt 167 lb 6.4 oz (75.9 kg)   SpO2 98%   BMI 27.86 kg/m    Physical Exam Vitals reviewed.  Constitutional:      General: He is not in acute distress.    Appearance: Normal appearance. He is normal weight. He is not ill-appearing.  HENT:     Head: Normocephalic and atraumatic.  Eyes:     Extraocular Movements: Extraocular movements intact.     Pupils: Pupils are equal, round, and reactive to light.  Pulmonary:     Effort: Pulmonary effort is normal. No respiratory distress.  Neurological:     Mental Status: He is alert and oriented to person, place, and time.  Psychiatric:        Mood and Affect: Mood normal.        Behavior: Behavior normal.     Assessment/Plan: 1. Excessive daytime sleepiness Sleep study ordered. - PSG Sleep Study; Future Patient has sign and symptoms of OSA ( disturbed sleep, excessive fatigue during the day, erectile dysfunction, uncontrolled bp and abnormal BMI). Baseline sleep study is ordered to further look into this. Long term complications of OSA was  addressed with the patient.   2. Erectile dysfunction, unspecified erectile dysfunction type Tried sildenafil and tadalafil but he reports that neither medication has helped. Refer to urology.  - Ambulatory referral to Urology   General Counseling: Timothy Moore understanding of the findings of todays visit and agrees with plan of treatment. I have discussed any further diagnostic evaluation that may be needed or ordered today. We also reviewed his medications today. he has been encouraged to call the office with any questions or concerns that should arise related to todays visit.    Orders Placed This Encounter  Procedures   Ambulatory referral to Urology   PSG Sleep Study    No orders of the defined types were placed in this encounter.   Return in about 2 months (around 01/03/2021) for F/U, Maegen Wigle PCP.  Total time spent:20 Minutes Time spent includes review of chart, medications, test results, and follow up plan with the patient.   [place] Controlled Substance Database was reviewed by me.  This patient was seen by Jonetta Osgood, FNP-C in collaboration with Dr. Clayborn Bigness as a part of collaborative care agreement.   Quamir Willemsen R. Valetta Fuller, MSN, FNP-C Internal medicine

## 2020-11-12 ENCOUNTER — Ambulatory Visit: Payer: No Typology Code available for payment source | Admitting: Gastroenterology

## 2020-11-17 NOTE — Addendum Note (Signed)
Addended by: Lavera Guise on: 11/17/2020 09:03 AM   Modules accepted: Level of Service

## 2020-11-19 ENCOUNTER — Encounter: Payer: Self-pay | Admitting: Gastroenterology

## 2020-11-19 ENCOUNTER — Ambulatory Visit (INDEPENDENT_AMBULATORY_CARE_PROVIDER_SITE_OTHER): Payer: Self-pay | Admitting: Gastroenterology

## 2020-11-19 ENCOUNTER — Other Ambulatory Visit: Payer: Self-pay

## 2020-11-19 VITALS — BP 125/84 | HR 87 | Temp 98.1°F | Ht 65.0 in | Wt 170.0 lb

## 2020-11-19 DIAGNOSIS — K219 Gastro-esophageal reflux disease without esophagitis: Secondary | ICD-10-CM

## 2020-11-19 DIAGNOSIS — R109 Unspecified abdominal pain: Secondary | ICD-10-CM

## 2020-11-19 DIAGNOSIS — Z8619 Personal history of other infectious and parasitic diseases: Secondary | ICD-10-CM

## 2020-11-19 MED ORDER — OMEPRAZOLE 20 MG PO CPDR: 20.0000 mg | DELAYED_RELEASE_CAPSULE | Freq: Every day | ORAL | 1 refills | Status: DC

## 2020-11-19 NOTE — Progress Notes (Addendum)
Timothy Antigua, MD [address]  [place]  [place], [place] [address]  Main: [phone]  Fax: [phone]   Primary Care Physician: Timothy Osgood, NP   Chief complaint: Heartburn  HPI: Timothy Moore is a 55 y.o. male here for follow-up of heartburn, abdominal pain.  Patient states as long as he is taking omeprazole once a day, his heartburn is well controlled.  If he misses his medication, he will notice symptoms within 3 to 4 days, consisting of burning sensation in chest, indigestion.   No further abdominal pain.  Patient had H. pylori on biopsies of the stomach which was treated.  Denies any dysphagia, no nausea or vomiting.  Good appetite.  No altered bowel habits.  PCP notes by Timothy Moore from May 2022 reviewed and states that patient had reported improvement in acid reflux with omeprazole on that visit.  They had refilled the medication at that time   ROS: All ROS reviewed and negative except as per HPI   Past Medical History:  Diagnosis Date   GERD (gastroesophageal reflux disease)    Hypertension     Past Surgical History:  Procedure Laterality Date   COLONOSCOPY WITH PROPOFOL N/A 05/29/2020   Procedure: COLONOSCOPY WITH PROPOFOL;  Surgeon: Timothy Manifold, MD;  Location: [facility]COPY;  Service: Endoscopy;  Laterality: N/A;   ESOPHAGOGASTRODUODENOSCOPY (EGD) WITH PROPOFOL N/A 05/29/2020   Procedure: ESOPHAGOGASTRODUODENOSCOPY (EGD) WITH PROPOFOL;  Surgeon: Timothy Manifold, MD;  Location: [facility];  Service: Endoscopy;  Laterality: N/A;   HAND SURGERY Left     Prior to Admission medications   Medication Sig Start Date End Date Taking? Authorizing Provider  amLODipine (NORVASC) 5 MG tablet Take 1 tablet (5 mg total) by mouth daily. 09/17/20  Yes Timothy Guise, MD  cetirizine (ZYRTEC ALLERGY) 10 MG tablet Take 1 tablet (10 mg total) by mouth daily. 09/17/20  Yes Timothy Guise, MD  Multiple Vitamin (MULTIVITAMIN) tablet Take 1 tablet by  mouth daily.   Yes [provider]  omeprazole (PRILOSEC) 20 MG capsule Take 1 capsule (20 mg total) by mouth daily. 11/19/20  Yes Timothy Manifold, MD  sildenafil (VIAGRA) 100 MG tablet Take 1 tablet (100 mg total) by mouth daily as needed for erectile dysfunction. 09/23/20  Yes Timothy Guise, MD    Family History  Problem Relation Age of Onset   Kidney Stones Mother    Cancer Father    Lupus Sister    Cancer Maternal Grandmother    Cancer Paternal Grandmother      Social History   Tobacco Use   Smoking status: Former    Types: Cigarettes   Smokeless tobacco: Never   Tobacco comments:    quit 11 years ago   Substance Use Topics   Alcohol use: Yes    Comment: 2 shots a month   Drug use: Never    Comment: marijuana 20 years ago    Allergies as of 11/19/2020   (No Known Allergies)    Physical Examination:  Constitutional: General:   Alert,  Well-developed, well-nourished, pleasant and cooperative in NAD BP 125/84   Pulse 87   Temp 98.1 F (36.7 C) (Oral)   Ht '5\' 5"'$  (1.651 m)   Wt 170 lb (77.1 kg)   BMI 28.29 kg/m   Respiratory: Normal respiratory effort  Gastrointestinal:  Soft, non-tender and non-distended without masses, hepatosplenomegaly or hernias noted.  No guarding or rebound tenderness.     Cardiac: No clubbing or  edema.  No cyanosis. Normal posterior tibial pedal pulses noted.  Psych:  Alert and cooperative. Normal mood and affect.  Musculoskeletal:  Normal gait. Head normocephalic, atraumatic. Symmetrical without gross deformities. 5/5 Lower extremity strength bilaterally.  Skin: Warm. Intact without significant lesions or rashes. No jaundice.  Neck: Supple, trachea midline  Lymph: No cervical lymphadenopathy  Psych:  Alert and oriented x3, Alert and cooperative. Normal mood and affect.  Labs: CMP     Component Value Date/Time   NA 137 03/24/2020 0802   K 4.4 03/24/2020 0802   CL 101 03/24/2020 0802   CO2 21 03/24/2020 0802    GLUCOSE 109 (H) 03/24/2020 0802   BUN 11 03/24/2020 0802   CREATININE 1.11 03/24/2020 0802   CALCIUM 9.7 03/24/2020 0802   PROT 7.5 03/24/2020 0802   ALBUMIN 4.7 03/24/2020 0802   AST 38 03/24/2020 0802   ALT 24 03/24/2020 0802   ALKPHOS 110 03/24/2020 0802   BILITOT 0.6 03/24/2020 0802   GFRNONAA 76 03/24/2020 0802   GFRAA 88 03/24/2020 0802   Lab Results  Component Value Date   WBC 6.0 03/24/2020   HGB 14.7 03/24/2020   HCT 42.1 03/24/2020   MCV 84 03/24/2020   PLT 284 03/24/2020    Imaging Studies:   Assessment and Plan:   Timothy Moore is a 55 y.o. y/o male here for follow-up of H. pylori, abdominal pain, reflux  H. pylori has been treated Breath test cannot be done at this time as patient has been taking his PPI with last dose today Will check H. 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  He is on omeprazole 20 mg once a day at this time and is on low-dose already and this is controlling his symptoms well  (Risks of PPI use were discussed with patient including bone loss, C. Diff diarrhea, pneumonia, infections, CKD, electrolyte abnormalities.  Pt. Verbalizes understanding and chooses to continue the medication.)  Patient educated extensively on acid reflux lifestyle modification, including using a bed wedge, not eating 3 hrs before bedtime, diet modifications, and handout given for the same.     Dr Timothy Moore

## 2020-11-20 ENCOUNTER — Telehealth: Payer: Self-pay

## 2020-11-20 NOTE — Telephone Encounter (Signed)
PSG order sent to [facility]. 

## 2020-11-22 LAB — H PYLORI, IGM, IGG, IGA AB
H pylori, IgM Abs: <9 units (ref 0.0–8.9)
H. pylori, IgA Abs: 30.4 units — ABNORMAL HIGH (ref 0.0–8.9)
H. pylori, IgG AbS: 1.97 Index Value — ABNORMAL HIGH (ref 0.00–0.79)

## 2020-11-30 ENCOUNTER — Encounter: Payer: Self-pay | Admitting: Urology

## 2020-11-30 ENCOUNTER — Ambulatory Visit (INDEPENDENT_AMBULATORY_CARE_PROVIDER_SITE_OTHER): Payer: Self-pay | Admitting: Urology

## 2020-11-30 ENCOUNTER — Other Ambulatory Visit: Payer: Self-pay

## 2020-11-30 VITALS — BP 132/82 | HR 84 | Ht 67.0 in | Wt 170.0 lb

## 2020-11-30 DIAGNOSIS — N500 Atrophy of testis: Secondary | ICD-10-CM

## 2020-11-30 DIAGNOSIS — N529 Male erectile dysfunction, unspecified: Secondary | ICD-10-CM

## 2020-11-30 NOTE — Progress Notes (Signed)
11/30/2020 2:34 PM   Lennart Pall 1965-11-20 [id number]  Referring provider: Jonetta Osgood, NP [place],  [place] [address]  Chief Complaint  Patient presents with   Erectile Dysfunction    HPI: Timothy Moore is a 55 y.o. male referred for evaluation of erectile dysfunction.  1.5-year history difficulty achieving and maintaining an erection Currently with partial erections which are occasionally firm enough for penetration Has tried both sildenafil 100 mg and tadalafil 20 mg without significant improvement Does relate to significant tiredness, fatigue and low libido Testosterone level has not been checked Organic risk factors include hypertension, antihypertensive medications.  He smoked for 10 years <1 pack/day and quit 11 years ago   PMH: Past Medical History:  Diagnosis Date   GERD (gastroesophageal reflux disease)    Hypertension     Surgical History: Past Surgical History:  Procedure Laterality Date   COLONOSCOPY WITH PROPOFOL N/A 05/29/2020   Procedure: COLONOSCOPY WITH PROPOFOL;  Surgeon: [name], MD;  Location: [facility];  Service: Endoscopy;  Laterality: N/A;   ESOPHAGOGASTRODUODENOSCOPY (EGD) WITH PROPOFOL N/A 05/29/2020   Procedure: ESOPHAGOGASTRODUODENOSCOPY (EGD) WITH PROPOFOL;  Surgeon: [name], MD;  Location: [facility];  Service: Endoscopy;  Laterality: N/A;   HAND SURGERY Left     Home Medications:  Allergies as of 11/30/2020   No Known Allergies      Medication List        Accurate as of November 30, 2020  2:34 PM. If you have any questions, ask your nurse or doctor.          amLODipine 5 MG tablet Commonly known as: NORVASC Take 1 tablet (5 mg total) by mouth daily.   cetirizine 10 MG tablet Commonly known as: ZyrTEC Allergy Take 1 tablet (10 mg total) by mouth daily.   multivitamin tablet Take 1 tablet by mouth daily.   omeprazole 20 MG capsule Commonly known as: PRILOSEC Take  1 capsule (20 mg total) by mouth daily.   sildenafil 100 MG tablet Commonly known as: Viagra Take 1 tablet (100 mg total) by mouth daily as needed for erectile dysfunction.        Allergies: No Known Allergies  Family History: Family History  Problem Relation Age of Onset   Kidney Stones Mother    Cancer Father    Lupus Sister    Cancer Maternal Grandmother    Cancer Paternal Grandmother     Social History:  reports that he has quit smoking. His smoking use included cigarettes. He has never used smokeless tobacco. He reports current alcohol use. He reports that he does not use drugs.   Physical Exam: BP 132/82   Pulse 84   Ht '5\' 7"'$  (1.702 m)   Wt 170 lb (77.1 kg)   BMI 26.63 kg/m   Constitutional:  Alert and oriented, No acute distress. HEENT: [place] AT, moist mucus membranes.  Trachea midline, no masses. Cardiovascular: No clubbing, cyanosis, or edema. Respiratory: Normal respiratory effort, no increased work of breathing. GU: Phallus circumcised without lesions testes descended bilaterally left testis atrophic ~ 4 cc volume; atrophic right testis ~ 6 cc volume Neurologic: Grossly intact, no focal deficits, moving all 4 extremities. Psychiatric: Normal mood and affect.   Assessment & Plan:    1.  Erectile dysfunction PDE 5 inhibitor refractory Organic risk factors include hypertension, antihypertensive medications and tobacco history Does note tiredness, fatigue and significant decreased libido Check testosterone level/LH  2.  Testicular atrophy Testosterone level/LH as above Will  call with results/further recommendations after lab review   Abbie Sons, [place] [address], [place],  [address] [phone]

## 2020-12-01 ENCOUNTER — Encounter: Payer: Self-pay | Admitting: Urology

## 2020-12-01 LAB — TESTOSTERONE: Testosterone: 312 ng/dL (ref 264–916)

## 2020-12-01 LAB — LUTEINIZING HORMONE: LH: 14 m[IU]/mL — ABNORMAL HIGH (ref 1.7–8.6)

## 2020-12-03 ENCOUNTER — Encounter: Payer: Self-pay | Admitting: Urology

## 2020-12-03 LAB — SPECIMEN STATUS REPORT

## 2020-12-03 LAB — TESTOSTERONE FREE, PROFILE I
Albumin: 4.7 g/dL (ref 3.8–4.9)
Sex Hormone Binding: 27.8 nmol/L (ref 19.3–76.4)
Testost., Free, Calc: 65.4 pg/mL (ref 35.8–168.2)
Testosterone: 315 ng/dL (ref 264–916)

## 2020-12-04 ENCOUNTER — Telehealth: Payer: Self-pay

## 2020-12-04 NOTE — Telephone Encounter (Signed)
Patient was scheduled a PSG study for Sunday December 06, 2020.

## 2020-12-09 ENCOUNTER — Encounter: Payer: Self-pay | Admitting: *Deleted

## 2020-12-17 ENCOUNTER — Telehealth: Payer: Self-pay

## 2020-12-17 NOTE — Telephone Encounter (Signed)
Patient canceled appointment with feeling great due to the patients insurance not covering PSG.

## 2020-12-19 ENCOUNTER — Other Ambulatory Visit: Payer: Self-pay | Admitting: Internal Medicine

## 2020-12-19 DIAGNOSIS — N529 Male erectile dysfunction, unspecified: Secondary | ICD-10-CM

## 2021-01-04 ENCOUNTER — Encounter: Payer: Self-pay | Admitting: Nurse Practitioner

## 2021-01-04 ENCOUNTER — Ambulatory Visit: Payer: PRIVATE HEALTH INSURANCE | Admitting: Nurse Practitioner

## 2021-01-04 ENCOUNTER — Other Ambulatory Visit: Payer: Self-pay

## 2021-01-04 VITALS — BP 140/90 | HR 84 | Temp 98.1°F | Resp 16 | Ht 65.0 in | Wt 173.0 lb

## 2021-01-04 DIAGNOSIS — N529 Male erectile dysfunction, unspecified: Secondary | ICD-10-CM

## 2021-01-04 DIAGNOSIS — I10 Essential (primary) hypertension: Secondary | ICD-10-CM | POA: Diagnosis not present

## 2021-01-04 DIAGNOSIS — R7301 Impaired fasting glucose: Secondary | ICD-10-CM | POA: Diagnosis not present

## 2021-01-04 DIAGNOSIS — G4719 Other hypersomnia: Secondary | ICD-10-CM

## 2021-01-04 DIAGNOSIS — K219 Gastro-esophageal reflux disease without esophagitis: Secondary | ICD-10-CM

## 2021-01-04 DIAGNOSIS — N528 Other male erectile dysfunction: Secondary | ICD-10-CM

## 2021-01-04 LAB — POCT GLYCOSYLATED HEMOGLOBIN (HGB A1C): Hemoglobin A1C: 5.5 % (ref 4.0–5.6)

## 2021-01-04 MED ORDER — SILDENAFIL CITRATE 100 MG PO TABS: ORAL_TABLET | ORAL | 1 refills | Status: DC

## 2021-01-04 MED ORDER — AMLODIPINE BESYLATE 5 MG PO TABS: 5.0000 mg | ORAL_TABLET | Freq: Every day | ORAL | 1 refills | Status: DC

## 2021-01-04 MED ORDER — OMEPRAZOLE 20 MG PO CPDR: 20.0000 mg | DELAYED_RELEASE_CAPSULE | Freq: Every day | ORAL | 1 refills | Status: DC

## 2021-01-04 MED ORDER — CETIRIZINE HCL 10 MG PO TABS: 10.0000 mg | ORAL_TABLET | Freq: Every day | ORAL | 1 refills | Status: DC

## 2021-01-04 NOTE — Progress Notes (Signed)
[facility] [place], [place] [address]  Internal MEDICINE  Office Visit Note  Patient Name: Timothy Moore  [id number]  [id number]  Date of Service: 01/04/2021  Chief Complaint  Patient presents with   Follow-up    2 month on daytime sleepiness    HPI Timothy Moore presents for a follow up visit for excessive daytime sleepiness. A sleep study was ordered but the patient states that it is too expensive even after insurance coverage and he cannot afford the test.  -He reports having a sweet tooth and his fasting glucose level is elevated.  Sweet tooth, elevated fasting glucose. A1C is 5.5  today. Followed by GI for H. Pylori infection.     Current Medication: Outpatient Encounter Medications as of 01/04/2021  Medication Sig   Multiple Vitamin (MULTIVITAMIN) tablet Take 1 tablet by mouth daily.   [DISCONTINUED] amLODipine (NORVASC) 5 MG tablet Take 1 tablet (5 mg total) by mouth daily.   [DISCONTINUED] cetirizine (ZYRTEC ALLERGY) 10 MG tablet Take 1 tablet (10 mg total) by mouth daily.   [DISCONTINUED] omeprazole (PRILOSEC) 20 MG capsule Take 1 capsule (20 mg total) by mouth daily.   [DISCONTINUED] sildenafil (VIAGRA) 100 MG tablet TAKE 0.5-1 TABLETS BY MOUTH DAILY AS NEEDED FOR ERECTILE DYSFUNCTION.   amLODipine (NORVASC) 5 MG tablet Take 1 tablet (5 mg total) by mouth daily.   cetirizine (ZYRTEC ALLERGY) 10 MG tablet Take 1 tablet (10 mg total) by mouth daily.   omeprazole (PRILOSEC) 20 MG capsule Take 1 capsule (20 mg total) by mouth daily.   sildenafil (VIAGRA) 100 MG tablet TAKE 0.5-1 TABLETS BY MOUTH DAILY AS NEEDED FOR ERECTILE DYSFUNCTION.   No facility-administered encounter medications on file as of 01/04/2021.    Surgical History: Past Surgical History:  Procedure Laterality Date   COLONOSCOPY WITH PROPOFOL N/A 05/29/2020   Procedure: COLONOSCOPY WITH PROPOFOL;  Surgeon: [name], MD;  Location: [facility];  Service: Endoscopy;   Laterality: N/A;   ESOPHAGOGASTRODUODENOSCOPY (EGD) WITH PROPOFOL N/A 05/29/2020   Procedure: ESOPHAGOGASTRODUODENOSCOPY (EGD) WITH PROPOFOL;  Surgeon: [name], MD;  Location: [facility];  Service: Endoscopy;  Laterality: N/A;   HAND SURGERY Left     Medical History: Past Medical History:  Diagnosis Date   GERD (gastroesophageal reflux disease)    Hypertension     Family History: Family History  Problem Relation Age of Onset   Kidney Stones Mother    Cancer Father    Lupus Sister    Cancer Maternal Grandmother    Cancer Paternal Grandmother     Social History   Socioeconomic History   Marital status: Married    Spouse name: Not on file   Number of children: Not on file   Years of education: Not on file   Highest education level: Not on file  Occupational History   Not on file  Tobacco Use   Smoking status: Former    Types: Cigarettes   Smokeless tobacco: Never   Tobacco comments:    quit 11 years ago   Substance and Sexual Activity   Alcohol use: Yes    Comment: 2 shots a month   Drug use: Never    Comment: marijuana 20 years ago   Sexual activity: Yes    Birth control/protection: Condom  Other Topics Concern   Not on file  Social History Narrative   Not on file   Social Determinants of Health   Financial Resource Strain: Not on file  Food Insecurity: Not  on file  Transportation Needs: Not on file  Physical Activity: Not on file  Stress: Not on file  Social Connections: Not on file  Intimate Partner Violence: Not on file      Review of Systems  Constitutional:  Negative for chills, fatigue and unexpected weight change.  HENT:  Negative for congestion, rhinorrhea, sneezing and sore throat.   Eyes:  Negative for redness.  Respiratory:  Negative for cough, chest tightness and shortness of breath.   Cardiovascular:  Negative for chest pain and palpitations.  Gastrointestinal:  Negative for abdominal pain, constipation, diarrhea, nausea  and vomiting.  Genitourinary:  Negative for dysuria and frequency.  Musculoskeletal:  Negative for arthralgias, back pain, joint swelling and neck pain.  Skin:  Negative for rash.  Neurological: Negative.  Negative for tremors and numbness.  Hematological:  Negative for adenopathy. Does not bruise/bleed easily.  Psychiatric/Behavioral:  Negative for behavioral problems (Depression), sleep disturbance and suicidal ideas. The patient is not nervous/anxious.    Vital Signs: BP 140/90   Pulse 84   Temp 98.1 F (36.7 C)   Resp 16   Ht 5\' 5"  (1.651 m)   Wt 173 lb (78.5 kg)   SpO2 97%   BMI 28.79 kg/m    Physical Exam Vitals reviewed.  Constitutional:      General: He is not in acute distress.    Appearance: Normal appearance. He is normal weight. He is not ill-appearing.  HENT:     Head: Normocephalic and atraumatic.  Eyes:     Extraocular Movements: Extraocular movements intact.     Pupils: Pupils are equal, round, and reactive to light.  Cardiovascular:     Rate and Rhythm: Normal rate and regular rhythm.  Pulmonary:     Effort: Pulmonary effort is normal. No respiratory distress.  Neurological:     Mental Status: He is alert and oriented to person, place, and time.     Cranial Nerves: No cranial nerve deficit.     Coordination: Coordination normal.     Gait: Gait normal.  Psychiatric:        Mood and Affect: Mood normal.        Behavior: Behavior normal.       Assessment/Plan: 1. Essential hypertension Stable, refill ordered - amLODipine (NORVASC) 5 MG tablet; Take 1 tablet (5 mg total) by mouth daily.  Dispense: 90 tablet; Refill: 1  2. Excessive daytime sleepiness Sleep study is too expensive, will discuss with Lorriane Shire to find out if it can be more affordable or if he can do in home study instead.  3. Impaired fasting glucose A1C is normal. Will continue to monitor annually - POCT glycosylated hemoglobin (Hb A1C)  4. Gastroesophageal reflux disease without  esophagitis stable  5. Male erectile dysfunction, unspecified Refill ordered - sildenafil (VIAGRA) 100 MG tablet; TAKE 0.5-1 TABLETS BY MOUTH DAILY AS NEEDED FOR ERECTILE DYSFUNCTION.  Dispense: 15 tablet; Refill: 1    General Counseling: taysean wager understanding of the findings of todays visit and agrees with plan of treatment. I have discussed any further diagnostic evaluation that may be needed or ordered today. We also reviewed his medications today. he has been encouraged to call the office with any questions or concerns that should arise related to todays visit.    Orders Placed This Encounter  Procedures   POCT glycosylated hemoglobin (Hb A1C)    Meds ordered this encounter  Medications   amLODipine (NORVASC) 5 MG tablet    Sig: Take 1 tablet (  5 mg total) by mouth daily.    Dispense:  90 tablet    Refill:  1   omeprazole (PRILOSEC) 20 MG capsule    Sig: Take 1 capsule (20 mg total) by mouth daily.    Dispense:  90 capsule    Refill:  1   cetirizine (ZYRTEC ALLERGY) 10 MG tablet    Sig: Take 1 tablet (10 mg total) by mouth daily.    Dispense:  90 tablet    Refill:  1   sildenafil (VIAGRA) 100 MG tablet    Sig: TAKE 0.5-1 TABLETS BY MOUTH DAILY AS NEEDED FOR ERECTILE DYSFUNCTION.    Dispense:  15 tablet    Refill:  1    DX Code Needed  .    Return in about 3 months (around 04/06/2021) for F/U, Breckon Reeves PCP.   Total time spent:30 Minutes Time spent includes review of chart, medications, test results, and follow up plan with the patient.   [place] Controlled Substance Database was reviewed by me.  This patient was seen by Jonetta Osgood, FNP-C in collaboration with Dr. Clayborn Bigness as a part of collaborative care agreement.   Zuleyma Scharf R. Valetta Fuller, MSN, FNP-C Internal medicine

## 2021-01-04 NOTE — Telephone Encounter (Signed)
He wanted to know what time his appointment was today.

## 2021-01-21 ENCOUNTER — Encounter: Payer: Self-pay | Admitting: Nurse Practitioner

## 2021-01-28 ENCOUNTER — Encounter: Payer: Self-pay | Admitting: Nurse Practitioner

## 2021-01-29 ENCOUNTER — Other Ambulatory Visit: Payer: Self-pay | Admitting: Nurse Practitioner

## 2021-01-30 LAB — CBC WITH DIFFERENTIAL/PLATELET
Basophils Absolute: 0.1 10*3/uL (ref 0.0–0.2)
Basos: 1 %
EOS (ABSOLUTE): 0.1 10*3/uL (ref 0.0–0.4)
Eos: 2 %
Hematocrit: 42.8 % (ref 37.5–51.0)
Hemoglobin: 14.6 g/dL (ref 13.0–17.7)
Immature Grans (Abs): 0 10*3/uL (ref 0.0–0.1)
Immature Granulocytes: 0 %
Lymphocytes Absolute: 1.9 10*3/uL (ref 0.7–3.1)
Lymphs: 35 %
MCH: 28.9 pg (ref 26.6–33.0)
MCHC: 34.1 g/dL (ref 31.5–35.7)
MCV: 85 fL (ref 79–97)
Monocytes Absolute: 0.6 10*3/uL (ref 0.1–0.9)
Monocytes: 11 %
Neutrophils Absolute: 2.8 10*3/uL (ref 1.4–7.0)
Neutrophils: 51 %
Platelets: 272 10*3/uL (ref 150–450)
RBC: 5.06 x10E6/uL (ref 4.14–5.80)
RDW: 13.2 % (ref 11.6–15.4)
WBC: 5.5 10*3/uL (ref 3.4–10.8)

## 2021-01-30 LAB — COMPREHENSIVE METABOLIC PANEL
ALT: 23 IU/L (ref 0–44)
AST: 34 IU/L (ref 0–40)
Albumin/Globulin Ratio: 2 (ref 1.2–2.2)
Albumin: 5 g/dL — ABNORMAL HIGH (ref 3.8–4.9)
Alkaline Phosphatase: 116 IU/L (ref 44–121)
BUN/Creatinine Ratio: 9 (ref 9–20)
BUN: 11 mg/dL (ref 6–24)
Bilirubin Total: 0.4 mg/dL (ref 0.0–1.2)
CO2: 23 mmol/L (ref 20–29)
Calcium: 9.8 mg/dL (ref 8.7–10.2)
Chloride: 100 mmol/L (ref 96–106)
Creatinine, Ser: 1.28 mg/dL — ABNORMAL HIGH (ref 0.76–1.27)
Globulin, Total: 2.5 g/dL (ref 1.5–4.5)
Glucose: 92 mg/dL (ref 70–99)
Potassium: 4.6 mmol/L (ref 3.5–5.2)
Sodium: 139 mmol/L (ref 134–144)
Total Protein: 7.5 g/dL (ref 6.0–8.5)
eGFR: 67 mL/min/{1.73_m2} (ref >59)

## 2021-01-30 LAB — LIPID PANEL WITH LDL/HDL RATIO
Cholesterol, Total: 223 mg/dL — ABNORMAL HIGH (ref 100–199)
HDL: 54 mg/dL (ref >39)
LDL Chol Calc (NIH): 141 mg/dL — ABNORMAL HIGH (ref 0–99)
LDL/HDL Ratio: 2.6 ratio (ref 0.0–3.6)
Triglycerides: 156 mg/dL — ABNORMAL HIGH (ref 0–149)
VLDL Cholesterol Cal: 28 mg/dL (ref 5–40)

## 2021-01-30 LAB — TSH: TSH: 1.3 u[IU]/mL (ref 0.450–4.500)

## 2021-01-30 LAB — VITAMIN D 25 HYDROXY (VIT D DEFICIENCY, FRACTURES): Vit D, 25-Hydroxy: 24.8 ng/mL — ABNORMAL LOW (ref 30.0–100.0)

## 2021-01-30 LAB — T4, FREE: Free T4: 1.27 ng/dL (ref 0.82–1.77)

## 2021-01-30 LAB — PSA, TOTAL AND FREE
PSA, Free Pct: 26.3 %
PSA, Free: 0.42 ng/mL
Prostate Specific Ag, Serum: 1.6 ng/mL (ref 0.0–4.0)

## 2021-02-08 ENCOUNTER — Encounter: Payer: Self-pay | Admitting: Nurse Practitioner

## 2021-02-08 ENCOUNTER — Telehealth: Payer: Self-pay

## 2021-02-08 NOTE — Telephone Encounter (Signed)
Called patient to move appointment up to 02/09/21. He asked to leave appointment on 02/11/21 since he is starting new job-Timothy Moore

## 2021-02-11 ENCOUNTER — Telehealth: Payer: PRIVATE HEALTH INSURANCE | Admitting: Nurse Practitioner

## 2021-02-11 ENCOUNTER — Other Ambulatory Visit: Payer: Self-pay

## 2021-02-11 ENCOUNTER — Encounter: Payer: Self-pay | Admitting: Nurse Practitioner

## 2021-02-11 VITALS — Ht 65.0 in | Wt 173.0 lb

## 2021-02-11 DIAGNOSIS — R062 Wheezing: Secondary | ICD-10-CM | POA: Diagnosis not present

## 2021-02-11 DIAGNOSIS — J018 Other acute sinusitis: Secondary | ICD-10-CM | POA: Diagnosis not present

## 2021-02-11 DIAGNOSIS — R051 Acute cough: Secondary | ICD-10-CM | POA: Diagnosis not present

## 2021-02-11 MED ORDER — AMOXICILLIN-POT CLAVULANATE 875-125 MG PO TABS: 1.0000 | ORAL_TABLET | Freq: Two times a day (BID) | ORAL | 0 refills | Status: DC

## 2021-02-11 MED ORDER — PREDNISONE 10 MG PO TABS: ORAL_TABLET | ORAL | 0 refills | Status: DC

## 2021-02-11 MED ORDER — BENZONATATE 100 MG PO CAPS: 100.0000 mg | ORAL_CAPSULE | Freq: Two times a day (BID) | ORAL | 0 refills | Status: DC | PRN

## 2021-02-11 NOTE — Progress Notes (Signed)
[facility] [place], [place] [address]  Internal MEDICINE  Telephone Visit  Patient Name: Timothy Moore  [id number]  [id number]  Date of Service: 02/11/2021  I connected with the patient at 4:00 PM by telephone and verified the patients identity using two identifiers.   I discussed the limitations, risks, security and privacy concerns of performing an evaluation and management service by telephone and the availability of in person appointments. I also discussed with the patient that there may be a patient responsible charge related to the service.  The patient expressed understanding and agrees to proceed.    Chief Complaint  Patient presents with   Telephone Assessment    [phone]   Telephone Screen    Covid test negative since Saturday    Cough    Wheezing    Headache     Timothy Moore presents for a telehealth virtual visit for cough with wheezing and headache. He thought he had the flu over thanksgiving last week. It has now been about 1 week since his symptoms started so he is outside of the window to receive tamiflu.   Current Medication: Outpatient Encounter Medications as of 02/11/2021  Medication Sig   amLODipine (NORVASC) 5 MG tablet Take 1 tablet (5 mg total) by mouth daily.   amoxicillin-clavulanate (AUGMENTIN) 875-125 MG tablet Take 1 tablet by mouth 2 (two) times daily.   benzonatate (TESSALON) 100 MG capsule Take 1 capsule (100 mg total) by mouth 2 (two) times daily as needed for cough.   cetirizine (ZYRTEC ALLERGY) 10 MG tablet Take 1 tablet (10 mg total) by mouth daily.   Multiple Vitamin (MULTIVITAMIN) tablet Take 1 tablet by mouth daily.   omeprazole (PRILOSEC) 20 MG capsule Take 1 capsule (20 mg total) by mouth daily.   predniSONE (DELTASONE) 10 MG tablet Take one tab 3 x day for 3 days, then take one tab 2 x a day for 3 days and then take one tab a day for 3 days for URI   sildenafil (VIAGRA) 100 MG tablet TAKE 0.5-1 TABLETS BY MOUTH  DAILY AS NEEDED FOR ERECTILE DYSFUNCTION.   No facility-administered encounter medications on file as of 02/11/2021.    Surgical History: Past Surgical History:  Procedure Laterality Date   COLONOSCOPY WITH PROPOFOL N/A 05/29/2020   Procedure: COLONOSCOPY WITH PROPOFOL;  Surgeon: [name], MD;  Location: [facility];  Service: Endoscopy;  Laterality: N/A;   ESOPHAGOGASTRODUODENOSCOPY (EGD) WITH PROPOFOL N/A 05/29/2020   Procedure: ESOPHAGOGASTRODUODENOSCOPY (EGD) WITH PROPOFOL;  Surgeon: [name], MD;  Location: [facility];  Service: Endoscopy;  Laterality: N/A;   HAND SURGERY Left     Medical History: Past Medical History:  Diagnosis Date   GERD (gastroesophageal reflux disease)    Hypertension     Family History: Family History  Problem Relation Age of Onset   Kidney Stones Mother    Cancer Father    Lupus Sister    Cancer Maternal Grandmother    Cancer Paternal Grandmother     Social History   Socioeconomic History   Marital status: Married    Spouse name: Not on file   Number of children: Not on file   Years of education: Not on file   Highest education level: Not on file  Occupational History   Not on file  Tobacco Use   Smoking status: Former    Types: Cigarettes   Smokeless tobacco: Never   Tobacco comments:    quit 11 years ago  Substance and Sexual Activity   Alcohol use: Yes    Comment: 2 shots a month   Drug use: Never    Comment: marijuana 20 years ago   Sexual activity: Yes    Birth control/protection: Condom  Other Topics Concern   Not on file  Social History Narrative   Not on file   Social Determinants of Health   Financial Resource Strain: Not on file  Food Insecurity: Not on file  Transportation Needs: Not on file  Physical Activity: Not on file  Stress: Not on file  Social Connections: Not on file  Intimate Partner Violence: Not on file      Review of Systems  Constitutional:  Positive for  chills, diaphoresis, fatigue and fever.  HENT:  Positive for congestion, postnasal drip, rhinorrhea, sinus pressure and sinus pain. Negative for ear pain, sneezing, sore throat and trouble swallowing.   Respiratory:  Positive for cough, shortness of breath and wheezing. Negative for chest tightness.   Cardiovascular: Negative.  Negative for chest pain and palpitations.  Gastrointestinal:  Negative for abdominal pain, constipation, diarrhea, nausea and vomiting.  Musculoskeletal:  Positive for myalgias.  Skin:  Negative for rash.  Neurological:  Positive for headaches. Negative for dizziness and light-headedness.   Vital Signs: Ht 5\' 5"  (1.651 m)   Wt 173 lb (78.5 kg)   BMI 28.79 kg/m    Observation/Objective: He is alert and oriented and engages in conversation appropriately.  He does not appear to be in any acute distress over video call.    Assessment/Plan: 1. Acute non-recurrent sinusitis of other sinus Empiric antibiotic treatment prescribed.   - amoxicillin-clavulanate (AUGMENTIN) 875-125 MG tablet; Take 1 tablet by mouth 2 (two) times daily.  Dispense: 20 tablet; Refill: 0  2. Acute cough Medication prescribed for symptomatic relief of cough. - benzonatate (TESSALON) 100 MG capsule; Take 1 capsule (100 mg total) by mouth 2 (two) times daily as needed for cough.  Dispense: 30 capsule; Refill: 0  3. Wheezing Prednisone taper prescribed to decrease inflammation and wheezing. - predniSONE (DELTASONE) 10 MG tablet; Take one tab 3 x day for 3 days, then take one tab 2 x a day for 3 days and then take one tab a day for 3 days for URI  Dispense: 18 tablet; Refill: 0   General Counseling: Timothy Moore understanding of the findings of today's phone visit and agrees with plan of treatment. I have discussed any further diagnostic evaluation that may be needed or ordered today. We also reviewed his medications today. he has been encouraged to call the office with any questions or  concerns that should arise related to todays visit.  Return if symptoms worsen or fail to improve.   No orders of the defined types were placed in this encounter.   Meds ordered this encounter  Medications   benzonatate (TESSALON) 100 MG capsule    Sig: Take 1 capsule (100 mg total) by mouth 2 (two) times daily as needed for cough.    Dispense:  30 capsule    Refill:  0   amoxicillin-clavulanate (AUGMENTIN) 875-125 MG tablet    Sig: Take 1 tablet by mouth 2 (two) times daily.    Dispense:  20 tablet    Refill:  0   predniSONE (DELTASONE) 10 MG tablet    Sig: Take one tab 3 x day for 3 days, then take one tab 2 x a day for 3 days and then take one tab a day for 3 days  for URI    Dispense:  18 tablet    Refill:  0    Time spent:10 Minutes Time spent with patient included reviewing progress notes, labs, imaging studies, and discussing plan for follow up.  [place] Controlled Substance Database was reviewed by me for overdose risk score (ORS) if appropriate.  This patient was seen by Jonetta Osgood, FNP-C in collaboration with Dr. Clayborn Bigness as a part of collaborative care agreement.  Ingeborg Fite R. Valetta Fuller, MSN, FNP-C Internal medicine

## 2021-03-05 ENCOUNTER — Encounter: Payer: Self-pay | Admitting: Nurse Practitioner

## 2021-03-06 ENCOUNTER — Encounter: Payer: Self-pay | Admitting: Nurse Practitioner

## 2021-03-06 ENCOUNTER — Other Ambulatory Visit: Payer: Self-pay | Admitting: Nurse Practitioner

## 2021-03-06 NOTE — Telephone Encounter (Signed)
This is an antibiotic, if patient needs another course of antibiotic, he needs to call the office and make an appointment

## 2021-03-09 ENCOUNTER — Encounter: Payer: Self-pay | Admitting: Nurse Practitioner

## 2021-03-11 ENCOUNTER — Encounter: Payer: Self-pay | Admitting: Gastroenterology

## 2021-03-12 ENCOUNTER — Ambulatory Visit: Payer: PRIVATE HEALTH INSURANCE | Admitting: Certified Registered Nurse Anesthetist

## 2021-03-12 ENCOUNTER — Ambulatory Visit
Admission: RE | Admit: 2021-03-12 | Discharge: 2021-03-12 | Disposition: A | Discharge status: Home / Self Care | Payer: PRIVATE HEALTH INSURANCE | Attending: Gastroenterology | Admitting: Gastroenterology

## 2021-03-12 ENCOUNTER — Encounter
Admission: RE | Disposition: A | Discharge status: Home / Self Care | Payer: Self-pay | Source: Home / Self Care | Attending: Gastroenterology

## 2021-03-12 DIAGNOSIS — K31A0 Gastric intestinal metaplasia, unspecified: Secondary | ICD-10-CM | POA: Diagnosis not present

## 2021-03-12 DIAGNOSIS — K253 Acute gastric ulcer without hemorrhage or perforation: Secondary | ICD-10-CM

## 2021-03-12 DIAGNOSIS — G4730 Sleep apnea, unspecified: Secondary | ICD-10-CM | POA: Insufficient documentation

## 2021-03-12 DIAGNOSIS — Z87891 Personal history of nicotine dependence: Secondary | ICD-10-CM | POA: Insufficient documentation

## 2021-03-12 DIAGNOSIS — I10 Essential (primary) hypertension: Secondary | ICD-10-CM | POA: Diagnosis not present

## 2021-03-12 DIAGNOSIS — K2950 Unspecified chronic gastritis without bleeding: Secondary | ICD-10-CM | POA: Insufficient documentation

## 2021-03-12 DIAGNOSIS — K2289 Other specified disease of esophagus: Secondary | ICD-10-CM | POA: Insufficient documentation

## 2021-03-12 DIAGNOSIS — K219 Gastro-esophageal reflux disease without esophagitis: Secondary | ICD-10-CM | POA: Insufficient documentation

## 2021-03-12 DIAGNOSIS — Z8619 Personal history of other infectious and parasitic diseases: Secondary | ICD-10-CM | POA: Diagnosis not present

## 2021-03-12 DIAGNOSIS — K3189 Other diseases of stomach and duodenum: Secondary | ICD-10-CM | POA: Insufficient documentation

## 2021-03-12 DIAGNOSIS — K259 Gastric ulcer, unspecified as acute or chronic, without hemorrhage or perforation: Secondary | ICD-10-CM | POA: Insufficient documentation

## 2021-03-12 DIAGNOSIS — Z8719 Personal history of other diseases of the digestive system: Secondary | ICD-10-CM | POA: Diagnosis present

## 2021-03-12 HISTORY — PX: ESOPHAGOGASTRODUODENOSCOPY (EGD) WITH PROPOFOL: SHX5813

## 2021-03-12 SURGERY — ESOPHAGOGASTRODUODENOSCOPY (EGD) WITH PROPOFOL
Anesthesia: General

## 2021-03-12 MED ORDER — PROPOFOL 500 MG/50ML IV EMUL
INTRAVENOUS | Status: AC
  Filled 2021-03-12: qty 50

## 2021-03-12 MED ORDER — OMEPRAZOLE 20 MG PO CPDR: 20.0000 mg | DELAYED_RELEASE_CAPSULE | Freq: Two times a day (BID) | ORAL | 0 refills | Status: DC

## 2021-03-12 MED ORDER — GLYCOPYRROLATE 0.2 MG/ML IJ SOLN
INTRAMUSCULAR | Status: DC | PRN
  Administered 2021-03-12: .2 mg via INTRAVENOUS

## 2021-03-12 MED ORDER — LIDOCAINE HCL (PF) 2 % IJ SOLN
INTRAMUSCULAR | Status: AC
  Filled 2021-03-12: qty 5

## 2021-03-12 MED ORDER — SODIUM CHLORIDE 0.9 % IV SOLN
INTRAVENOUS | Status: DC
  Administered 2021-03-12: 07:00:00 20 mL/h via INTRAVENOUS

## 2021-03-12 MED ORDER — PROPOFOL 10 MG/ML IV BOLUS
INTRAVENOUS | Status: DC | PRN
  Administered 2021-03-12: 70 mg via INTRAVENOUS

## 2021-03-12 MED ORDER — PROPOFOL 500 MG/50ML IV EMUL
INTRAVENOUS | Status: DC | PRN
  Administered 2021-03-12: 150 ug/kg/min via INTRAVENOUS

## 2021-03-12 MED ORDER — LIDOCAINE HCL (CARDIAC) PF 100 MG/5ML IV SOSY
PREFILLED_SYRINGE | INTRAVENOUS | Status: DC | PRN
  Administered 2021-03-12: 50 mg via INTRAVENOUS

## 2021-03-12 NOTE — Op Note (Addendum)
[facility] Gastroenterology Patient Name: Timothy Moore Procedure Date: 03/12/2021 6:56 AM MRN: [id number] Account #: [id number] Date of Birth: October 11, 1965 Admit Type: Outpatient Age: 55 Room: [facility] ENDO ROOM 1 Gender: Male Note Status: Finalized Instrument Name: Upper Endoscope [id number] Procedure:             Upper GI endoscopy Indications:           Follow-up of intestinal metaplasia Providers:             Merna Baldi B. Bonna Gains MD, MD Referring MD:          Jonetta Osgood (Referring MD) Medicines:             Monitored Anesthesia Care Complications:         No immediate complications. Procedure:             Pre-Anesthesia Assessment:                        - The risks and benefits of the procedure and the                         sedation options and risks were discussed with the                         patient. All questions were answered and informed                         consent was obtained.                        - Patient identification and proposed procedure were                         verified prior to the procedure.                        - ASA Grade Assessment: II - A patient with mild                         systemic disease.                        After obtaining informed consent, the endoscope was                         passed under direct vision. Throughout the procedure,                         the patient's blood pressure, pulse, and oxygen                         saturations were monitored continuously. The Endoscope                         was introduced through the mouth, and advanced to the                         second part of duodenum. The upper GI endoscopy was  accomplished with ease. The patient tolerated the                         procedure well. Findings:      The Z-line was irregular. Biopsies were performed on recent procedure in       March 2022 and therefore repeat biopsies are not indicated at this  time.      The exam of the esophagus was otherwise normal.      A single 4 mm erosion with no bleeding and no stigmata of recent       bleeding was found in the gastric antrum. Biopsies were taken with a       cold forceps for histology. Biopsies were obtained on the greater       curvature of the gastric body, on the lesser curvature of the gastric       body, at the incisura, on the greater curvature of the gastric antrum       and on the lesser curvature of the gastric antrum with cold forceps for       histology.      The exam of the stomach was otherwise normal.      The examined duodenum was normal. Impression:            - Z-line irregular.                        - Erosive gastropathy with no bleeding and no stigmata                         of recent bleeding. Biopsied.                        - Normal examined duodenum.                        - Biopsies were obtained on the greater curvature of                         the gastric body, on the lesser curvature of the                         gastric body, at the incisura, on the greater                         curvature of the gastric antrum and on the lesser                         curvature of the gastric antrum. Recommendation:        - Follow an antireflux regimen.                        - Continue present medications.                        - Await pathology results.                        - Avoid NSAID use (like Ibuprofen, Aleeve, Motrin,  Advil, Meloxicam, Goodie Powder, BC powder etc.)                         except for Aspirin if it is medically recommended by                         PCP or cardiology                        - Take prescribed proton pump inhibitor or H2 blocker                         (antacid) medications 30 - 60 minutes before meals.                        - Return to GI clinic in 3-4 months or as previously                         scheduled.                        - The findings  and recommendations were discussed with                         the patient.                        - The findings and recommendations were discussed with                         the patient's family.                        - Return to primary care physician as previously                         scheduled.                        - (Risks of PPI use were discussed with patient                         including bone loss, C. Diff diarrhea, pneumonia,                         infections, CKD, electrolyte abnormalities. Pt.                         Verbalizes understanding and chooses to continue the                         medication.) Procedure Code(s):     --- Professional ---                        [phone], Esophagogastroduodenoscopy, flexible,                         transoral; with biopsy, single or multiple Diagnosis Code(s):     --- Professional ---  K22.8, Other specified diseases of esophagus                        K31.89, Other diseases of stomach and duodenum CPT copyright 2019 [employer] Association. All rights reserved. The codes documented in this report are preliminary and upon coder review may  be revised to meet current compliance requirements.  [name], MD Margretta Sidle B. Bonna Gains MD, MD 03/12/2021 8:10:20 AM This report has been signed electronically. Number of Addenda: 0 Note Initiated On: 03/12/2021 6:56 AM Estimated Blood Loss:  Estimated blood loss: none.      [facility]

## 2021-03-12 NOTE — Anesthesia Postprocedure Evaluation (Signed)
Anesthesia Post Note  Patient: Timothy Moore  Procedure(s) Performed: ESOPHAGOGASTRODUODENOSCOPY (EGD) WITH PROPOFOL  Patient location during evaluation: Endoscopy Anesthesia Type: General Level of consciousness: awake and alert Pain management: pain level controlled Vital Signs Assessment: post-procedure vital signs reviewed and stable Respiratory status: spontaneous breathing, nonlabored ventilation and respiratory function stable Cardiovascular status: blood pressure returned to baseline and stable Postop Assessment: no apparent nausea or vomiting Anesthetic complications: no   No notable events documented.   Last Vitals:  Vitals:   03/12/21 0818 03/12/21 0830  BP:  (!) 127/97  Pulse:    Resp:    Temp: (!) 36 C   SpO2:      Last Pain:  Vitals:   03/12/21 0830  TempSrc:   PainSc: 0-No pain                 [place] Ouch

## 2021-03-12 NOTE — Transfer of Care (Signed)
Immediate Anesthesia Transfer of Care Note  Patient: Timothy Moore  Procedure(s) Performed: ESOPHAGOGASTRODUODENOSCOPY (EGD) WITH PROPOFOL  Patient Location: PACU  Anesthesia Type:General  Level of Consciousness: drowsy  Airway & Oxygen Therapy: Patient Spontanous Breathing  Post-op Assessment: Report given to RN and Post -op Vital signs reviewed and stable  Post vital signs: Reviewed and stable  Last Vitals:  Vitals Value Taken Time  BP 111/81 03/12/21 0800  Temp    Pulse 98 03/12/21 0800  Resp 15 03/12/21 0800  SpO2 95 % 03/12/21 0800  Vitals shown include unvalidated device data.  Last Pain:  Vitals:   03/12/21 0705  TempSrc: Temporal  PainSc: 0-No pain         Complications: No notable events documented.

## 2021-03-12 NOTE — Anesthesia Preprocedure Evaluation (Addendum)
Anesthesia Evaluation  Patient identified by MRN, date of birth, ID band Patient awake    Reviewed: Allergy & Precautions, NPO status , Patient's Chart, lab work & pertinent test results  Airway Mallampati: II  TM Distance: >3 FB Neck ROM: Full    Dental  (+) Lower Dentures, Upper Dentures   Pulmonary sleep apnea (expected) , former smoker,    Pulmonary exam normal breath sounds clear to auscultation       Cardiovascular Exercise Tolerance: Good hypertension, Pt. on medications Normal cardiovascular exam Rhythm:Regular Rate:Normal     Neuro/Psych negative neurological ROS  negative psych ROS   GI/Hepatic Neg liver ROS, GERD  ,  Endo/Other  negative endocrine ROS  Renal/GU negative Renal ROS  negative genitourinary   Musculoskeletal negative musculoskeletal ROS (+)   Abdominal   Peds negative pediatric ROS (+)  Hematology negative hematology ROS (+)   Anesthesia Other Findings History of Hpylori, gastric erythema  Reproductive/Obstetrics negative OB ROS                            Anesthesia Physical Anesthesia Plan  ASA: 2  Anesthesia Plan: General   Post-op Pain Management:    Induction: Intravenous  PONV Risk Score and Plan:   Airway Management Planned: Natural Airway and Nasal Cannula  Additional Equipment:   Intra-op Plan:   Post-operative Plan:   Informed Consent: I have reviewed the patients History and Physical, chart, labs and discussed the procedure including the risks, benefits and alternatives for the proposed anesthesia with the patient or authorized representative who has indicated his/her understanding and acceptance.     Dental Advisory Given  Plan Discussed with: Anesthesiologist, CRNA and Surgeon  Anesthesia Plan Comments:        Anesthesia Quick Evaluation

## 2021-03-12 NOTE — H&P (Signed)
Timothy Antigua, MD [address], [place], [place], [place], [address] [id number], [place], [place], [place], [address] Phone: [phone]  Fax: [phone]  Primary Care Physician:  Jonetta Osgood, NP   Pre-Procedure History & Physical: HPI:  Timothy Moore is a 55 y.o. male is here for an EGD.   Past Medical History:  Diagnosis Date   GERD (gastroesophageal reflux disease)    Hypertension     Past Surgical History:  Procedure Laterality Date   COLONOSCOPY WITH PROPOFOL N/A 05/29/2020   Procedure: COLONOSCOPY WITH PROPOFOL;  Surgeon: [name], MD;  Location: [facility] ENDOSCOPY;  Service: Endoscopy;  Laterality: N/A;   ESOPHAGOGASTRODUODENOSCOPY (EGD) WITH PROPOFOL N/A 05/29/2020   Procedure: ESOPHAGOGASTRODUODENOSCOPY (EGD) WITH PROPOFOL;  Surgeon: [name], MD;  Location: [facility] ENDOSCOPY;  Service: Endoscopy;  Laterality: N/A;   HAND SURGERY Left     Prior to Admission medications   Medication Sig Start Date End Date Taking? Authorizing Provider  amLODipine (NORVASC) 5 MG tablet Take 1 tablet (5 mg total) by mouth daily. 01/04/21  Yes Abernathy, Yetta Flock, NP  amoxicillin-clavulanate (AUGMENTIN) 875-125 MG tablet Take 1 tablet by mouth 2 (two) times daily. 02/11/21  Yes Abernathy, Yetta Flock, NP  benzonatate (TESSALON) 100 MG capsule Take 1 capsule (100 mg total) by mouth 2 (two) times daily as needed for cough. 02/11/21  Yes Abernathy, Yetta Flock, NP  cetirizine (ZYRTEC ALLERGY) 10 MG tablet Take 1 tablet (10 mg total) by mouth daily. 01/04/21  Yes Abernathy, Yetta Flock, NP  Multiple Vitamin (MULTIVITAMIN) tablet Take 1 tablet by mouth daily.   Yes [provider]  omeprazole (PRILOSEC) 20 MG capsule Take 1 capsule (20 mg total) by mouth daily. 01/04/21  Yes Abernathy, Yetta Flock, NP  predniSONE (DELTASONE) 10 MG tablet Take one tab 3 x day for 3 days, then take one tab 2 x a day for 3 days and then take one tab a day for 3 days for URI 02/11/21  Yes Abernathy,  Alyssa, NP  sildenafil (VIAGRA) 100 MG tablet TAKE 0.5-1 TABLETS BY MOUTH DAILY AS NEEDED FOR ERECTILE DYSFUNCTION. 01/04/21  Yes Jonetta Osgood, NP    Allergies as of 11/20/2020   (No Known Allergies)    Family History  Problem Relation Age of Onset   Kidney Stones Mother    Cancer Father    Lupus Sister    Cancer Maternal Grandmother    Cancer Paternal Grandmother     Social History   Socioeconomic History   Marital status: Married    Spouse name: Not on file   Number of children: Not on file   Years of education: Not on file   Highest education level: Not on file  Occupational History   Not on file  Tobacco Use   Smoking status: Former    Types: Cigarettes   Smokeless tobacco: Never   Tobacco comments:    quit 11 years ago   Vaping Use   Vaping Use: Never used  Substance and Sexual Activity   Alcohol use: Yes    Comment: 2 shots a month   Drug use: Never    Comment: marijuana 20 years ago   Sexual activity: Yes    Birth control/protection: Condom  Other Topics Concern   Not on file  Social History Narrative   Not on file   Social Determinants of Health   Financial Resource Strain: Not on file  Food Insecurity: Not on file  Transportation Needs: Not on file  Physical Activity: Not on file  Stress: Not on file  Social Connections: Not on file  Intimate Partner Violence: Not on file    Review of Systems: See HPI, otherwise negative ROS  Constitutional: General:   Alert,  Well-developed, well-nourished, pleasant and cooperative in NAD BP 125/85    Pulse 70    Temp (!) 96.8 F (36 C) (Temporal)    Resp 20    Ht 5\' 5"  (1.651 m)    Wt 77.6 kg    SpO2 98%    BMI 28.46 kg/m   Head: Normocephalic, atraumatic.   Eyes:  Sclera clear, no icterus.   Conjunctiva pink.   Mouth:  No deformity or lesions, oropharynx pink & moist.  Neck:  Supple, trachea midline  Respiratory: Normal respiratory effort  Gastrointestinal:  Soft, non-tender and  non-distended without masses, hepatosplenomegaly or hernias noted.  No guarding or rebound tenderness.     Cardiac: No clubbing or edema.  No cyanosis. Normal posterior tibial pedal pulses noted.  Lymphatic:  No significant cervical adenopathy.  Psych:  Alert and cooperative. Normal mood and affect.  Musculoskeletal:   Symmetrical without gross deformities. 5/5 Lower extremity strength bilaterally.  Skin: Warm. Intact without significant lesions or rashes. No jaundice.  Neurologic:  Face symmetrical, tongue midline, Normal sensation to touch;  grossly normal neurologically.  Psych:  Alert and oriented x3, Alert and cooperative. Normal mood and affect.  Impression/Plan: Timothy Moore is here for an EGD for follow up of gastric intestinal metaplasia  Risks, benefits, limitations, and alternatives regarding the procedure have been reviewed with the patient.  Questions have been answered.  All parties agreeable.   [name], MD  03/12/2021, 7:41 AM

## 2021-03-16 ENCOUNTER — Encounter: Payer: Self-pay | Admitting: Gastroenterology

## 2021-03-17 LAB — SURGICAL PATHOLOGY

## 2021-03-19 ENCOUNTER — Other Ambulatory Visit: Payer: Self-pay | Admitting: Gastroenterology

## 2021-03-19 DIAGNOSIS — A048 Other specified bacterial intestinal infections: Secondary | ICD-10-CM

## 2021-03-23 ENCOUNTER — Ambulatory Visit (INDEPENDENT_AMBULATORY_CARE_PROVIDER_SITE_OTHER): Payer: PRIVATE HEALTH INSURANCE | Admitting: Nurse Practitioner

## 2021-03-23 ENCOUNTER — Encounter: Payer: Self-pay | Admitting: Nurse Practitioner

## 2021-03-23 ENCOUNTER — Other Ambulatory Visit: Payer: Self-pay

## 2021-03-23 VITALS — BP 138/80 | HR 75 | Temp 98.5°F | Resp 16 | Ht 65.0 in | Wt 170.2 lb

## 2021-03-23 DIAGNOSIS — K219 Gastro-esophageal reflux disease without esophagitis: Secondary | ICD-10-CM

## 2021-03-23 DIAGNOSIS — K130 Diseases of lips: Secondary | ICD-10-CM | POA: Diagnosis not present

## 2021-03-23 DIAGNOSIS — E782 Mixed hyperlipidemia: Secondary | ICD-10-CM | POA: Diagnosis not present

## 2021-03-23 DIAGNOSIS — R252 Cramp and spasm: Secondary | ICD-10-CM

## 2021-03-23 DIAGNOSIS — Z0001 Encounter for general adult medical examination with abnormal findings: Secondary | ICD-10-CM | POA: Diagnosis not present

## 2021-03-23 DIAGNOSIS — I10 Essential (primary) hypertension: Secondary | ICD-10-CM | POA: Diagnosis not present

## 2021-03-23 DIAGNOSIS — N528 Other male erectile dysfunction: Secondary | ICD-10-CM

## 2021-03-23 DIAGNOSIS — E559 Vitamin D deficiency, unspecified: Secondary | ICD-10-CM

## 2021-03-23 DIAGNOSIS — R300 Dysuria: Secondary | ICD-10-CM

## 2021-03-23 MED ORDER — CETIRIZINE HCL 10 MG PO TABS: 10.0000 mg | ORAL_TABLET | Freq: Every day | ORAL | 1 refills | Status: DC

## 2021-03-23 MED ORDER — AMLODIPINE BESYLATE 5 MG PO TABS: 5.0000 mg | ORAL_TABLET | Freq: Every day | ORAL | 1 refills | Status: DC

## 2021-03-23 MED ORDER — SILDENAFIL CITRATE 100 MG PO TABS: ORAL_TABLET | ORAL | 1 refills | Status: DC

## 2021-03-23 MED ORDER — CLOTRIMAZOLE-BETAMETHASONE 1-0.05 % EX CREA: 1.0000 "application " | TOPICAL_CREAM | Freq: Two times a day (BID) | CUTANEOUS | 0 refills | Status: AC

## 2021-03-23 MED ORDER — IBUPROFEN 800 MG PO TABS: 800.0000 mg | ORAL_TABLET | Freq: Three times a day (TID) | ORAL | 0 refills | Status: DC | PRN

## 2021-03-23 NOTE — Progress Notes (Signed)
[facility] [place], [place] [address]  Internal MEDICINE  Office Visit Note  Patient Name: [facility]  [id number]  [id number]  Date of Service: 03/23/2021  Chief Complaint  Patient presents with   Annual Exam    Pt has spot on lip, has been there for years    Hypertension   Gastroesophageal Reflux    HPI Gurney presents for an annual well visit and physical exam.He is a well appearing 56 yo male with hypertension, hyperlipidemia and GERD. His blood pressure is well controlled with current medication. He is in need of refills. He had labs drawn in November 2022 and the labs were reviewed with him at today's visit.  His CBC, TSH, free T4 and PSA level were normal. His metabolic panel was grossly normal except for a slightly elevated creatinine level at 1.26 with a normal GFR. His vitamin D level is slightly low. His lipid panel is abnormal with a total cholesterol level of 223 and LDL of 141 which have both improved when compared to 1 year ago. His triglyceride level increased from 116 to 156 but his LDL/HDL ratio decreased from 2.9 to 2.6. His ASCVD risk is elevated at 11.6%.  He reports that he started going to the gym regularly in November. He reports that his diet is not good and he eats a lot of foods he probably shouldn't.   The 10-year ASCVD risk score (Arnett DK, et al., 2019) is: 11.6%   Values used to calculate the score:     Age: 89 years     Sex: Male     Is Non-Hispanic African American: Yes     Diabetic: No     Tobacco smoker: No     Systolic Blood Pressure: 322 mmHg     Is BP treated: Yes     HDL Cholesterol: 54 mg/dL     Total Cholesterol: 223 mg/dL      Current Medication: Outpatient Encounter Medications as of 03/23/2021  Medication Sig   clotrimazole-betamethasone (LOTRISONE) cream Apply 1 application topically 2 (two) times daily for 21 days. Then may use as needed if it recurs.   ibuprofen (ADVIL) 800 MG tablet Take 1 tablet  (800 mg total) by mouth every 8 (eight) hours as needed for mild pain or moderate pain.   Multiple Vitamin (MULTIVITAMIN) tablet Take 1 tablet by mouth daily.   omeprazole (PRILOSEC) 20 MG capsule Take 1 capsule (20 mg total) by mouth in the morning and at bedtime for 28 days.   [DISCONTINUED] amLODipine (NORVASC) 5 MG tablet Take 1 tablet (5 mg total) by mouth daily.   [DISCONTINUED] cetirizine (ZYRTEC ALLERGY) 10 MG tablet Take 1 tablet (10 mg total) by mouth daily.   [DISCONTINUED] sildenafil (VIAGRA) 100 MG tablet TAKE 0.5-1 TABLETS BY MOUTH DAILY AS NEEDED FOR ERECTILE DYSFUNCTION.   amLODipine (NORVASC) 5 MG tablet Take 1 tablet (5 mg total) by mouth daily.   cetirizine (ZYRTEC ALLERGY) 10 MG tablet Take 1 tablet (10 mg total) by mouth daily.   sildenafil (VIAGRA) 100 MG tablet TAKE 0.5-1 TABLETS BY MOUTH DAILY AS NEEDED FOR ERECTILE DYSFUNCTION.   [DISCONTINUED] amLODipine (NORVASC) 5 MG tablet Take 1 tablet (5 mg total) by mouth daily.   [DISCONTINUED] losartan (COZAAR) 50 MG tablet Take 1 tablet (50 mg total) by mouth daily.   [DISCONTINUED] omeprazole (PRILOSEC) 20 MG capsule Take 1 capsule (20 mg total) by mouth daily.   No facility-administered encounter medications on file as of 03/23/2021.  Surgical History: Past Surgical History:  Procedure Laterality Date   COLONOSCOPY WITH PROPOFOL N/A 05/29/2020   Procedure: COLONOSCOPY WITH PROPOFOL;  Surgeon: [name], MD;  Location: [facility] ENDOSCOPY;  Service: Endoscopy;  Laterality: N/A;   ESOPHAGOGASTRODUODENOSCOPY (EGD) WITH PROPOFOL N/A 05/29/2020   Procedure: ESOPHAGOGASTRODUODENOSCOPY (EGD) WITH PROPOFOL;  Surgeon: [name], MD;  Location: [facility] ENDOSCOPY;  Service: Endoscopy;  Laterality: N/A;   ESOPHAGOGASTRODUODENOSCOPY (EGD) WITH PROPOFOL N/A 03/12/2021   Procedure: ESOPHAGOGASTRODUODENOSCOPY (EGD) WITH PROPOFOL;  Surgeon: [name], MD;  Location: [facility] ENDOSCOPY;  Service: Endoscopy;  Laterality:  N/A;   HAND SURGERY Left     Medical History: Past Medical History:  Diagnosis Date   GERD (gastroesophageal reflux disease)    Hypertension     Family History: Family History  Problem Relation Age of Onset   Kidney Stones Mother    Cancer Father    Lupus Sister    Cancer Maternal Grandmother    Cancer Paternal Grandmother     Social History   Socioeconomic History   Marital status: Married    Spouse name: Not on file   Number of children: Not on file   Years of education: Not on file   Highest education level: Not on file  Occupational History   Not on file  Tobacco Use   Smoking status: Former    Types: Cigarettes   Smokeless tobacco: Never   Tobacco comments:    quit 11 years ago   Vaping Use   Vaping Use: Never used  Substance and Sexual Activity   Alcohol use: Yes    Comment: 2 shots a month   Drug use: Never    Comment: marijuana 20 years ago   Sexual activity: Yes    Birth control/protection: Condom  Other Topics Concern   Not on file  Social History Narrative   Not on file   Social Determinants of Health   Financial Resource Strain: Not on file  Food Insecurity: Not on file  Transportation Needs: Not on file  Physical Activity: Not on file  Stress: Not on file  Social Connections: Not on file  Intimate Partner Violence: Not on file      Review of Systems  Constitutional:  Negative for activity change, appetite change, chills, fatigue, fever and unexpected weight change.  HENT: Negative.  Negative for congestion, ear pain, rhinorrhea, sore throat and trouble swallowing.   Eyes: Negative.   Respiratory: Negative.  Negative for cough, chest tightness, shortness of breath and wheezing.   Cardiovascular: Negative.  Negative for chest pain.  Gastrointestinal: Negative.  Negative for abdominal pain, blood in stool, constipation, diarrhea, nausea and vomiting.  Endocrine: Negative.   Genitourinary: Negative.  Negative for difficulty urinating,  dysuria, frequency, hematuria and urgency.  Musculoskeletal: Negative.  Negative for arthralgias, back pain, joint swelling, myalgias and neck pain.  Skin: Negative.  Negative for rash and wound.  Allergic/Immunologic: Negative.  Negative for immunocompromised state.  Neurological: Negative.  Negative for dizziness, seizures, numbness and headaches.  Hematological: Negative.   Psychiatric/Behavioral: Negative.  Negative for behavioral problems, self-injury and suicidal ideas. The patient is not nervous/anxious.    Vital Signs: BP (!) 146/106    Pulse 75    Temp 98.5 F (36.9 C)    Resp 16    Ht 5\' 5"  (1.651 m)    Wt 170 lb 3.2 oz (77.2 kg)    SpO2 98%    BMI 28.32 kg/m    Physical Exam Vitals reviewed.  Constitutional:      General: He is awake. He is not in acute distress.    Appearance: Normal appearance. He is well-developed, well-groomed and overweight. He is not ill-appearing or diaphoretic.  HENT:     Head: Normocephalic and atraumatic.     Right Ear: Tympanic membrane, ear canal and external ear normal.     Left Ear: Tympanic membrane, ear canal and external ear normal.     Nose: Nose normal. No congestion or rhinorrhea.     Mouth/Throat:     Lips: Pink.     Mouth: Mucous membranes are moist.     Pharynx: Oropharynx is clear. Uvula midline. No oropharyngeal exudate or posterior oropharyngeal erythema.  Eyes:     General: Lids are normal. Vision grossly intact. Gaze aligned appropriately. No scleral icterus.       Right eye: No discharge.        Left eye: No discharge.     Extraocular Movements: Extraocular movements intact.     Conjunctiva/sclera: Conjunctivae normal.     Pupils: Pupils are equal, round, and reactive to light.     Funduscopic exam:    Right eye: Red reflex present.        Left eye: Red reflex present. Neck:     Thyroid: No thyromegaly.     Vascular: No JVD.     Trachea: Trachea and phonation normal. No tracheal deviation.  Cardiovascular:     Rate  and Rhythm: Normal rate and regular rhythm.     Pulses: Normal pulses.     Heart sounds: Normal heart sounds, S1 normal and S2 normal. No murmur heard.   No friction rub. No gallop.  Pulmonary:     Effort: Pulmonary effort is normal. No accessory muscle usage or respiratory distress.     Breath sounds: Normal breath sounds and air entry. No stridor. No wheezing or rales.  Chest:     Chest wall: No tenderness.  Abdominal:     General: Bowel sounds are normal. There is no distension.     Palpations: Abdomen is soft. There is no shifting dullness, fluid wave, mass or pulsatile mass.     Tenderness: There is no abdominal tenderness. There is no guarding or rebound.  Musculoskeletal:        General: No tenderness or deformity. Normal range of motion.     Cervical back: Normal range of motion and neck supple.     Right lower leg: No edema.     Left lower leg: No edema.  Lymphadenopathy:     Cervical: No cervical adenopathy.  Skin:    General: Skin is warm and dry.     Capillary Refill: Capillary refill takes less than 2 seconds.     Coloration: Skin is not pale.     Findings: No erythema or rash.  Neurological:     Mental Status: He is alert and oriented to person, place, and time.     Cranial Nerves: No cranial nerve deficit.     Motor: No abnormal muscle tone.     Coordination: Coordination normal.     Deep Tendon Reflexes: Reflexes are normal and symmetric.  Psychiatric:        Mood and Affect: Mood normal.        Behavior: Behavior normal. Behavior is cooperative.        Thought Content: Thought content normal.        Judgment: Judgment normal.       Assessment/Plan: 1. Encounter for  routine adult health examination with abnormal findings Age-appropriate preventive screenings and vaccinations discussed, annual physical exam completed. Routine labs for health maintenance drawn in November and discussed with patient today. PHM updated.   2. Essential hypertension BP is  stable with current medication, continue as prescribed. Refills ordered.  - amLODipine (NORVASC) 5 MG tablet; Take 1 tablet (5 mg total) by mouth daily.  Dispense: 90 tablet; Refill: 1  3. Angular cheilitis Treatment prescribed x3 weeks, call clinic if area does not resolve.  - clotrimazole-betamethasone (LOTRISONE) cream; Apply 1 application topically 2 (two) times daily for 21 days. Then may use as needed if it recurs.  Dispense: 45 g; Refill: 0  4. Gastroesophageal reflux disease without esophagitis Taking omeprazole symptoms are well controlled.   5. Mixed hyperlipidemia Estimated 10-year ASCVD risk is 11.9%. patients lipid panel did improve some. Encouraged patient to decrease intake of red meat and increase lean proteins in diet. Patient would also benefit from OTC fish oil supplement. Will discuss adding statin therapy at next office visit.   6. Vitamin D deficiency Slightly low level on labs, patient encouraged to take OTC vitamin D supplement 5000 units daily.   7. Other male erectile dysfunction Refill of sildenafil sent to pharmacy.  - sildenafil (VIAGRA) 100 MG tablet; TAKE 0.5-1 TABLETS BY MOUTH DAILY AS NEEDED FOR ERECTILE DYSFUNCTION.  Dispense: 15 tablet; Refill: 1  8. Muscle cramps Prn ibuprofen prescribed for muscle cramps and soreness related to weight training.  - ibuprofen (ADVIL) 800 MG tablet; Take 1 tablet (800 mg total) by mouth every 8 (eight) hours as needed for mild pain or moderate pain.  Dispense: 60 tablet; Refill: 0  9. Dysuria Routine urinalysis done - UA/M w/rflx Culture, Routine      General Counseling: japhet morgenthaler understanding of the findings of todays visit and agrees with plan of treatment. I have discussed any further diagnostic evaluation that may be needed or ordered today. We also reviewed his medications today. he has been encouraged to call the office with any questions or concerns that should arise related to todays visit.    No  orders of the defined types were placed in this encounter.   Meds ordered this encounter  Medications   clotrimazole-betamethasone (LOTRISONE) cream    Sig: Apply 1 application topically 2 (two) times daily for 21 days. Then may use as needed if it recurs.    Dispense:  45 g    Refill:  0   cetirizine (ZYRTEC ALLERGY) 10 MG tablet    Sig: Take 1 tablet (10 mg total) by mouth daily.    Dispense:  90 tablet    Refill:  1   amLODipine (NORVASC) 5 MG tablet    Sig: Take 1 tablet (5 mg total) by mouth daily.    Dispense:  90 tablet    Refill:  1   sildenafil (VIAGRA) 100 MG tablet    Sig: TAKE 0.5-1 TABLETS BY MOUTH DAILY AS NEEDED FOR ERECTILE DYSFUNCTION.    Dispense:  15 tablet    Refill:  1    DX Code Needed  .   ibuprofen (ADVIL) 800 MG tablet    Sig: Take 1 tablet (800 mg total) by mouth every 8 (eight) hours as needed for mild pain or moderate pain.    Dispense:  60 tablet    Refill:  0    Return in about 3 months (around 06/21/2021) for F/U, med refill, Shalisha Clausing PCP.   Total time spent:30 Minutes Time  spent includes review of chart, medications, test results, and follow up plan with the patient.   [place] Controlled Substance Database was reviewed by me.  This patient was seen by Jonetta Osgood, FNP-C in collaboration with Dr. Clayborn Bigness as a part of collaborative care agreement.  Lyal Husted R. Valetta Fuller, MSN, FNP-C Internal medicine

## 2021-03-23 NOTE — Patient Instructions (Signed)
ANGULAR CHEILITIS  Angular cheilitis, also known as perlche, is an acute or chronic inflammation of the skin and contiguous labial mucosa located at the lateral commissures of the mouth. It typically presents with erythema, maceration, scaling, and fissuring at the corners of the mouth (picture 2A-B). Lesions are most often bilateral and may be painful. Angular cheilitis is caused by excessive moisture and maceration from saliva and secondary infection with Candida albicans or, less commonly, Staphylococcus aureus [26-28]. Angular cheilitis may occur at any age without sex predilection but is especially common in older individuals wearing dentures. Predisposing local factors include wearing orthodontic appliances or ill-fitting dentures, sicca symptoms (dry mouth), intraoral fungal infection, poor oral hygiene, and age-related anatomic changes of the mouth due to reduced vertical facial dimensions [26,29-31]. In older individuals, the loss of vertical dimension of the mouth due to recession of the alveolar ridges or edentulous state leads to drooping of the corners of the mouth, drooling, and retention of saliva in the creases [1,26,30]. Drooling, thumb sucking, and lip licking are frequent causes of angular cheilitis in young children [26,29-31]. Less common causes in both adults and children include nutritional deficiencies, such as B9 (folic acid), zinc, B6 (pyridoxine), B2 (riboflavin), or B3 (niacin) deficiency. Other causes include type 2 diabetes, Sjgren syndrome, immunodeficiency, irritant or allergic reactions to oral hygiene products or denture materials, and medications causing dryness and xerostomia (eg, isotretinoin, acitretin) [17,27,32,33]. Diagnosis -- The diagnosis of angular cheilitis is usually straightforward. A potassium hydroxide (KOH) preparation from lesions and oral mucosa may be needed to confirm or rule out Candida infection. In patients with recalcitrant cheilitis, a lesion swab  for bacterial and fungal culture should be obtained. Treatment -- The management of angular cheilitis involves the control of local predisposing factors and treatment of fungal or bacterial superinfection. General measures to reduce moisture pooling at the corners of the mouth include: ?Improving denture fit and cleaning ?Maintaining optimal oral hygiene ?Treating sicca symptoms (dry mouth) (see "Treatment of dry mouth and other non-ocular sicca symptoms in Sjgren's syndrome", section on 'Treatment of dry mouth') ?Use of barrier creams (eg, zinc oxide paste) or petrolatum For patients with positive KOH preparation, we suggest topical antifungal therapy with azole (eg, miconazole, clotrimazole) ointment. The topical antifungal is applied two times per day for one to three weeks and repeated as necessary. For patients with associated Candida stomatitis, clotrimazole troches (one 10 mg troche dissolved slowly five times daily for two weeks) or oral fluconazole (100 mg per day for one to two weeks) may be beneficial. (See "Oropharyngeal candidiasis in adults".) Staphylococcal infection can be treated with topical mupirocin ointment. Topical mupirocin is applied two times a day for 7 to 14 days. Once cleared, a barrier cream or petrolatum applied nightly can help protect the skin from moisture. However, recurrence of angular cheilitis is common [34].

## 2021-03-24 ENCOUNTER — Encounter: Payer: Self-pay | Admitting: Nurse Practitioner

## 2021-03-24 LAB — MICROSCOPIC EXAMINATION
Bacteria, UA: NONE SEEN
Casts: NONE SEEN /lpf
Epithelial Cells (non renal): NONE SEEN /hpf (ref 0–10)
RBC, Urine: NONE SEEN /hpf (ref 0–2)
WBC, UA: NONE SEEN /hpf (ref 0–5)

## 2021-03-24 LAB — UA/M W/RFLX CULTURE, ROUTINE
Bilirubin, UA: NEGATIVE
Glucose, UA: NEGATIVE
Leukocytes,UA: NEGATIVE
Nitrite, UA: NEGATIVE
Protein,UA: NEGATIVE
RBC, UA: NEGATIVE
Specific Gravity, UA: 1.021 (ref 1.005–1.030)
Urobilinogen, Ur: 1 mg/dL (ref 0.2–1.0)
pH, UA: 7 (ref 5.0–7.5)

## 2021-03-27 ENCOUNTER — Encounter: Payer: Self-pay | Admitting: Nurse Practitioner

## 2021-03-30 ENCOUNTER — Encounter: Payer: Self-pay | Admitting: Gastroenterology

## 2021-04-28 LAB — SURGICAL PATHOLOGY

## 2021-05-30 ENCOUNTER — Other Ambulatory Visit: Payer: Self-pay | Admitting: Nurse Practitioner

## 2021-05-30 DIAGNOSIS — N528 Other male erectile dysfunction: Secondary | ICD-10-CM

## 2021-05-31 NOTE — Telephone Encounter (Signed)
Med sent to pharmacy.

## 2021-06-22 ENCOUNTER — Ambulatory Visit: Payer: BC Managed Care – PPO | Admitting: Nurse Practitioner

## 2021-06-22 ENCOUNTER — Encounter: Payer: Self-pay | Admitting: Nurse Practitioner

## 2021-06-22 VITALS — BP 116/83 | HR 88 | Temp 97.8°F | Resp 16 | Ht 65.0 in | Wt 167.4 lb

## 2021-06-22 DIAGNOSIS — K219 Gastro-esophageal reflux disease without esophagitis: Secondary | ICD-10-CM | POA: Diagnosis not present

## 2021-06-22 DIAGNOSIS — I10 Essential (primary) hypertension: Secondary | ICD-10-CM

## 2021-06-22 DIAGNOSIS — Z6827 Body mass index (BMI) 27.0-27.9, adult: Secondary | ICD-10-CM

## 2021-06-22 DIAGNOSIS — E663 Overweight: Secondary | ICD-10-CM | POA: Diagnosis not present

## 2021-06-22 NOTE — Progress Notes (Signed)
[facility] ?[address] ?[place], [place] [address] ? ?Internal MEDICINE  ?Office Visit Note ? ?Patient Name: Timothy Moore ? [id number]  ?[id number] ? ?Date of Service: 06/22/2021 ? ?Chief Complaint  ?Patient presents with  ? Follow-up  ? Gastroesophageal Reflux  ? Hypertension  ? ? ?HPI ?Delvecchio presents for a follow up visit for hypertension and GERD. His blood pressure has improved significantly and is normal today. He reports that this is the best that he has seen his BP. He has changed jobs, he is doing different work, likes his new job better and moves around a lot more. He has been working on decreasing his stress level and not letting little things get to him. He is in good spirits at his office visit today.  ?He has also lost 3 lbs since his previous office visit.  ? ? ? ?Current Medication: ?Outpatient Encounter Medications as of 06/22/2021  ?Medication Sig  ? amLODipine (NORVASC) 5 MG tablet Take 1 tablet (5 mg total) by mouth daily.  ? cetirizine (ZYRTEC ALLERGY) 10 MG tablet Take 1 tablet (10 mg total) by mouth daily.  ? ibuprofen (ADVIL) 800 MG tablet Take 1 tablet (800 mg total) by mouth every 8 (eight) hours as needed for mild pain or moderate pain.  ? Multiple Vitamin (MULTIVITAMIN) tablet Take 1 tablet by mouth daily.  ? sildenafil (VIAGRA) 100 MG tablet TAKE 1/2 TO 1 (ONE-HALF TO ONE) TABLET BY MOUTH ONCE DAILY AS NEEDED FOR ERECTILE DYSFUNCTION  ? omeprazole (PRILOSEC) 20 MG capsule Take 1 capsule (20 mg total) by mouth in the morning and at bedtime for 28 days.  ? ?No facility-administered encounter medications on file as of 06/22/2021.  ? ? ?Surgical History: ?Past Surgical History:  ?Procedure Laterality Date  ? COLONOSCOPY WITH PROPOFOL N/A 05/29/2020  ? Procedure: COLONOSCOPY WITH PROPOFOL;  Surgeon: [name], MD;  Location: [facility] ENDOSCOPY;  Service: Endoscopy;  Laterality: N/A;  ? ESOPHAGOGASTRODUODENOSCOPY (EGD) WITH PROPOFOL N/A 05/29/2020  ? Procedure:  ESOPHAGOGASTRODUODENOSCOPY (EGD) WITH PROPOFOL;  Surgeon: [name], MD;  Location: [facility] ENDOSCOPY;  Service: Endoscopy;  Laterality: N/A;  ? ESOPHAGOGASTRODUODENOSCOPY (EGD) WITH PROPOFOL N/A 03/12/2021  ? Procedure: ESOPHAGOGASTRODUODENOSCOPY (EGD) WITH PROPOFOL;  Surgeon: [name], MD;  Location: [facility] ENDOSCOPY;  Service: Endoscopy;  Laterality: N/A;  ? HAND SURGERY Left   ? ? ?Medical History: ?Past Medical History:  ?Diagnosis Date  ? GERD (gastroesophageal reflux disease)   ? Hypertension   ? ? ?Family History: ?Family History  ?Problem Relation Age of Onset  ? Kidney Stones Mother   ? Cancer Father   ? Lupus Sister   ? Cancer Maternal Grandmother   ? Cancer Paternal Grandmother   ? ? ?Social History  ? ?Socioeconomic History  ? Marital status: Married  ?  Spouse name: Not on file  ? Number of children: Not on file  ? Years of education: Not on file  ? Highest education level: Not on file  ?Occupational History  ? Not on file  ?Tobacco Use  ? Smoking status: Former  ?  Types: Cigarettes  ? Smokeless tobacco: Never  ? Tobacco comments:  ?  quit 11 years ago   ?Vaping Use  ? Vaping Use: Never used  ?Substance and Sexual Activity  ? Alcohol use: Yes  ?  Comment: 2 shots a month  ? Drug use: Never  ?  Comment: marijuana 20 years ago  ? Sexual activity: Yes  ?  Birth control/protection: Condom  ?  Other Topics Concern  ? Not on file  ?Social History Narrative  ? Not on file  ? ?Social Determinants of Health  ? ?Financial Resource Strain: Not on file  ?Food Insecurity: Not on file  ?Transportation Needs: Not on file  ?Physical Activity: Not on file  ?Stress: Not on file  ?Social Connections: Not on file  ?Intimate Partner Violence: Not on file  ? ? ? ? ?Review of Systems  ?Constitutional:  Negative for chills, fatigue and unexpected weight change.  ?HENT:  Negative for congestion, rhinorrhea, sneezing and sore throat.   ?Eyes:  Negative for redness.  ?Respiratory:  Negative for cough, chest  tightness and shortness of breath.   ?Cardiovascular:  Negative for chest pain and palpitations.  ?Gastrointestinal:  Negative for abdominal pain, constipation, diarrhea, nausea and vomiting.  ?Genitourinary:  Negative for dysuria and frequency.  ?Musculoskeletal:  Negative for arthralgias, back pain, joint swelling and neck pain.  ?Skin:  Negative for rash.  ?Neurological: Negative.  Negative for tremors and numbness.  ?Hematological:  Negative for adenopathy. Does not bruise/bleed easily.  ?Psychiatric/Behavioral:  Negative for behavioral problems (Depression), sleep disturbance and suicidal ideas. The patient is not nervous/anxious.   ? ?Vital Signs: ?BP 116/83   Pulse 88   Temp 97.8 ?F (36.6 ?C)   Resp 16   Ht '5\' 5"'$  (1.651 m)   Wt 167 lb 6.4 oz (75.9 kg)   SpO2 96%   BMI 27.86 kg/m?  ? ? ?Physical Exam ?Vitals reviewed.  ?Constitutional:   ?   General: He is not in acute distress. ?   Appearance: Normal appearance. He is not ill-appearing.  ?HENT:  ?   Head: Normocephalic and atraumatic.  ?Eyes:  ?   Pupils: Pupils are equal, round, and reactive to light.  ?Cardiovascular:  ?   Rate and Rhythm: Normal rate and regular rhythm.  ?Pulmonary:  ?   Effort: Pulmonary effort is normal. No respiratory distress.  ?Neurological:  ?   Mental Status: He is alert and oriented to person, place, and time.  ?Psychiatric:     ?   Mood and Affect: Mood normal.     ?   Behavior: Behavior normal.  ? ? ? ? ? ?Assessment/Plan: ?1. Essential hypertension ?BP is stable and well controlled with amlodipine 5 mg daily.  ? ?2. Gastroesophageal reflux disease without esophagitis ?Well controlled with current medication  ? ?3. Overweight with body mass index (BMI) of 27 to 27.9 in adult ?Lost 3 lbs, is walking more at his work, tries to eat healthy. He also works out regularly at [employer].  ? ? ?General Counseling: marquan vokes understanding of the findings of todays visit and agrees with plan of treatment. I have discussed any  further diagnostic evaluation that may be needed or ordered today. We also reviewed his medications today. he has been encouraged to call the office with any questions or concerns that should arise related to todays visit. ? ? ? ?No orders of the defined types were placed in this encounter. ? ? ?No orders of the defined types were placed in this encounter. ? ? ?Return in about 3 months (around 09/21/2021) for F/U, BP check, Chozen Latulippe PCP. ? ? ?Total time spent:30 Minutes ?Time spent includes review of chart, medications, test results, and follow up plan with the patient.  ? ?[place] Controlled Substance Database was reviewed by me. ? ?This patient was seen by Jonetta Osgood, FNP-C in collaboration with Dr. Clayborn Bigness as a part of  collaborative care agreement. ? ? ?Denielle Bayard R. Valetta Fuller, MSN, FNP-C ?Internal medicine  ?

## 2021-06-26 ENCOUNTER — Encounter: Payer: Self-pay | Admitting: Nurse Practitioner

## 2021-07-25 ENCOUNTER — Other Ambulatory Visit: Payer: Self-pay | Admitting: Nurse Practitioner

## 2021-07-25 DIAGNOSIS — J018 Other acute sinusitis: Secondary | ICD-10-CM

## 2021-08-10 ENCOUNTER — Other Ambulatory Visit: Payer: Self-pay | Admitting: Nurse Practitioner

## 2021-08-10 DIAGNOSIS — N528 Other male erectile dysfunction: Secondary | ICD-10-CM

## 2021-08-11 MED ORDER — SILDENAFIL CITRATE 100 MG PO TABS: ORAL_TABLET | ORAL | 2 refills | Status: DC

## 2021-08-29 ENCOUNTER — Other Ambulatory Visit: Payer: Self-pay | Admitting: Nurse Practitioner

## 2021-08-29 DIAGNOSIS — I10 Essential (primary) hypertension: Secondary | ICD-10-CM

## 2021-09-06 ENCOUNTER — Other Ambulatory Visit: Payer: Self-pay | Admitting: Nurse Practitioner

## 2021-09-06 DIAGNOSIS — I10 Essential (primary) hypertension: Secondary | ICD-10-CM

## 2021-09-06 MED ORDER — AMLODIPINE BESYLATE 5 MG PO TABS: 5.0000 mg | ORAL_TABLET | Freq: Every day | ORAL | 0 refills | Status: DC

## 2021-09-06 MED ORDER — OMEPRAZOLE 20 MG PO CPDR: 20.0000 mg | DELAYED_RELEASE_CAPSULE | Freq: Every day | ORAL | 0 refills | Status: DC

## 2021-09-21 ENCOUNTER — Encounter: Payer: Self-pay | Admitting: Nurse Practitioner

## 2021-09-21 ENCOUNTER — Ambulatory Visit: Payer: BC Managed Care – PPO | Admitting: Nurse Practitioner

## 2021-09-21 VITALS — BP 132/68 | HR 75 | Temp 98.4°F | Resp 16 | Ht 66.0 in | Wt 167.8 lb

## 2021-09-21 DIAGNOSIS — I10 Essential (primary) hypertension: Secondary | ICD-10-CM

## 2021-09-21 DIAGNOSIS — E663 Overweight: Secondary | ICD-10-CM

## 2021-09-21 DIAGNOSIS — K219 Gastro-esophageal reflux disease without esophagitis: Secondary | ICD-10-CM

## 2021-09-21 DIAGNOSIS — N528 Other male erectile dysfunction: Secondary | ICD-10-CM

## 2021-09-21 DIAGNOSIS — Z6827 Body mass index (BMI) 27.0-27.9, adult: Secondary | ICD-10-CM

## 2021-09-21 MED ORDER — AMLODIPINE BESYLATE 5 MG PO TABS: 5.0000 mg | ORAL_TABLET | Freq: Every day | ORAL | 1 refills | Status: DC

## 2021-09-21 MED ORDER — SILDENAFIL CITRATE 100 MG PO TABS: ORAL_TABLET | ORAL | 2 refills | Status: DC

## 2021-09-21 NOTE — Progress Notes (Signed)
[facility] [place], [place] [address]  Internal MEDICINE  Office Visit Note  Patient Name: Timothy Moore  [id number]  [id number]  Date of Service: 09/21/2021  Chief Complaint  Patient presents with   Follow-up   Gastroesophageal Reflux   Hypertension    HPI Wilfrid presents for a follow-up visit for hypertension, GERD and erectile dysfunction.  He continues to take amlodipine 5 mg daily and his blood pressure remains well controlled with this dose.  He does need refills of amlodipine. He needs refills on sildenafil The omeprazole is still effective in controlling his acid reflux related to GERD.  He does not need any refills for this medication   Current Medication: Outpatient Encounter Medications as of 09/21/2021  Medication Sig   cetirizine (ZYRTEC ALLERGY) 10 MG tablet Take 1 tablet (10 mg total) by mouth daily.   ibuprofen (ADVIL) 800 MG tablet Take 1 tablet (800 mg total) by mouth every 8 (eight) hours as needed for mild pain or moderate pain.   Multiple Vitamin (MULTIVITAMIN) tablet Take 1 tablet by mouth daily.   omeprazole (PRILOSEC) 20 MG capsule Take 1 capsule (20 mg total) by mouth daily.   [DISCONTINUED] amLODipine (NORVASC) 5 MG tablet Take 1 tablet (5 mg total) by mouth daily.   [DISCONTINUED] sildenafil (VIAGRA) 100 MG tablet TAKE 1/2 TO 1 (ONE-HALF TO ONE) TABLET BY MOUTH ONCE DAILY AS NEEDED FOR ERECTILE DYSFUNCTION   amLODipine (NORVASC) 5 MG tablet Take 1 tablet (5 mg total) by mouth daily.   sildenafil (VIAGRA) 100 MG tablet TAKE 1/2 TO 1 (ONE-HALF TO ONE) TABLET BY MOUTH ONCE DAILY AS NEEDED FOR ERECTILE DYSFUNCTION   No facility-administered encounter medications on file as of 09/21/2021.    Surgical History: Past Surgical History:  Procedure Laterality Date   COLONOSCOPY WITH PROPOFOL N/A 05/29/2020   Procedure: COLONOSCOPY WITH PROPOFOL;  Surgeon: [name], MD;  Location: [facility] ENDOSCOPY;  Service: Endoscopy;   Laterality: N/A;   ESOPHAGOGASTRODUODENOSCOPY (EGD) WITH PROPOFOL N/A 05/29/2020   Procedure: ESOPHAGOGASTRODUODENOSCOPY (EGD) WITH PROPOFOL;  Surgeon: [name], MD;  Location: [facility] ENDOSCOPY;  Service: Endoscopy;  Laterality: N/A;   ESOPHAGOGASTRODUODENOSCOPY (EGD) WITH PROPOFOL N/A 03/12/2021   Procedure: ESOPHAGOGASTRODUODENOSCOPY (EGD) WITH PROPOFOL;  Surgeon: [name], MD;  Location: [facility] ENDOSCOPY;  Service: Endoscopy;  Laterality: N/A;   HAND SURGERY Left     Medical History: Past Medical History:  Diagnosis Date   GERD (gastroesophageal reflux disease)    Hypertension     Family History: Family History  Problem Relation Age of Onset   Kidney Stones Mother    Cancer Father    Lupus Sister    Cancer Maternal Grandmother    Cancer Paternal Grandmother     Social History   Socioeconomic History   Marital status: Married    Spouse name: Not on file   Number of children: Not on file   Years of education: Not on file   Highest education level: Not on file  Occupational History   Not on file  Tobacco Use   Smoking status: Former    Types: Cigarettes   Smokeless tobacco: Never   Tobacco comments:    quit 11 years ago   Vaping Use   Vaping Use: Never used  Substance and Sexual Activity   Alcohol use: Yes    Comment: 2 shots a month   Drug use: Never    Comment: marijuana 20 years ago   Sexual activity: Yes  Birth control/protection: Condom  Other Topics Concern   Not on file  Social History Narrative   Not on file   Social Determinants of Health   Financial Resource Strain: Not on file  Food Insecurity: Not on file  Transportation Needs: Not on file  Physical Activity: Not on file  Stress: Not on file  Social Connections: Not on file  Intimate Partner Violence: Not on file      Review of Systems  Constitutional:  Negative for chills, fatigue and unexpected weight change.  HENT:  Negative for congestion, rhinorrhea, sneezing  and sore throat.   Eyes:  Negative for redness.  Respiratory:  Negative for cough, chest tightness and shortness of breath.   Cardiovascular:  Negative for chest pain and palpitations.  Gastrointestinal:  Negative for abdominal pain, constipation, diarrhea, nausea and vomiting.  Genitourinary:  Negative for dysuria and frequency.  Musculoskeletal:  Negative for arthralgias, back pain, joint swelling and neck pain.  Skin:  Negative for rash.  Neurological: Negative.  Negative for tremors and numbness.  Hematological:  Negative for adenopathy. Does not bruise/bleed easily.  Psychiatric/Behavioral:  Negative for behavioral problems (Depression), sleep disturbance and suicidal ideas. The patient is not nervous/anxious.     Vital Signs: BP 132/68   Pulse 75   Temp 98.4 F (36.9 C)   Resp 16   Ht '5\' 6"'$  (1.676 m)   Wt 167 lb 12.8 oz (76.1 kg)   SpO2 98%   BMI 27.08 kg/m    Physical Exam Vitals reviewed.  Constitutional:      General: He is not in acute distress.    Appearance: Normal appearance. He is not ill-appearing.  HENT:     Head: Normocephalic and atraumatic.  Eyes:     Pupils: Pupils are equal, round, and reactive to light.  Cardiovascular:     Rate and Rhythm: Normal rate and regular rhythm.  Pulmonary:     Effort: Pulmonary effort is normal. No respiratory distress.  Neurological:     Mental Status: He is alert and oriented to person, place, and time.  Psychiatric:        Mood and Affect: Mood normal.        Behavior: Behavior normal.        Assessment/Plan: 1. Essential hypertension Well-controlled with current medication, refills ordered. - amLODipine (NORVASC) 5 MG tablet; Take 1 tablet (5 mg total) by mouth daily.  Dispense: 90 tablet; Refill: 1  2. Other male erectile dysfunction Patient reports this medication remains effective, refills ordered. - sildenafil (VIAGRA) 100 MG tablet; TAKE 1/2 TO 1 (ONE-HALF TO ONE) TABLET BY MOUTH ONCE DAILY AS NEEDED  FOR ERECTILE DYSFUNCTION  Dispense: 15 tablet; Refill: 2  3. Gastroesophageal reflux disease without esophagitis Continue omeprazole as prescribed, no refills needed at this time  4. Overweight with body mass index (BMI) of 27 to 27.9 in adult Patient has not gained any weight and feels good at current weight   General Counseling: yasmin dibello understanding of the findings of todays visit and agrees with plan of treatment. I have discussed any further diagnostic evaluation that may be needed or ordered today. We also reviewed his medications today. he has been encouraged to call the office with any questions or concerns that should arise related to todays visit.    No orders of the defined types were placed in this encounter.   Meds ordered this encounter  Medications   sildenafil (VIAGRA) 100 MG tablet    Sig: TAKE 1/2  TO 1 (ONE-HALF TO ONE) TABLET BY MOUTH ONCE DAILY AS NEEDED FOR ERECTILE DYSFUNCTION    Dispense:  15 tablet    Refill:  2   amLODipine (NORVASC) 5 MG tablet    Sig: Take 1 tablet (5 mg total) by mouth daily.    Dispense:  90 tablet    Refill:  1    Return in about 2 months (around 11/22/2021) for F/U, BP check, Phil Michels PCP.   Total time spent:30 Minutes Time spent includes review of chart, medications, test results, and follow up plan with the patient.   [place] Controlled Substance Database was reviewed by me.  This patient was seen by Jonetta Osgood, FNP-C in collaboration with Dr. Clayborn Bigness as a part of collaborative care agreement.   Veretta Sabourin R. Valetta Fuller, MSN, FNP-C Internal medicine

## 2021-10-15 ENCOUNTER — Ambulatory Visit (INDEPENDENT_AMBULATORY_CARE_PROVIDER_SITE_OTHER): Payer: BC Managed Care – PPO

## 2021-10-15 ENCOUNTER — Ambulatory Visit (HOSPITAL_COMMUNITY)
Admission: EM | Admit: 2021-10-15 | Discharge: 2021-10-15 | Disposition: A | Discharge status: Home / Self Care | Payer: BC Managed Care – PPO | Attending: Family Medicine | Admitting: Family Medicine

## 2021-10-15 ENCOUNTER — Encounter (HOSPITAL_COMMUNITY): Payer: Self-pay

## 2021-10-15 DIAGNOSIS — R252 Cramp and spasm: Secondary | ICD-10-CM | POA: Diagnosis not present

## 2021-10-15 DIAGNOSIS — M545 Low back pain, unspecified: Secondary | ICD-10-CM

## 2021-10-15 DIAGNOSIS — M5441 Lumbago with sciatica, right side: Secondary | ICD-10-CM

## 2021-10-15 LAB — POCT URINALYSIS DIPSTICK, ED / UC
Bilirubin Urine: NEGATIVE
Glucose, UA: NEGATIVE mg/dL
Hgb urine dipstick: NEGATIVE
Leukocytes,Ua: NEGATIVE
Nitrite: NEGATIVE
Protein, ur: NEGATIVE mg/dL
Specific Gravity, Urine: >=1.03 (ref 1.005–1.030)
Urobilinogen, UA: 1 mg/dL (ref 0.0–1.0)
pH: 5.5 (ref 5.0–8.0)

## 2021-10-15 MED ORDER — TRAMADOL HCL 50 MG PO TABS: 50.0000 mg | ORAL_TABLET | Freq: Four times a day (QID) | ORAL | 0 refills | Status: DC | PRN

## 2021-10-15 MED ORDER — KETOROLAC TROMETHAMINE 30 MG/ML IJ SOLN
30.0000 mg | Freq: Once | INTRAMUSCULAR | Status: AC
  Administered 2021-10-15: 30 mg via INTRAMUSCULAR

## 2021-10-15 MED ORDER — IBUPROFEN 800 MG PO TABS: 800.0000 mg | ORAL_TABLET | Freq: Three times a day (TID) | ORAL | 0 refills | Status: DC | PRN

## 2021-10-15 MED ORDER — KETOROLAC TROMETHAMINE 30 MG/ML IJ SOLN
INTRAMUSCULAR | Status: AC
  Filled 2021-10-15: qty 1

## 2021-10-15 NOTE — Discharge Instructions (Addendum)
Your urine was concentrated, possibly showing you need to drink more water.  There is no blood in it.  Your x-rays did not show any acute bony abnormality.  There could be a disc problem.  You have been given a shot of Toradol 30 mg today.  Take ibuprofen 800 mg--1 tab every 8 hours as needed for pain.  Take tramadol 50 mg-- 1 tablet every 6 hours as needed for pain.  This medication can make you sleepy or dizzy

## 2021-10-15 NOTE — ED Triage Notes (Signed)
Patient having low back pain for 2 days. Patient states he lifted a couch that day as well. Patient states his urine is dark.   Patient states he is also fatigued. He did take a muscle relaxer yesterday however. Patient states this did not help the pain. Pain is worse today.   Pain is radiating down into the right leg. States it is numbness in he leg but a sharp throbbing pain in the low back.

## 2021-10-15 NOTE — ED Provider Notes (Signed)
[facility]    CSN: [id number] Arrival date & time: 10/15/21  1734      History   Chief Complaint Chief Complaint  Patient presents with   Back Pain   Leg Pain    HPI Timothy Moore is a 56 y.o. male.    Back Pain Associated symptoms: leg pain   Leg Pain Associated symptoms: back pain    Here for right low back pain that began about 2 days ago.  It began while he was lifting a couch.  It is hurt ever since, and radiates down his right leg.  He has had associated numbness and tingling in that right leg.  He did have 1 episode of possible bowel incontinence, but that has not happened since he was always had normal bowel movements since then.  That happened about 2 days ago.  He is uncertain if he has had fever, and he notes that his urine is dark.  He does not have dysuria.  Past Medical History:  Diagnosis Date   GERD (gastroesophageal reflux disease)    Hypertension     Patient Active Problem List   Diagnosis Date Noted   History of Helicobacter pylori infection    Gastric intestinal metaplasia    Acute gastric erosion    Encounter for screening for upper gastrointestinal disorder    Schatzki's ring    Duodenal nodule    Gastric erythema    Screen for colon cancer    Polyp of sigmoid colon     Past Surgical History:  Procedure Laterality Date   COLONOSCOPY WITH PROPOFOL N/A 05/29/2020   Procedure: COLONOSCOPY WITH PROPOFOL;  Surgeon: [name], MD;  Location: [facility] ENDOSCOPY;  Service: Endoscopy;  Laterality: N/A;   ESOPHAGOGASTRODUODENOSCOPY (EGD) WITH PROPOFOL N/A 05/29/2020   Procedure: ESOPHAGOGASTRODUODENOSCOPY (EGD) WITH PROPOFOL;  Surgeon: [name], MD;  Location: [facility] ENDOSCOPY;  Service: Endoscopy;  Laterality: N/A;   ESOPHAGOGASTRODUODENOSCOPY (EGD) WITH PROPOFOL N/A 03/12/2021   Procedure: ESOPHAGOGASTRODUODENOSCOPY (EGD) WITH PROPOFOL;  Surgeon: [name], MD;  Location: [facility] ENDOSCOPY;  Service: Endoscopy;   Laterality: N/A;   HAND SURGERY Left        Home Medications    Prior to Admission medications   Medication Sig Start Date End Date Taking? Authorizing Provider  amLODipine (NORVASC) 5 MG tablet Take 1 tablet (5 mg total) by mouth daily. 09/21/21  Yes Abernathy, Yetta Flock, NP  ibuprofen (ADVIL) 800 MG tablet Take 1 tablet (800 mg total) by mouth every 8 (eight) hours as needed (pain). 10/15/21  Yes Barrett Henle, MD  omeprazole (PRILOSEC) 20 MG capsule Take 1 capsule (20 mg total) by mouth daily. 09/06/21  Yes Abernathy, Yetta Flock, NP  traMADol (ULTRAM) 50 MG tablet Take 1 tablet (50 mg total) by mouth every 6 (six) hours as needed (pain). 10/15/21  Yes Barrett Henle, MD  cetirizine (ZYRTEC ALLERGY) 10 MG tablet Take 1 tablet (10 mg total) by mouth daily. 03/23/21   Jonetta Osgood, NP  Multiple Vitamin (MULTIVITAMIN) tablet Take 1 tablet by mouth daily.    [provider]  sildenafil (VIAGRA) 100 MG tablet TAKE 1/2 TO 1 (ONE-HALF TO ONE) TABLET BY MOUTH ONCE DAILY AS NEEDED FOR ERECTILE DYSFUNCTION 09/21/21   Jonetta Osgood, NP    Family History Family History  Problem Relation Age of Onset   Kidney Stones Mother    Cancer Father    Lupus Sister    Cancer Maternal Grandmother    Cancer Paternal Grandmother  Social History Social History   Tobacco Use   Smoking status: Former    Types: Cigarettes   Smokeless tobacco: Never   Tobacco comments:    quit 11 years ago   Vaping Use   Vaping Use: Never used  Substance Use Topics   Alcohol use: Yes    Comment: 2 shots a month   Drug use: Never    Comment: marijuana 20 years ago     Allergies   Patient has no known allergies.   Review of Systems Review of Systems  Musculoskeletal:  Positive for back pain.     Physical Exam Triage Vital Signs ED Triage Vitals  Enc Vitals Group     BP 10/15/21 1825 120/83     Pulse Rate 10/15/21 1825 75     Resp 10/15/21 1825 16     Temp 10/15/21 1825 98.3 F (36.8  C)     Temp Source 10/15/21 1825 Oral     SpO2 10/15/21 1825 96 %     Weight 10/15/21 1824 170 lb (77.1 kg)     Height 10/15/21 1824 '5\' 5"'$  (1.651 m)     Head Circumference --      Peak Flow --      Pain Score 10/15/21 1824 10     Pain Loc --      Pain Edu? --      Excl. in [place]? --    No data found.  Updated Vital Signs BP 120/83 (BP Location: Right Arm)   Pulse 75   Temp 98.3 F (36.8 C) (Oral)   Resp 16   Ht '5\' 5"'$  (1.651 m)   Wt 77.1 kg   SpO2 96%   BMI 28.29 kg/m   Visual Acuity Right Eye Distance:   Left Eye Distance:   Bilateral Distance:    Right Eye Near:   Left Eye Near:    Bilateral Near:     Physical Exam Vitals reviewed.  Constitutional:      General: He is not in acute distress.    Appearance: He is not ill-appearing, toxic-appearing or diaphoretic.  HENT:     Mouth/Throat:     Mouth: Mucous membranes are moist.     Pharynx: No oropharyngeal exudate or posterior oropharyngeal erythema.  Eyes:     Extraocular Movements: Extraocular movements intact.     Conjunctiva/sclera: Conjunctivae normal.     Pupils: Pupils are equal, round, and reactive to light.  Cardiovascular:     Rate and Rhythm: Normal rate and regular rhythm.     Heart sounds: No murmur heard. Pulmonary:     Effort: Pulmonary effort is normal.     Breath sounds: Normal breath sounds.  Abdominal:     Palpations: Abdomen is soft.     Tenderness: There is no abdominal tenderness. There is no right CVA tenderness or left CVA tenderness.  Musculoskeletal:        General: No tenderness.     Cervical back: Neck supple.     Comments: His lumbar area is not really tender, but he does have pain radiating to his right calf when straight leg raises done.  Lymphadenopathy:     Cervical: No cervical adenopathy.  Skin:    Coloration: Skin is not pale.  Neurological:     General: No focal deficit present.     Mental Status: He is oriented to person, place, and time.  Psychiatric:         Behavior: Behavior normal.  UC Treatments / Results  Labs (all labs ordered are listed, but only abnormal results are displayed) Labs Reviewed  POCT URINALYSIS DIPSTICK, ED / UC - Abnormal; Notable for the following components:      Result Value   Ketones, ur TRACE (*)    All other components within normal limits    EKG   Radiology DG Lumbar Spine 2-3 Views  Result Date: 10/15/2021 CLINICAL DATA:  Right low back pain EXAM: LUMBAR SPINE - 2-3 VIEW COMPARISON:  None Available. FINDINGS: There is no evidence of lumbar spine fracture. Alignment is normal. Intervertebral disc spaces are maintained. IMPRESSION: Negative. Electronically Signed   By: Rolm Baptise M.D.   On: 10/15/2021 18:58    Procedures Procedures (including critical care time)  Medications Ordered in UC Medications  ketorolac (TORADOL) 30 MG/ML injection 30 mg (has no administration in time range)    Initial Impression / Assessment and Plan / UC Course  I have reviewed the triage vital signs and the nursing notes.  Pertinent labs & imaging results that were available during my care of the patient were reviewed by me and considered in my medical decision making (see chart for details).     Urinalysis shows the urine to be concentrated with a trace of ketones, but there is no blood or white blood cells.  X-rays show no acute bony abnormalities Final Clinical Impressions(s) / UC Diagnoses   Final diagnoses:  Acute right-sided low back pain with right-sided sciatica     Discharge Instructions      Your urine was concentrated, possibly showing you need to drink more water.  There is no blood in it.  Your x-rays did not show any acute bony abnormality.  There could be a disc problem.  You have been given a shot of Toradol 30 mg today.  Take ibuprofen 800 mg--1 tab every 8 hours as needed for pain.  Take tramadol 50 mg-- 1 tablet every 6 hours as needed for pain.  This medication can make you sleepy  or dizzy       ED Prescriptions     Medication Sig Dispense Auth. Provider   ibuprofen (ADVIL) 800 MG tablet Take 1 tablet (800 mg total) by mouth every 8 (eight) hours as needed (pain). 21 tablet Rankin Coolman, Gwenlyn Perking, MD   traMADol (ULTRAM) 50 MG tablet Take 1 tablet (50 mg total) by mouth every 6 (six) hours as needed (pain). 10 tablet Windy Carina Gwenlyn Perking, MD      I have reviewed the PDMP during this encounter.   Barrett Henle, MD 10/15/21 [phone]

## 2021-11-03 ENCOUNTER — Encounter: Payer: Self-pay | Admitting: Nurse Practitioner

## 2021-11-03 ENCOUNTER — Telehealth: Payer: Self-pay

## 2021-11-03 NOTE — Telephone Encounter (Signed)
Patient's partner told him that she tested positive for trich, patient wants to come in and be tested, transferred call to Ernestine Mcmurray to schedule appt for tomorrow.

## 2021-11-04 ENCOUNTER — Encounter: Payer: Self-pay | Admitting: Nurse Practitioner

## 2021-11-04 ENCOUNTER — Ambulatory Visit: Payer: BC Managed Care – PPO | Admitting: Nurse Practitioner

## 2021-11-04 VITALS — BP 137/90 | HR 82 | Temp 97.8°F | Resp 16 | Ht 65.0 in | Wt 165.0 lb

## 2021-11-04 DIAGNOSIS — M25561 Pain in right knee: Secondary | ICD-10-CM

## 2021-11-04 DIAGNOSIS — Z113 Encounter for screening for infections with a predominantly sexual mode of transmission: Secondary | ICD-10-CM | POA: Diagnosis not present

## 2021-11-04 MED ORDER — COLCHICINE 0.6 MG PO TABS: ORAL_TABLET | ORAL | 0 refills | Status: DC

## 2021-11-04 NOTE — Progress Notes (Signed)
[facility] [place], [place] [address]  Internal MEDICINE  Office Visit Note  Patient Name: Timothy Moore  [id number]  [id number]  Date of Service: 11/04/2021  Chief Complaint  Patient presents with   Acute Visit   Knee Pain    Right going on  for 1 week      HPI Edrick presents for an acute sick visit for STD exposure and right knee pain.  Wants to be tested for STDs.  Not sure if he may have gout. In his right knee   Current Medication:  Outpatient Encounter Medications as of 11/04/2021  Medication Sig   amLODipine (NORVASC) 5 MG tablet Take 1 tablet (5 mg total) by mouth daily.   cetirizine (ZYRTEC ALLERGY) 10 MG tablet Take 1 tablet (10 mg total) by mouth daily.   colchicine 0.6 MG tablet Take 2 tablets by mouth once then take 1 more tablet by mouth 1 hour later, then take 1 tablet by mouth once daily until gout flare resolves.   ibuprofen (ADVIL) 800 MG tablet Take 1 tablet (800 mg total) by mouth every 8 (eight) hours as needed (pain).   Multiple Vitamin (MULTIVITAMIN) tablet Take 1 tablet by mouth daily.   omeprazole (PRILOSEC) 20 MG capsule Take 1 capsule (20 mg total) by mouth daily.   sildenafil (VIAGRA) 100 MG tablet TAKE 1/2 TO 1 (ONE-HALF TO ONE) TABLET BY MOUTH ONCE DAILY AS NEEDED FOR ERECTILE DYSFUNCTION   traMADol (ULTRAM) 50 MG tablet Take 1 tablet (50 mg total) by mouth every 6 (six) hours as needed (pain).   No facility-administered encounter medications on file as of 11/04/2021.      Medical History: Past Medical History:  Diagnosis Date   GERD (gastroesophageal reflux disease)    Hypertension      Vital Signs: BP (!) 137/90   Pulse 82   Temp 97.8 F (36.6 C)   Resp 16   Ht '5\' 5"'$  (1.651 m)   Wt 165 lb (74.8 kg)   SpO2 99%   BMI 27.46 kg/m    Review of Systems  Constitutional:  Negative for chills, fatigue and unexpected weight change.  HENT:  Negative for congestion, rhinorrhea, sneezing and sore throat.    Eyes:  Negative for redness.  Respiratory:  Negative for cough, chest tightness and shortness of breath.   Cardiovascular:  Negative for chest pain and palpitations.  Gastrointestinal:  Negative for abdominal pain, constipation, diarrhea, nausea and vomiting.  Genitourinary:  Positive for frequency and urgency. Negative for dysuria.  Musculoskeletal:  Negative for arthralgias, back pain, joint swelling and neck pain.  Neurological: Negative.  Negative for tremors and numbness.  Hematological:  Negative for adenopathy. Does not bruise/bleed easily.  Psychiatric/Behavioral:  Negative for behavioral problems (Depression), sleep disturbance and suicidal ideas. The patient is not nervous/anxious.     Physical Exam Vitals reviewed.  Constitutional:      General: He is not in acute distress.    Appearance: Normal appearance. He is not ill-appearing.  HENT:     Head: Normocephalic and atraumatic.  Eyes:     Pupils: Pupils are equal, round, and reactive to light.  Cardiovascular:     Rate and Rhythm: Normal rate and regular rhythm.  Pulmonary:     Effort: Pulmonary effort is normal. No respiratory distress.  Neurological:     Mental Status: He is alert and oriented to person, place, and time.  Psychiatric:        Mood and Affect:  Mood normal.        Behavior: Behavior normal.       Assessment/Plan: 1. Acute pain of right knee Discussed OTC treatments and nonpharmacologic interventions. Sent script for colchicine if still no improvement as this may be a gout attack  2. Screening for STDs (sexually transmitted diseases) Urine specimen sent for testing - Chlamydia/Gonococcus/Trichomonas, NAA   General Counseling: mayra brahm understanding of the findings of todays visit and agrees with plan of treatment. I have discussed any further diagnostic evaluation that may be needed or ordered today. We also reviewed his medications today. he has been encouraged to call the office with  any questions or concerns that should arise related to todays visit.    Counseling:    Orders Placed This Encounter  Procedures   Chlamydia/Gonococcus/Trichomonas, NAA    Meds ordered this encounter  Medications   colchicine 0.6 MG tablet    Sig: Take 2 tablets by mouth once then take 1 more tablet by mouth 1 hour later, then take 1 tablet by mouth once daily until gout flare resolves.    Dispense:  30 tablet    Refill:  0    For acute gout attack, new script, fill asap    Return for F/U, BP check, Brealynn Contino PCP pls reschedule 9/12 for sometime in october.  [place] Controlled Substance Database was reviewed by me for overdose risk score (ORS)  Time spent:20 Minutes Time spent with patient included reviewing progress notes, labs, imaging studies, and discussing plan for follow up.   This patient was seen by Jonetta Osgood, FNP-C in collaboration with Dr. Clayborn Bigness as a part of collaborative care agreement.  Raetta Agostinelli R. Valetta Fuller, MSN, FNP-C Internal Medicine

## 2021-11-07 ENCOUNTER — Encounter: Payer: Self-pay | Admitting: Nurse Practitioner

## 2021-11-09 ENCOUNTER — Other Ambulatory Visit: Payer: Self-pay | Admitting: Nurse Practitioner

## 2021-11-09 ENCOUNTER — Encounter: Payer: Self-pay | Admitting: Nurse Practitioner

## 2021-11-09 DIAGNOSIS — A599 Trichomoniasis, unspecified: Secondary | ICD-10-CM

## 2021-11-09 LAB — CHLAMYDIA/GONOCOCCUS/TRICHOMONAS, NAA
Chlamydia by NAA: NEGATIVE
Gonococcus by NAA: NEGATIVE
Trich vag by NAA: POSITIVE — AB

## 2021-11-09 MED ORDER — METRONIDAZOLE 500 MG PO TABS: 2000.0000 mg | ORAL_TABLET | ORAL | 0 refills | Status: AC

## 2021-11-16 ENCOUNTER — Other Ambulatory Visit: Payer: Self-pay

## 2021-11-16 ENCOUNTER — Encounter: Payer: Self-pay | Admitting: Nurse Practitioner

## 2021-11-23 ENCOUNTER — Ambulatory Visit: Payer: BC Managed Care – PPO | Admitting: Nurse Practitioner

## 2021-11-29 ENCOUNTER — Encounter: Payer: Self-pay | Admitting: Nurse Practitioner

## 2021-11-29 ENCOUNTER — Other Ambulatory Visit: Payer: Self-pay | Admitting: Nurse Practitioner

## 2021-11-29 MED ORDER — COLCHICINE 0.6 MG PO TABS: ORAL_TABLET | ORAL | 1 refills | Status: DC

## 2021-12-02 DIAGNOSIS — Z114 Encounter for screening for human immunodeficiency virus [HIV]: Secondary | ICD-10-CM | POA: Diagnosis not present

## 2021-12-02 DIAGNOSIS — Z113 Encounter for screening for infections with a predominantly sexual mode of transmission: Secondary | ICD-10-CM | POA: Diagnosis not present

## 2021-12-02 DIAGNOSIS — Z202 Contact with and (suspected) exposure to infections with a predominantly sexual mode of transmission: Secondary | ICD-10-CM | POA: Diagnosis not present

## 2021-12-08 ENCOUNTER — Encounter (HOSPITAL_COMMUNITY): Payer: Self-pay

## 2021-12-08 ENCOUNTER — Ambulatory Visit (INDEPENDENT_AMBULATORY_CARE_PROVIDER_SITE_OTHER): Payer: BC Managed Care – PPO

## 2021-12-08 ENCOUNTER — Ambulatory Visit (HOSPITAL_COMMUNITY)
Admission: EM | Admit: 2021-12-08 | Discharge: 2021-12-08 | Disposition: A | Discharge status: Home / Self Care | Payer: BC Managed Care – PPO | Attending: Family Medicine | Admitting: Family Medicine

## 2021-12-08 DIAGNOSIS — M25561 Pain in right knee: Secondary | ICD-10-CM

## 2021-12-08 MED ORDER — KETOROLAC TROMETHAMINE 30 MG/ML IJ SOLN
INTRAMUSCULAR | Status: AC
  Filled 2021-12-08: qty 1

## 2021-12-08 MED ORDER — KETOROLAC TROMETHAMINE 30 MG/ML IJ SOLN
30.0000 mg | Freq: Once | INTRAMUSCULAR | Status: AC
Start: 2021-12-08 — End: 2021-12-08
  Administered 2021-12-08: 30 mg via INTRAMUSCULAR

## 2021-12-08 MED ORDER — NAPROXEN 500 MG PO TABS: 500.0000 mg | ORAL_TABLET | Freq: Two times a day (BID) | ORAL | 0 refills | Status: DC | PRN

## 2021-12-08 NOTE — Discharge Instructions (Addendum)
Your x-ray showed some mild arthritis changes, but that may not be what is actually causing your knee pain.  Take naproxen 500 mg--1 tablet every 12 hours as needed for pain; this is a different anti inflammatory from ibuprofen.

## 2021-12-08 NOTE — ED Triage Notes (Signed)
Pt injured right knee going up the steps today at 8:45am. Causing pain and discompfort when walking

## 2021-12-08 NOTE — ED Provider Notes (Signed)
[facility]    CSN: [id number] Arrival date & time: 12/08/21  1301      History   Chief Complaint Chief Complaint  Patient presents with   Knee Injury    HPI Timothy Moore is a 56 y.o. male.   HPI Here for right knee pain first noted this AM. He feels he stepped wrong or twisted it a little when was on stairs carrying furniture.   No popping or locking. Maybe feels swollen to him.   On review of chart, was treated for gout flare at office visit with primary 8/24. Pt states that was the first time he was treated for gout; he has not ever had blood testing for gout.  Past Medical History:  Diagnosis Date   GERD (gastroesophageal reflux disease)    Hypertension     Patient Active Problem List   Diagnosis Date Noted   History of Helicobacter pylori infection    Gastric intestinal metaplasia    Acute gastric erosion    Encounter for screening for upper gastrointestinal disorder    Schatzki's ring    Duodenal nodule    Gastric erythema    Screen for colon cancer    Polyp of sigmoid colon     Past Surgical History:  Procedure Laterality Date   COLONOSCOPY WITH PROPOFOL N/A 05/29/2020   Procedure: COLONOSCOPY WITH PROPOFOL;  Surgeon: [name], MD;  Location: [facility] ENDOSCOPY;  Service: Endoscopy;  Laterality: N/A;   ESOPHAGOGASTRODUODENOSCOPY (EGD) WITH PROPOFOL N/A 05/29/2020   Procedure: ESOPHAGOGASTRODUODENOSCOPY (EGD) WITH PROPOFOL;  Surgeon: [name], MD;  Location: [facility] ENDOSCOPY;  Service: Endoscopy;  Laterality: N/A;   ESOPHAGOGASTRODUODENOSCOPY (EGD) WITH PROPOFOL N/A 03/12/2021   Procedure: ESOPHAGOGASTRODUODENOSCOPY (EGD) WITH PROPOFOL;  Surgeon: [name], MD;  Location: [facility] ENDOSCOPY;  Service: Endoscopy;  Laterality: N/A;   HAND SURGERY Left        Home Medications    Prior to Admission medications   Medication Sig Start Date End Date Taking? Authorizing Provider  naproxen (NAPROSYN) 500 MG tablet Take 1  tablet (500 mg total) by mouth 2 (two) times daily as needed (pain). 12/08/21  Yes Barrett Henle, MD  amLODipine (NORVASC) 5 MG tablet Take 1 tablet (5 mg total) by mouth daily. 09/21/21   Jonetta Osgood, NP  cetirizine (ZYRTEC ALLERGY) 10 MG tablet Take 1 tablet (10 mg total) by mouth daily. 03/23/21   Jonetta Osgood, NP  Multiple Vitamin (MULTIVITAMIN) tablet Take 1 tablet by mouth daily.    [provider]  omeprazole (PRILOSEC) 20 MG capsule Take 1 capsule (20 mg total) by mouth daily. 09/06/21   Jonetta Osgood, NP  sildenafil (VIAGRA) 100 MG tablet TAKE 1/2 TO 1 (ONE-HALF TO ONE) TABLET BY MOUTH ONCE DAILY AS NEEDED FOR ERECTILE DYSFUNCTION 09/21/21   Jonetta Osgood, NP    Family History Family History  Problem Relation Age of Onset   Kidney Stones Mother    Cancer Father    Lupus Sister    Cancer Maternal Grandmother    Cancer Paternal Grandmother     Social History Social History   Tobacco Use   Smoking status: Former    Types: Cigarettes   Smokeless tobacco: Never   Tobacco comments:    quit 11 years ago   Vaping Use   Vaping Use: Never used  Substance Use Topics   Alcohol use: Yes    Comment: 2 shots a month   Drug use: Never    Comment: marijuana 20  years ago     Allergies   Patient has no known allergies.   Review of Systems Review of Systems   Physical Exam Triage Vital Signs ED Triage Vitals  Enc Vitals Group     BP 12/08/21 1406 124/85     Pulse Rate 12/08/21 1406 82     Resp 12/08/21 1406 12     Temp 12/08/21 1406 97.9 F (36.6 C)     Temp Source 12/08/21 1406 Oral     SpO2 12/08/21 1406 98 %     Weight 12/08/21 1404 169 lb (76.7 kg)     Height 12/08/21 1404 '5\' 5"'$  (1.651 m)     Head Circumference --      Peak Flow --      Pain Score 12/08/21 1404 6     Pain Loc --      Pain Edu? --      Excl. in [place]? --    No data found.  Updated Vital Signs BP 124/85 (BP Location: Left Arm)   Pulse 82   Temp 97.9 F (36.6 C)  (Oral)   Resp 12   Ht '5\' 5"'$  (1.651 m)   Wt 76.7 kg   SpO2 98%   BMI 28.12 kg/m   Visual Acuity Right Eye Distance:   Left Eye Distance:   Bilateral Distance:    Right Eye Near:   Left Eye Near:    Bilateral Near:     Physical Exam Vitals reviewed.  Constitutional:      General: He is not in acute distress.    Appearance: He is not ill-appearing, toxic-appearing or diaphoretic.  Musculoskeletal:     Comments: No effusion by exam. ROM nl of knee, but is a little painful.  Skin:    Coloration: Skin is not pale.  Neurological:     Mental Status: He is alert.      UC Treatments / Results  Labs (all labs ordered are listed, but only abnormal results are displayed) Labs Reviewed  BASIC METABOLIC PANEL  URIC ACID    EKG   Radiology DG Knee 2 Views Right  Result Date: 12/08/2021 CLINICAL DATA:  Right knee pain.  Twisting injury. EXAM: RIGHT KNEE - 1-2 VIEW COMPARISON:  None FINDINGS: The joint spaces are maintained. Mild/early degenerative changes. No acute fracture is identified. No definite joint effusion. IMPRESSION: Mild/early degenerative changes but no acute bony findings or joint effusion. Electronically Signed   By: Marijo Sanes M.D.   On: 12/08/2021 14:42    Procedures Procedures (including critical care time)  Medications Ordered in UC Medications  ketorolac (TORADOL) 30 MG/ML injection 30 mg (has no administration in time range)    Initial Impression / Assessment and Plan / UC Course  I have reviewed the triage vital signs and the nursing notes.  Pertinent labs & imaging results that were available during my care of the patient were reviewed by me and considered in my medical decision making (see chart for details).    X-ray shows some mild osteoarthritis, but no acute bony abnormality   I will treat with prescription naproxen and Toradol.  He is given contact information for orthopedics.  We will do a light duty note until October 2. Also we will  do some lab for uric acid and BMP Final diagnoses:  Acute pain of right knee     Discharge Instructions      Your x-ray showed some mild arthritis changes, but that may not be what is  actually causing your knee pain.  Take naproxen 500 mg--1 tablet every 12 hours as needed for pain; this is a different anti inflammatory from ibuprofen.      ED Prescriptions     Medication Sig Dispense Auth. Provider   naproxen (NAPROSYN) 500 MG tablet Take 1 tablet (500 mg total) by mouth 2 (two) times daily as needed (pain). 30 tablet Shonika Kolasinski, Gwenlyn Perking, MD      PDMP not reviewed this encounter.   Barrett Henle, MD 12/08/21 [phone]

## 2021-12-13 ENCOUNTER — Ambulatory Visit: Payer: Self-pay | Admitting: Nurse Practitioner

## 2021-12-13 DIAGNOSIS — Z113 Encounter for screening for infections with a predominantly sexual mode of transmission: Secondary | ICD-10-CM

## 2021-12-13 DIAGNOSIS — I10 Essential (primary) hypertension: Secondary | ICD-10-CM | POA: Insufficient documentation

## 2021-12-13 DIAGNOSIS — Z202 Contact with and (suspected) exposure to infections with a predominantly sexual mode of transmission: Secondary | ICD-10-CM

## 2021-12-13 MED ORDER — METRONIDAZOLE 500 MG PO TABS: 500.0000 mg | ORAL_TABLET | Freq: Two times a day (BID) | ORAL | 0 refills | Status: AC

## 2021-12-13 NOTE — Progress Notes (Signed)
[facility] Department STI clinic/screening visit  Subjective:  Timothy Moore is a 56 y.o. male being seen today for an STI screening visit. The patient reports they do not have symptoms.    Patient has the following medical conditions:   Patient Active Problem List   Diagnosis Date Noted   HTN (hypertension) 52/77/8242   History of Helicobacter pylori infection    Gastric intestinal metaplasia    Acute gastric erosion    Encounter for screening for upper gastrointestinal disorder    Schatzki's ring    Duodenal nodule    Gastric erythema    Screen for colon cancer    Polyp of sigmoid colon      Chief Complaint  Patient presents with   Timothy Moore in contact with someone who had Trich.    HPI  Patient reports to clinic today for STD screening.  Patient reports that he is asymptomatic and also a contact to [place].   Does the patient or their partner desires a pregnancy in the next year? No  Screening for MPX risk: Does the patient have an unexplained rash? No Is the patient MSM? No Does the patient endorse multiple sex partners or anonymous sex partners? Yes Did the patient have close or sexual contact with a person diagnosed with MPX? No Has the patient traveled outside the [place] where MPX is endemic? No Is there a high clinical suspicion for MPX-- evidenced by one of the following No  -Unlikely to be chickenpox  -Lymphadenopathy  -Rash that present in same phase of evolution on any given body part   See flowsheet for further details and programmatic requirements.   Immunization History  Administered Date(s) Administered   [employer] Vaccination 05/06/2020, 06/03/2020   Tdap 07/23/2019     The following portions of the patient's history were reviewed and updated as appropriate: allergies, current medications, past medical history, past social history, past surgical history and problem list.  Objective:  There were no  vitals filed for this visit.  Physical Exam Constitutional:      Appearance: Normal appearance.  HENT:     Head: Normocephalic. No abrasion, masses or laceration. Hair is normal.     Right Ear: External ear normal.     Left Ear: External ear normal.     Nose: Nose normal.     Mouth/Throat:     Lips: Pink.     Mouth: Mucous membranes are moist. No oral lesions.     Pharynx: No pharyngeal swelling, oropharyngeal exudate, posterior oropharyngeal erythema or uvula swelling.     Tonsils: No tonsillar exudate or tonsillar abscesses.     Comments: Top/bottom dentures  Eyes:     General: Lids are normal.        Right eye: No discharge.        Left eye: No discharge.     Conjunctiva/sclera: Conjunctivae normal.     Right eye: No exudate.    Left eye: No exudate. Abdominal:     General: Abdomen is flat.     Palpations: Abdomen is soft.     Tenderness: There is no abdominal tenderness. There is no rebound.  Genitourinary:    Pubic Area: No rash or pubic lice.      Penis: Normal and circumcised. No erythema or discharge.      Testes: Normal.        Right: Mass or tenderness not present.        Left: Mass  or tenderness not present.     Rectum: Normal.     Comments: Discharge amount: none Color:none  Musculoskeletal:     Cervical back: Full passive range of motion without pain, normal range of motion and neck supple.  Lymphadenopathy:     Cervical: No cervical adenopathy.     Right cervical: No superficial, deep or posterior cervical adenopathy.    Left cervical: No superficial, deep or posterior cervical adenopathy.     Upper Body:     Right upper body: No supraclavicular, axillary or epitrochlear adenopathy.     Left upper body: No supraclavicular, axillary or epitrochlear adenopathy.     Lower Body: No right inguinal adenopathy. No left inguinal adenopathy.  Skin:    General: Skin is warm and dry.     Findings: No lesion or rash.  Neurological:     Mental Status: He is alert  and oriented to person, place, and time.  Psychiatric:        Attention and Perception: Attention normal.        Mood and Affect: Mood normal.        Speech: Speech normal.        Behavior: Behavior normal. Behavior is cooperative.       Assessment and Plan:  [name] is a 56 y.o. male presenting to the [facility] Department for STI screening  1. Screening examination for venereal disease -56 year old male in clinic today for STD screening. -Patient does not have STI symptoms Patient accepted all screenings including oral GC, urine CT/GC and declines bloodwork for HIV/RPR.  Patient meets criteria for HepB screening? Yes. Ordered? No - refused Patient meets criteria for HepC screening? Yes. Ordered? No - refused Recommended condom use with all sex Discussed importance of condom use for STI prevent   Discussed time line for State Lab results and that patient will be called with positive results and encouraged patient to call if he had not heard in 2 weeks Recommended returning for continued or worsening symptoms.    - Chlamydia/GC NAA, Confirmation - Gonococcus culture  2. Exposure to trichomonas -Patient reports being a contact to Trich.   Patent advised not to have sex for 7 days and 7 days after partner is treated.    - metroNIDAZOLE (FLAGYL) 500 MG tablet; Take 1 tablet (500 mg total) by mouth 2 (two) times daily for 7 days.  Dispense: 14 tablet; Refill: 0  Total time spent: 30 minutes    Return if symptoms worsen or fail to improve.  Future Appointments  Date Time Provider [place]  12/22/2021  4:00 PM Jonetta Osgood, NP [facility] None  03/28/2022  2:00 PM Jonetta Osgood, NP [facility] None    Gregary Cromer, FNP

## 2021-12-17 LAB — CHLAMYDIA/GC NAA, CONFIRMATION
Chlamydia trachomatis, NAA: NEGATIVE
Neisseria gonorrhoeae, NAA: NEGATIVE

## 2021-12-17 LAB — GONOCOCCUS CULTURE

## 2021-12-22 ENCOUNTER — Ambulatory Visit: Payer: BC Managed Care – PPO | Admitting: Nurse Practitioner

## 2021-12-22 ENCOUNTER — Encounter: Payer: Self-pay | Admitting: Nurse Practitioner

## 2021-12-22 VITALS — BP 135/80 | HR 90 | Temp 98.1°F | Resp 16 | Ht 65.0 in | Wt 169.4 lb

## 2021-12-22 DIAGNOSIS — Z760 Encounter for issue of repeat prescription: Secondary | ICD-10-CM

## 2021-12-22 DIAGNOSIS — N528 Other male erectile dysfunction: Secondary | ICD-10-CM

## 2021-12-22 DIAGNOSIS — I10 Essential (primary) hypertension: Secondary | ICD-10-CM | POA: Diagnosis not present

## 2021-12-22 MED ORDER — CETIRIZINE HCL 10 MG PO TABS: 10.0000 mg | ORAL_TABLET | Freq: Every day | ORAL | 1 refills | Status: DC

## 2021-12-22 MED ORDER — AMLODIPINE BESYLATE 5 MG PO TABS: 5.0000 mg | ORAL_TABLET | Freq: Every day | ORAL | 1 refills | Status: DC

## 2021-12-22 MED ORDER — OMEPRAZOLE 20 MG PO CPDR: 20.0000 mg | DELAYED_RELEASE_CAPSULE | Freq: Every day | ORAL | 1 refills | Status: DC

## 2021-12-22 MED ORDER — SILDENAFIL CITRATE 100 MG PO TABS: ORAL_TABLET | ORAL | 2 refills | Status: DC

## 2021-12-22 NOTE — Progress Notes (Signed)
[facility] [place], [place] [address]  Internal MEDICINE  Office Visit Note  Patient Name: Timothy Moore  [id number]  [id number]  Date of Service: 12/22/2021  Chief Complaint  Patient presents with   Follow-up   Gastroesophageal Reflux   Hypertension    HPI Timothy Moore presents for a follow up visit for hypertension, refills and GERD.  Hypertension -- BP controlled with current medication GERD -- takes omeprazole, no issues, acid reflux well controlled.  Need refills - sildenafil, current dose is effective    Current Medication: Outpatient Encounter Medications as of 12/22/2021  Medication Sig   Multiple Vitamin (MULTIVITAMIN) tablet Take 1 tablet by mouth daily.   naproxen (NAPROSYN) 500 MG tablet Take 1 tablet (500 mg total) by mouth 2 (two) times daily as needed (pain).   [DISCONTINUED] amLODipine (NORVASC) 5 MG tablet Take 1 tablet (5 mg total) by mouth daily.   [DISCONTINUED] cetirizine (ZYRTEC ALLERGY) 10 MG tablet Take 1 tablet (10 mg total) by mouth daily.   [DISCONTINUED] omeprazole (PRILOSEC) 20 MG capsule Take 1 capsule (20 mg total) by mouth daily.   [DISCONTINUED] sildenafil (VIAGRA) 100 MG tablet TAKE 1/2 TO 1 (ONE-HALF TO ONE) TABLET BY MOUTH ONCE DAILY AS NEEDED FOR ERECTILE DYSFUNCTION   amLODipine (NORVASC) 5 MG tablet Take 1 tablet (5 mg total) by mouth daily.   cetirizine (ZYRTEC ALLERGY) 10 MG tablet Take 1 tablet (10 mg total) by mouth daily.   omeprazole (PRILOSEC) 20 MG capsule Take 1 capsule (20 mg total) by mouth daily.   sildenafil (VIAGRA) 100 MG tablet TAKE 1/2 TO 1 (ONE-HALF TO ONE) TABLET BY MOUTH ONCE DAILY AS NEEDED FOR ERECTILE DYSFUNCTION   No facility-administered encounter medications on file as of 12/22/2021.    Surgical History: Past Surgical History:  Procedure Laterality Date   COLONOSCOPY WITH PROPOFOL N/A 05/29/2020   Procedure: COLONOSCOPY WITH PROPOFOL;  Surgeon: [name], MD;  Location: [facility]  ENDOSCOPY;  Service: Endoscopy;  Laterality: N/A;   ESOPHAGOGASTRODUODENOSCOPY (EGD) WITH PROPOFOL N/A 05/29/2020   Procedure: ESOPHAGOGASTRODUODENOSCOPY (EGD) WITH PROPOFOL;  Surgeon: [name], MD;  Location: [facility] ENDOSCOPY;  Service: Endoscopy;  Laterality: N/A;   ESOPHAGOGASTRODUODENOSCOPY (EGD) WITH PROPOFOL N/A 03/12/2021   Procedure: ESOPHAGOGASTRODUODENOSCOPY (EGD) WITH PROPOFOL;  Surgeon: [name], MD;  Location: [facility] ENDOSCOPY;  Service: Endoscopy;  Laterality: N/A;   HAND SURGERY Left     Medical History: Past Medical History:  Diagnosis Date   GERD (gastroesophageal reflux disease)    Hypertension     Family History: Family History  Problem Relation Age of Onset   Kidney Stones Mother    Cancer Father    Lupus Sister    Cancer Maternal Grandmother    Cancer Paternal Grandmother     Social History   Socioeconomic History   Marital status: Single    Spouse name: Not on file   Number of children: Not on file   Years of education: Not on file   Highest education level: Not on file  Occupational History   Not on file  Tobacco Use   Smoking status: Former    Types: Cigarettes    Quit date: 03/14/2009    Years since quitting: 12.7   Smokeless tobacco: Never   Tobacco comments:    quit 11 years ago   Vaping Use   Vaping Use: Never used  Substance and Sexual Activity   Alcohol use: Yes    Comment: 2 shots a month   Drug  use: Never    Comment: marijuana 20 years ago   Sexual activity: Yes    Birth control/protection: Condom  Other Topics Concern   Not on file  Social History Narrative   Not on file   Social Determinants of Health   Financial Resource Strain: Not on file  Food Insecurity: Not on file  Transportation Needs: Not on file  Physical Activity: Not on file  Stress: Not on file  Social Connections: Not on file  Intimate Partner Violence: Not At Risk (12/13/2021)   Humiliation, Afraid, Rape, and Kick questionnaire    Fear  of Current or Ex-Partner: No    Emotionally Abused: No    Physically Abused: No    Sexually Abused: No      Review of Systems  Constitutional:  Negative for chills, fatigue and unexpected weight change.  HENT:  Negative for congestion, rhinorrhea, sneezing and sore throat.   Eyes:  Negative for redness.  Respiratory:  Negative for cough, chest tightness and shortness of breath.   Cardiovascular:  Negative for chest pain and palpitations.  Gastrointestinal:  Negative for abdominal pain, constipation, diarrhea, nausea and vomiting.  Genitourinary:  Negative for dysuria and frequency.  Musculoskeletal:  Negative for arthralgias, back pain, joint swelling and neck pain.  Skin:  Negative for rash.  Neurological: Negative.  Negative for tremors and numbness.  Hematological:  Negative for adenopathy. Does not bruise/bleed easily.  Psychiatric/Behavioral:  Negative for behavioral problems (Depression), sleep disturbance and suicidal ideas. The patient is not nervous/anxious.     Vital Signs: BP 135/80   Pulse 90   Temp 98.1 F (36.7 C)   Resp 16   Ht '5\' 5"'$  (1.651 m)   Wt 169 lb 6.4 oz (76.8 kg)   SpO2 97%   BMI 28.19 kg/m    Physical Exam Vitals reviewed.  Constitutional:      General: He is not in acute distress.    Appearance: Normal appearance. He is not ill-appearing.  HENT:     Head: Normocephalic and atraumatic.  Eyes:     Pupils: Pupils are equal, round, and reactive to light.  Cardiovascular:     Rate and Rhythm: Normal rate and regular rhythm.  Pulmonary:     Effort: Pulmonary effort is normal. No respiratory distress.  Neurological:     Mental Status: He is alert and oriented to person, place, and time.  Psychiatric:        Mood and Affect: Mood normal.        Behavior: Behavior normal.       Assessment/Plan: 1. Essential hypertension Stable, continue amlodipine as prescribed.  - amLODipine (NORVASC) 5 MG tablet; Take 1 tablet (5 mg total) by mouth  daily.  Dispense: 90 tablet; Refill: 1  2. Other male erectile dysfunction Does well with sildenafil, refills ordered, continue as prescribed - sildenafil (VIAGRA) 100 MG tablet; TAKE 1/2 TO 1 (ONE-HALF TO ONE) TABLET BY MOUTH ONCE DAILY AS NEEDED FOR ERECTILE DYSFUNCTION  Dispense: 15 tablet; Refill: 2  3. Medication refill - omeprazole (PRILOSEC) 20 MG capsule; Take 1 capsule (20 mg total) by mouth daily.  Dispense: 90 capsule; Refill: 1 - cetirizine (ZYRTEC ALLERGY) 10 MG tablet; Take 1 tablet (10 mg total) by mouth daily.  Dispense: 90 tablet; Refill: 1 - amLODipine (NORVASC) 5 MG tablet; Take 1 tablet (5 mg total) by mouth daily.  Dispense: 90 tablet; Refill: 1   General Counseling: makoto sellitto understanding of the findings of todays visit and agrees  with plan of treatment. I have discussed any further diagnostic evaluation that may be needed or ordered today. We also reviewed his medications today. he has been encouraged to call the office with any questions or concerns that should arise related to todays visit.    No orders of the defined types were placed in this encounter.   Meds ordered this encounter  Medications   omeprazole (PRILOSEC) 20 MG capsule    Sig: Take 1 capsule (20 mg total) by mouth daily.    Dispense:  90 capsule    Refill:  1   cetirizine (ZYRTEC ALLERGY) 10 MG tablet    Sig: Take 1 tablet (10 mg total) by mouth daily.    Dispense:  90 tablet    Refill:  1   amLODipine (NORVASC) 5 MG tablet    Sig: Take 1 tablet (5 mg total) by mouth daily.    Dispense:  90 tablet    Refill:  1   sildenafil (VIAGRA) 100 MG tablet    Sig: TAKE 1/2 TO 1 (ONE-HALF TO ONE) TABLET BY MOUTH ONCE DAILY AS NEEDED FOR ERECTILE DYSFUNCTION    Dispense:  15 tablet    Refill:  2    Return in about 2 months (around 02/21/2022) for F/U , BP check, Lorris Carducci PCP.   Total time spent:30 Minutes Time spent includes review of chart, medications, test results, and follow up plan  with the patient.    Controlled Substance Database was reviewed by me.  This patient was seen by Jonetta Osgood, FNP-C in collaboration with Dr. Clayborn Bigness as a part of collaborative care agreement.   Eleora Sutherland R. Valetta Fuller, MSN, FNP-C Internal medicine

## 2021-12-30 NOTE — Addendum Note (Signed)
Addended by: [employer]is [employer] on: 12/30/2021 02:09 PM   Modules accepted: Orders

## 2022-01-02 ENCOUNTER — Encounter: Payer: Self-pay | Admitting: Nurse Practitioner

## 2022-01-29 ENCOUNTER — Encounter: Payer: Self-pay | Admitting: Nurse Practitioner

## 2022-02-17 ENCOUNTER — Encounter: Payer: Self-pay | Admitting: Nurse Practitioner

## 2022-02-18 ENCOUNTER — Ambulatory Visit (INDEPENDENT_AMBULATORY_CARE_PROVIDER_SITE_OTHER): Payer: BC Managed Care – PPO | Admitting: Nurse Practitioner

## 2022-02-18 ENCOUNTER — Encounter: Payer: Self-pay | Admitting: Nurse Practitioner

## 2022-02-18 ENCOUNTER — Telehealth: Payer: Self-pay | Admitting: Nurse Practitioner

## 2022-02-18 VITALS — BP 136/87 | HR 75 | Temp 98.2°F | Resp 16 | Ht 65.0 in | Wt 175.2 lb

## 2022-02-18 DIAGNOSIS — N528 Other male erectile dysfunction: Secondary | ICD-10-CM

## 2022-02-18 DIAGNOSIS — M5442 Lumbago with sciatica, left side: Secondary | ICD-10-CM

## 2022-02-18 DIAGNOSIS — I10 Essential (primary) hypertension: Secondary | ICD-10-CM

## 2022-02-18 DIAGNOSIS — M5441 Lumbago with sciatica, right side: Secondary | ICD-10-CM

## 2022-02-18 MED ORDER — TRAMADOL HCL 50 MG PO TABS: 50.0000 mg | ORAL_TABLET | Freq: Four times a day (QID) | ORAL | 0 refills | Status: DC | PRN

## 2022-02-18 MED ORDER — SILDENAFIL CITRATE 100 MG PO TABS
ORAL_TABLET | ORAL | 2 refills | Status: DC
Start: 2022-02-18 — End: 2022-03-28

## 2022-02-18 NOTE — Progress Notes (Signed)
[facility] [place], [place] [address]  Internal MEDICINE  Office Visit Note  Patient Name: Timothy Moore  [id number]  [id number]  Date of Service: 02/18/2022  Chief Complaint  Patient presents with   Follow-up    F/u bp   Gastroesophageal Reflux   Hypertension    HPI Devrin presents for a follow up visit for hypertension, back pain and sciatica.  Hypertension -- stable, continue medications as prescribed.  Low back pain with sciatica -- reports his "back went out" this week. Took an old tramadol he found from last year which helped. Reports alternating burning sharp pain radiating down both legs.  Need refill of sildenafil     Current Medication: Outpatient Encounter Medications as of 02/18/2022  Medication Sig   amLODipine (NORVASC) 5 MG tablet Take 1 tablet (5 mg total) by mouth daily.   cetirizine (ZYRTEC ALLERGY) 10 MG tablet Take 1 tablet (10 mg total) by mouth daily.   Multiple Vitamin (MULTIVITAMIN) tablet Take 1 tablet by mouth daily.   naproxen (NAPROSYN) 500 MG tablet Take 1 tablet (500 mg total) by mouth 2 (two) times daily as needed (pain).   omeprazole (PRILOSEC) 20 MG capsule Take 1 capsule (20 mg total) by mouth daily.   traMADol (ULTRAM) 50 MG tablet Take 1-2 tablets (50-100 mg total) by mouth every 6 (six) hours as needed for up to 5 days.   [DISCONTINUED] sildenafil (VIAGRA) 100 MG tablet TAKE 1/2 TO 1 (ONE-HALF TO ONE) TABLET BY MOUTH ONCE DAILY AS NEEDED FOR ERECTILE DYSFUNCTION   sildenafil (VIAGRA) 100 MG tablet TAKE 1/2 TO 1 (ONE-HALF TO ONE) TABLET BY MOUTH ONCE DAILY AS NEEDED FOR ERECTILE DYSFUNCTION   No facility-administered encounter medications on file as of 02/18/2022.    Surgical History: Past Surgical History:  Procedure Laterality Date   COLONOSCOPY WITH PROPOFOL N/A 05/29/2020   Procedure: COLONOSCOPY WITH PROPOFOL;  Surgeon: [name], MD;  Location: [facility];  Service: Endoscopy;  Laterality:  N/A;   ESOPHAGOGASTRODUODENOSCOPY (EGD) WITH PROPOFOL N/A 05/29/2020   Procedure: ESOPHAGOGASTRODUODENOSCOPY (EGD) WITH PROPOFOL;  Surgeon: [name], MD;  Location: [facility];  Service: Endoscopy;  Laterality: N/A;   ESOPHAGOGASTRODUODENOSCOPY (EGD) WITH PROPOFOL N/A 03/12/2021   Procedure: ESOPHAGOGASTRODUODENOSCOPY (EGD) WITH PROPOFOL;  Surgeon: [name], MD;  Location: [facility];  Service: Endoscopy;  Laterality: N/A;   HAND SURGERY Left     Medical History: Past Medical History:  Diagnosis Date   GERD (gastroesophageal reflux disease)    Hypertension     Family History: Family History  Problem Relation Age of Onset   Kidney Stones Mother    Cancer Father    Lupus Sister    Cancer Maternal Grandmother    Cancer Paternal Grandmother     Social History   Socioeconomic History   Marital status: Single    Spouse name: Not on file   Number of children: Not on file   Years of education: Not on file   Highest education level: Not on file  Occupational History   Not on file  Tobacco Use   Smoking status: Former    Types: Cigarettes    Quit date: 03/14/2009    Years since quitting: 12.9   Smokeless tobacco: Never   Tobacco comments:    quit 11 years ago   Vaping Use   Vaping Use: Never used  Substance and Sexual Activity   Alcohol use: Yes    Comment: 2 shots a month  Drug use: Never    Comment: marijuana 20 years ago   Sexual activity: Yes    Birth control/protection: Condom  Other Topics Concern   Not on file  Social History Narrative   Not on file   Social Determinants of Health   Financial Resource Strain: Not on file  Food Insecurity: Not on file  Transportation Needs: Not on file  Physical Activity: Not on file  Stress: Not on file  Social Connections: Not on file  Intimate Partner Violence: Not At Risk (12/13/2021)   Humiliation, Afraid, Rape, and Kick questionnaire    Fear of Current or Ex-Partner: No    Emotionally  Abused: No    Physically Abused: No    Sexually Abused: No      Review of Systems  Constitutional:  Negative for chills, fatigue and unexpected weight change.  HENT:  Negative for congestion, rhinorrhea, sneezing and sore throat.   Eyes:  Negative for redness.  Respiratory:  Negative for cough, chest tightness and shortness of breath.   Cardiovascular:  Negative for chest pain and palpitations.  Gastrointestinal:  Negative for abdominal pain, constipation, diarrhea, nausea and vomiting.  Genitourinary:  Negative for dysuria and frequency.  Musculoskeletal:  Negative for arthralgias, back pain, joint swelling and neck pain.  Skin:  Negative for rash.  Neurological: Negative.  Negative for tremors and numbness.  Hematological:  Negative for adenopathy. Does not bruise/bleed easily.  Psychiatric/Behavioral:  Negative for behavioral problems (Depression), sleep disturbance and suicidal ideas. The patient is not nervous/anxious.     Vital Signs: BP 136/87   Pulse 75   Temp 98.2 F (36.8 C)   Resp 16   Ht '5\' 5"'$  (1.651 m)   Wt 175 lb 3.2 oz (79.5 kg)   SpO2 96%   BMI 29.15 kg/m    Physical Exam Vitals reviewed.  Constitutional:      General: He is not in acute distress.    Appearance: Normal appearance. He is not ill-appearing.  HENT:     Head: Normocephalic and atraumatic.  Eyes:     Pupils: Pupils are equal, round, and reactive to light.  Cardiovascular:     Rate and Rhythm: Normal rate and regular rhythm.  Pulmonary:     Effort: Pulmonary effort is normal. No respiratory distress.  Neurological:     Mental Status: He is alert and oriented to person, place, and time.  Psychiatric:        Mood and Affect: Mood normal.        Behavior: Behavior normal.        Assessment/Plan: 1. Essential hypertension Stable, continue amlodipine as prescribed  2. Acute midline low back pain with bilateral sciatica Tramadol prescribed, recommended patient to take only 1 tablet  during the day but may take 2 tablets at bedtime.  - traMADol (ULTRAM) 50 MG tablet; Take 1-2 tablets (50-100 mg total) by mouth every 6 (six) hours as needed for up to 5 days.  Dispense: 40 tablet; Refill: 0  3. Other male erectile dysfunction Effective, refills ordered - sildenafil (VIAGRA) 100 MG tablet; TAKE 1/2 TO 1 (ONE-HALF TO ONE) TABLET BY MOUTH ONCE DAILY AS NEEDED FOR ERECTILE DYSFUNCTION  Dispense: 15 tablet; Refill: 2   General Counseling: lynell kussman understanding of the findings of todays visit and agrees with plan of treatment. I have discussed any further diagnostic evaluation that may be needed or ordered today. We also reviewed his medications today. he has been encouraged to call the office with  any questions or concerns that should arise related to todays visit.    No orders of the defined types were placed in this encounter.   Meds ordered this encounter  Medications   sildenafil (VIAGRA) 100 MG tablet    Sig: TAKE 1/2 TO 1 (ONE-HALF TO ONE) TABLET BY MOUTH ONCE DAILY AS NEEDED FOR ERECTILE DYSFUNCTION    Dispense:  15 tablet    Refill:  2   traMADol (ULTRAM) 50 MG tablet    Sig: Take 1-2 tablets (50-100 mg total) by mouth every 6 (six) hours as needed for up to 5 days.    Dispense:  40 tablet    Refill:  0    Fill for 5 day supply.    Return in about 2 months (around 04/21/2022) for F/U, BP check, Zaydrian Batta PCP.   Total time spent:30 Minutes Time spent includes review of chart, medications, test results, and follow up plan with the patient.   [place] Controlled Substance Database was reviewed by me.  This patient was seen by Jonetta Osgood, FNP-C in collaboration with Dr. Clayborn Bigness as a part of collaborative care agreement.   Marshia Tropea R. Valetta Fuller, MSN, FNP-C Internal medicine

## 2022-02-20 MED ORDER — TRAMADOL HCL 50 MG PO TABS: 50.0000 mg | ORAL_TABLET | Freq: Four times a day (QID) | ORAL | 0 refills | Status: AC | PRN

## 2022-02-20 NOTE — Addendum Note (Signed)
Addended by: Jonetta Osgood on: 02/20/2022 06:07 AM   Modules accepted: Orders

## 2022-02-21 ENCOUNTER — Ambulatory Visit: Payer: BC Managed Care – PPO | Admitting: Nurse Practitioner

## 2022-02-21 ENCOUNTER — Other Ambulatory Visit: Payer: Self-pay

## 2022-02-21 NOTE — Telephone Encounter (Signed)
Spoke  with phar and as per alyssa change direction for tramadol 1 tab po every 6 hrs as needed

## 2022-03-04 ENCOUNTER — Ambulatory Visit
Admission: EM | Admit: 2022-03-04 | Discharge: 2022-03-04 | Disposition: A | Discharge status: Home / Self Care | Payer: BC Managed Care – PPO | Attending: Family Medicine | Admitting: Family Medicine

## 2022-03-04 DIAGNOSIS — R1084 Generalized abdominal pain: Secondary | ICD-10-CM | POA: Diagnosis not present

## 2022-03-04 DIAGNOSIS — Z202 Contact with and (suspected) exposure to infections with a predominantly sexual mode of transmission: Secondary | ICD-10-CM | POA: Diagnosis not present

## 2022-03-04 DIAGNOSIS — R197 Diarrhea, unspecified: Secondary | ICD-10-CM

## 2022-03-04 DIAGNOSIS — N451 Epididymitis: Secondary | ICD-10-CM | POA: Diagnosis not present

## 2022-03-04 LAB — COMPREHENSIVE METABOLIC PANEL
ALT: 25 U/L (ref 0–44)
AST: 36 U/L (ref 15–41)
Albumin: 4.3 g/dL (ref 3.5–5.0)
Alkaline Phosphatase: 88 U/L (ref 38–126)
Anion gap: 9 (ref 5–15)
BUN: 15 mg/dL (ref 6–20)
CO2: 25 mmol/L (ref 22–32)
Calcium: 9.1 mg/dL (ref 8.9–10.3)
Chloride: 102 mmol/L (ref 98–111)
Creatinine, Ser: 1.27 mg/dL — ABNORMAL HIGH (ref 0.61–1.24)
GFR, Estimated: >60 mL/min (ref >60)
Glucose, Bld: 95 mg/dL (ref 70–99)
Potassium: 3.9 mmol/L (ref 3.5–5.1)
Sodium: 136 mmol/L (ref 135–145)
Total Bilirubin: 0.6 mg/dL (ref 0.3–1.2)
Total Protein: 7.7 g/dL (ref 6.5–8.1)

## 2022-03-04 LAB — URINALYSIS, ROUTINE W REFLEX MICROSCOPIC
Bilirubin Urine: NEGATIVE
Glucose, UA: NEGATIVE mg/dL
Hgb urine dipstick: NEGATIVE
Ketones, ur: NEGATIVE mg/dL
Leukocytes,Ua: NEGATIVE
Nitrite: NEGATIVE
Protein, ur: NEGATIVE mg/dL
Specific Gravity, Urine: 1.02 (ref 1.005–1.030)
pH: 6 (ref 5.0–8.0)

## 2022-03-04 LAB — CBC WITH DIFFERENTIAL/PLATELET
Abs Immature Granulocytes: 0.04 10*3/uL (ref 0.00–0.07)
Basophils Absolute: 0 10*3/uL (ref 0.0–0.1)
Basophils Relative: 0 %
Eosinophils Absolute: 0 10*3/uL (ref 0.0–0.5)
Eosinophils Relative: 0 %
HCT: 41.9 % (ref 39.0–52.0)
Hemoglobin: 14.4 g/dL (ref 13.0–17.0)
Immature Granulocytes: 1 %
Lymphocytes Relative: 9 %
Lymphs Abs: 0.7 10*3/uL (ref 0.7–4.0)
MCH: 29.1 pg (ref 26.0–34.0)
MCHC: 34.4 g/dL (ref 30.0–36.0)
MCV: 84.6 fL (ref 80.0–100.0)
Monocytes Absolute: 0.5 10*3/uL (ref 0.1–1.0)
Monocytes Relative: 6 %
Neutro Abs: 6.3 10*3/uL (ref 1.7–7.7)
Neutrophils Relative %: 84 %
Platelets: 258 10*3/uL (ref 150–400)
RBC: 4.95 MIL/uL (ref 4.22–5.81)
RDW: 14.1 % (ref 11.5–15.5)
WBC: 7.5 10*3/uL (ref 4.0–10.5)
nRBC: 0 % (ref 0.0–0.2)

## 2022-03-04 LAB — LIPASE, BLOOD: Lipase: 40 U/L (ref 11–51)

## 2022-03-04 MED ORDER — LOPERAMIDE HCL 2 MG PO CAPS
2.0000 mg | ORAL_CAPSULE | Freq: Four times a day (QID) | ORAL | 0 refills | Status: DC | PRN
Start: 2022-03-04 — End: 2023-02-06

## 2022-03-04 MED ORDER — AZITHROMYCIN 250 MG PO TABS
250.0000 mg | ORAL_TABLET | Freq: Every day | ORAL | Status: DC
  Administered 2022-03-04: 250 mg via ORAL

## 2022-03-04 MED ORDER — SODIUM CHLORIDE 0.9 % IV BOLUS
1000.0000 mL | Freq: Once | INTRAVENOUS | Status: AC
  Administered 2022-03-04: 1000 mL via INTRAVENOUS

## 2022-03-04 MED ORDER — CEFTRIAXONE SODIUM 500 MG IJ SOLR
500.0000 mg | Freq: Once | INTRAMUSCULAR | Status: AC
Start: 2022-03-04 — End: 2022-03-04
  Administered 2022-03-04: 500 mg via INTRAMUSCULAR

## 2022-03-04 MED ORDER — AZITHROMYCIN 500 MG PO TABS: 1000.0000 mg | ORAL_TABLET | Freq: Once | ORAL | Status: DC

## 2022-03-04 NOTE — Discharge Instructions (Addendum)
Your blood and urine testing were normal.  I suspect your diarrhea may be food or virus related.  Your symptoms should improve over the next few days.  We gave you a medication called loperamide to take up to 4 times daily as needed for diarrhea.  Do not use any more MiraLAX at this can make diarrhea worse.    You were given treatment for STD's that could cause testicular pain tonight.

## 2022-03-04 NOTE — ED Notes (Signed)
So far Patient unable to urinate to collect sample. Patient has been given water.

## 2022-03-04 NOTE — ED Provider Notes (Signed)
[facility]    CSN: [id number] Arrival date & time: 03/04/22  1527      History   Chief Complaint Chief Complaint  Patient presents with   Diarrhea   Testicle Pain    HPI Timothy Moore is a 56 y.o. male.   HPI  Timothy Moore presents for diarrhea with bloating and cramp-like abdominal pain.   Has had 8-10 watery non-bloody stools today. He ate [employer] last night then later that night the diarrhea started.  He was at break at work so no one else had similar food.  He has not had any vomiting or blood in his stool.  He has not been around anybody who has been sick.  Denies all respiratory symptoms.  Has testicular pain that started today. No rash or swelling of testicles.  They don't hurt when touched.  No dysuria, hematuria     Past Medical History:  Diagnosis Date   GERD (gastroesophageal reflux disease)    Hypertension     Patient Active Problem List   Diagnosis Date Noted   HTN (hypertension) 78/29/5621   History of Helicobacter pylori infection    Gastric intestinal metaplasia    Acute gastric erosion    Encounter for screening for upper gastrointestinal disorder    Schatzki's ring    Duodenal nodule    Gastric erythema    Screen for colon cancer    Polyp of sigmoid colon     Past Surgical History:  Procedure Laterality Date   COLONOSCOPY WITH PROPOFOL N/A 05/29/2020   Procedure: COLONOSCOPY WITH PROPOFOL;  Surgeon: [name], MD;  Location: [facility];  Service: Endoscopy;  Laterality: N/A;   ESOPHAGOGASTRODUODENOSCOPY (EGD) WITH PROPOFOL N/A 05/29/2020   Procedure: ESOPHAGOGASTRODUODENOSCOPY (EGD) WITH PROPOFOL;  Surgeon: [name], MD;  Location: [facility];  Service: Endoscopy;  Laterality: N/A;   ESOPHAGOGASTRODUODENOSCOPY (EGD) WITH PROPOFOL N/A 03/12/2021   Procedure: ESOPHAGOGASTRODUODENOSCOPY (EGD) WITH PROPOFOL;  Surgeon: [name], MD;  Location: [facility];  Service: Endoscopy;  Laterality: N/A;    HAND SURGERY Left        Home Medications    Prior to Admission medications   Medication Sig Start Date End Date Taking? Authorizing Provider  amLODipine (NORVASC) 5 MG tablet Take 1 tablet (5 mg total) by mouth daily. 12/22/21  Yes Abernathy, Yetta Flock, NP  loperamide (IMODIUM) 2 MG capsule Take 1 capsule (2 mg total) by mouth 4 (four) times daily as needed for diarrhea or loose stools. 03/04/22  Yes Brayla Pat, DO  omeprazole (PRILOSEC) 20 MG capsule Take 1 capsule (20 mg total) by mouth daily. 12/22/21  Yes Abernathy, Alyssa, NP  sildenafil (VIAGRA) 100 MG tablet TAKE 1/2 TO 1 (ONE-HALF TO ONE) TABLET BY MOUTH ONCE DAILY AS NEEDED FOR ERECTILE DYSFUNCTION 02/18/22  Yes Abernathy, Yetta Flock, NP  cetirizine (ZYRTEC ALLERGY) 10 MG tablet Take 1 tablet (10 mg total) by mouth daily. 12/22/21   Jonetta Osgood, NP  Multiple Vitamin (MULTIVITAMIN) tablet Take 1 tablet by mouth daily.    [provider]  naproxen (NAPROSYN) 500 MG tablet Take 1 tablet (500 mg total) by mouth 2 (two) times daily as needed (pain). 12/08/21   Barrett Henle, MD    Family History Family History  Problem Relation Age of Onset   Kidney Stones Mother    Cancer Father    Lupus Sister    Cancer Maternal Grandmother    Cancer Paternal Grandmother     Social History Social History   Tobacco  Use   Smoking status: Former    Types: Cigarettes    Quit date: 03/14/2009    Years since quitting: 12.9   Smokeless tobacco: Never   Tobacco comments:    quit 11 years ago   Vaping Use   Vaping Use: Never used  Substance Use Topics   Alcohol use: Yes    Comment: 2 shots a month   Drug use: Never    Comment: marijuana 20 years ago     Allergies   Patient has no known allergies.   Review of Systems Review of Systems :negative unless otherwise stated in HPI.      Physical Exam Triage Vital Signs ED Triage Vitals  Enc Vitals Group     BP 03/04/22 1635 (!) 132/90     Pulse Rate 03/04/22  1635 (!) 114     Resp 03/04/22 1635 16     Temp 03/04/22 1635 98.6 F (37 C)     Temp Source 03/04/22 1635 Oral     SpO2 03/04/22 1635 95 %     Weight 03/04/22 1637 171 lb (77.6 kg)     Height 03/04/22 1637 '5\' 5"'$  (1.651 m)     Head Circumference --      Peak Flow --      Pain Score 03/04/22 1633 10     Pain Loc --      Pain Edu? --      Excl. in [place]? --    No data found.  Updated Vital Signs BP (!) 132/90 (BP Location: Left Arm)   Pulse (!) 114   Temp 98.6 F (37 C) (Oral)   Resp 16   Ht '5\' 5"'$  (1.651 m)   Wt 77.6 kg   SpO2 95%   BMI 28.46 kg/m   Visual Acuity Right Eye Distance:   Left Eye Distance:   Bilateral Distance:    Right Eye Near:   Left Eye Near:    Bilateral Near:     Physical Exam  GEN: non-toxic appearing male, in no acute distress  CV: regular rate rhythm, tachycardic RESP: no increased work of breathing, clear to ascultation bilaterally ABD: Bowel sounds present. Soft, generalized tenderness, non-distended.  No guarding, no rebound, no appreciable hepatosplenomegaly, no CVA tenderness, negative McBurney's, negative Murphy MSK: no extremity edema GU: Normal shaft, no blood or discharge at the urethral meatus, bilateral tenderness at the posterior superior testes, no scrotal edema, no appreciable varicocele, no high riding testicle  SKIN: warm, dry, no rash on visible skin NEURO: alert, moves all extremities appropriately PSYCH: Normal affect, appropriate speech and behavior   UC Treatments / Results  Labs (all labs ordered are listed, but only abnormal results are displayed) Labs Reviewed  COMPREHENSIVE METABOLIC PANEL - Abnormal; Notable for the following components:      Result Value   Creatinine, Ser 1.27 (*)    All other components within normal limits  URINALYSIS, ROUTINE W REFLEX MICROSCOPIC  CBC WITH DIFFERENTIAL/PLATELET  LIPASE, BLOOD    EKG   Radiology No results found.  Procedures Procedures (including critical care  time)  Medications Ordered in UC Medications  azithromycin (ZITHROMAX) tablet 250 mg (250 mg Oral Given 03/04/22 1935)  azithromycin (ZITHROMAX) tablet 250 mg (250 mg Oral Given 03/04/22 1935)  azithromycin (ZITHROMAX) tablet 250 mg (250 mg Oral Given 03/04/22 1935)  azithromycin (ZITHROMAX) tablet 250 mg (250 mg Oral Given 03/04/22 1934)  sodium chloride 0.9 % bolus 1,000 mL (0 mLs Intravenous Stopped 03/04/22 1906)  cefTRIAXone (ROCEPHIN) injection 500 mg (500 mg Intramuscular Given 03/04/22 1936)    Initial Impression / Assessment and Plan / UC Course  I have reviewed the triage vital signs and the nursing notes.  Pertinent labs & imaging results that were available during my care of the patient were reviewed by me and considered in my medical decision making (see chart for details).       Patient is a  56 y.o. male with history H. pylori, duodenal nodule and gastric intestinal metaplasia who presents after having insidious diarrhea and abdominal.  Overall, patient is nontoxic-appearing, well-hydrated, and in no acute distress.  He is tachycardic I suspect may be due to to dehydration.  Timothy Moore is afebrile.  Exam is not concerning for an acute abdomen.  Obtained UA, CBC, CMP, and lipase.  Patient was given 1 L IV normal saline.   CBC, CMP and lipase are grossly unremarkable.  Serum creatinine is stable.  UA did not show signs of pyuria and there was no hematuria to suggest kidney stones.  I suspect his diarrhea may be foodborne or viral related.  Advised to stop using MiraLAX as this can make diarrhea worse.  Treat diarrhea with Imodium.   He was initially hypertensive but forgot to take his medications.  Took his amlodipine in the exam room today.  On exam, he has bilateral epididymal tenderness concerning for epididymitis.  Unfortunately, he urinated prior to getting his gonorrhea and chlamydia swab.  Offered prophylactic treatment and he is agreeable.  He was given IM ceftriaxone 500  mg with 1000 mg of azithromycin.  Patient advised to get a ultrasound if this does not resolve his symptoms.  He voiced understanding.  Follow-up, return and ED precautions given.  Discussed MDM, treatment plan and plan for follow-up with patient who agrees with plan.    Final Clinical Impressions(s) / UC Diagnoses   Final diagnoses:  Diarrhea, unspecified type  Epididymitis  Exposure to sexually transmitted disease (STD)  Generalized abdominal pain     Discharge Instructions      Your blood and urine testing were normal.  I suspect your diarrhea may be food or virus related.  Your symptoms should improve over the next few days.  We gave you a medication called loperamide to take up to 4 times daily as needed for diarrhea.  Do not use any more MiraLAX at this can make diarrhea worse.    You were given treatment for STD's that could cause testicular pain tonight.      ED Prescriptions     Medication Sig Dispense Auth. Provider   loperamide (IMODIUM) 2 MG capsule Take 1 capsule (2 mg total) by mouth 4 (four) times daily as needed for diarrhea or loose stools. 12 capsule Lyndee Hensen, DO      PDMP not reviewed this encounter.   Lyndee Hensen, DO 03/04/22 2050

## 2022-03-04 NOTE — ED Triage Notes (Signed)
Chief Complaint: diarrhea, abdominal pain, testicular pain. No nausea or vomiting. States abdomen feels bloated. Patient having urinary frequency, states urinae was dark and strong last week. No swelling, blood, or injuries to the testicles.   Onset: diarrhea yesterday, testicular pain today   Prescriptions or OTC medications tried: Yes- miralax    with no relief  Sick exposure: No  New foods, medications, or products: No  Recent Travel: No

## 2022-03-16 ENCOUNTER — Encounter: Payer: BC Managed Care – PPO | Admitting: Nurse Practitioner

## 2022-03-28 ENCOUNTER — Encounter: Payer: Self-pay | Admitting: Nurse Practitioner

## 2022-03-28 ENCOUNTER — Ambulatory Visit (INDEPENDENT_AMBULATORY_CARE_PROVIDER_SITE_OTHER): Payer: BC Managed Care – PPO | Admitting: Nurse Practitioner

## 2022-03-28 VITALS — BP 130/72 | HR 65 | Temp 98.4°F | Resp 16 | Ht 65.0 in | Wt 172.0 lb

## 2022-03-28 DIAGNOSIS — I10 Essential (primary) hypertension: Secondary | ICD-10-CM

## 2022-03-28 DIAGNOSIS — Z0001 Encounter for general adult medical examination with abnormal findings: Secondary | ICD-10-CM

## 2022-03-28 DIAGNOSIS — Z125 Encounter for screening for malignant neoplasm of prostate: Secondary | ICD-10-CM

## 2022-03-28 DIAGNOSIS — R300 Dysuria: Secondary | ICD-10-CM

## 2022-03-28 DIAGNOSIS — Z760 Encounter for issue of repeat prescription: Secondary | ICD-10-CM

## 2022-03-28 DIAGNOSIS — N528 Other male erectile dysfunction: Secondary | ICD-10-CM

## 2022-03-28 DIAGNOSIS — E782 Mixed hyperlipidemia: Secondary | ICD-10-CM

## 2022-03-28 MED ORDER — AMLODIPINE BESYLATE 5 MG PO TABS: 5.0000 mg | ORAL_TABLET | Freq: Every day | ORAL | 1 refills | Status: DC

## 2022-03-28 MED ORDER — OMEPRAZOLE 20 MG PO CPDR: 20.0000 mg | DELAYED_RELEASE_CAPSULE | Freq: Every day | ORAL | 1 refills | Status: DC

## 2022-03-28 MED ORDER — CETIRIZINE HCL 10 MG PO TABS: 10.0000 mg | ORAL_TABLET | Freq: Every day | ORAL | 1 refills | Status: DC

## 2022-03-28 MED ORDER — SILDENAFIL CITRATE 100 MG PO TABS: ORAL_TABLET | ORAL | 2 refills | Status: DC

## 2022-03-28 NOTE — Progress Notes (Signed)
[facility] [place], [place] [address]  Internal MEDICINE  Office Visit Note  Patient Name: Timothy Moore  [id number]  [id number]  Date of Service: 03/28/2022  Chief Complaint  Patient presents with  . Annual Exam  . Gastroesophageal Reflux  . Hypertension    HPI Timothy Moore presents for an annual well visit and physical exam.  Well-appearing 57 y.o. male with hypertension, high cholesterol, and GERD Routine CRC screening: done in 2022, due in 2032 Labs: some UTD, but still need to have cholesterol and PSA checked.  New or worsening pain: none Other concerns: none    Current Medication: Outpatient Encounter Medications as of 03/28/2022  Medication Sig  . loperamide (IMODIUM) 2 MG capsule Take 1 capsule (2 mg total) by mouth 4 (four) times daily as needed for diarrhea or loose stools.  . Multiple Vitamin (MULTIVITAMIN) tablet Take 1 tablet by mouth daily.  . naproxen (NAPROSYN) 500 MG tablet Take 1 tablet (500 mg total) by mouth 2 (two) times daily as needed (pain).  . [DISCONTINUED] amLODipine (NORVASC) 5 MG tablet Take 1 tablet (5 mg total) by mouth daily.  . [DISCONTINUED] cetirizine (ZYRTEC ALLERGY) 10 MG tablet Take 1 tablet (10 mg total) by mouth daily.  . [DISCONTINUED] omeprazole (PRILOSEC) 20 MG capsule Take 1 capsule (20 mg total) by mouth daily.  . [DISCONTINUED] sildenafil (VIAGRA) 100 MG tablet TAKE 1/2 TO 1 (ONE-HALF TO ONE) TABLET BY MOUTH ONCE DAILY AS NEEDED FOR ERECTILE DYSFUNCTION  . amLODipine (NORVASC) 5 MG tablet Take 1 tablet (5 mg total) by mouth daily.  . cetirizine (ZYRTEC ALLERGY) 10 MG tablet Take 1 tablet (10 mg total) by mouth daily.  Marland Kitchen omeprazole (PRILOSEC) 20 MG capsule Take 1 capsule (20 mg total) by mouth daily.  . sildenafil (VIAGRA) 100 MG tablet TAKE 1/2 TO 1 (ONE-HALF TO ONE) TABLET BY MOUTH ONCE DAILY AS NEEDED FOR ERECTILE DYSFUNCTION   No facility-administered encounter medications on file as of 03/28/2022.     Surgical History: Past Surgical History:  Procedure Laterality Date  . COLONOSCOPY WITH PROPOFOL N/A 05/29/2020   Procedure: COLONOSCOPY WITH PROPOFOL;  Surgeon: [name], MD;  Location: [facility] ENDOSCOPY;  Service: Endoscopy;  Laterality: N/A;  . ESOPHAGOGASTRODUODENOSCOPY (EGD) WITH PROPOFOL N/A 05/29/2020   Procedure: ESOPHAGOGASTRODUODENOSCOPY (EGD) WITH PROPOFOL;  Surgeon: [name], MD;  Location: [facility] ENDOSCOPY;  Service: Endoscopy;  Laterality: N/A;  . ESOPHAGOGASTRODUODENOSCOPY (EGD) WITH PROPOFOL N/A 03/12/2021   Procedure: ESOPHAGOGASTRODUODENOSCOPY (EGD) WITH PROPOFOL;  Surgeon: [name], MD;  Location: [facility] ENDOSCOPY;  Service: Endoscopy;  Laterality: N/A;  . HAND SURGERY Left     Medical History: Past Medical History:  Diagnosis Date  . GERD (gastroesophageal reflux disease)   . Hypertension     Family History: Family History  Problem Relation Age of Onset  . Kidney Stones Mother   . Cancer Father   . Lupus Sister   . Cancer Maternal Grandmother   . Cancer Paternal Grandmother     Social History   Socioeconomic History  . Marital status: Single    Spouse name: Not on file  . Number of children: Not on file  . Years of education: Not on file  . Highest education level: Not on file  Occupational History  . Not on file  Tobacco Use  . Smoking status: Former    Types: Cigarettes    Quit date: 03/14/2009    Years since quitting: 13.0  . Smokeless tobacco: Never  . Tobacco  comments:    quit 11 years ago   Vaping Use  . Vaping Use: Never used  Substance and Sexual Activity  . Alcohol use: Yes    Comment: 2 shots a month  . Drug use: Never    Comment: marijuana 20 years ago  . Sexual activity: Yes    Birth control/protection: Condom  Other Topics Concern  . Not on file  Social History Narrative  . Not on file   Social Determinants of Health   Financial Resource Strain: Not on file  Food Insecurity: Not on file   Transportation Needs: Not on file  Physical Activity: Not on file  Stress: Not on file  Social Connections: Not on file  Intimate Partner Violence: Not At Risk (12/13/2021)   Humiliation, Afraid, Rape, and Kick questionnaire   . Fear of Current or Ex-Partner: No   . Emotionally Abused: No   . Physically Abused: No   . Sexually Abused: No      Review of Systems  Vital Signs: BP 130/72 Comment: 136/92  Pulse 65   Temp 98.4 F (36.9 C)   Resp 16   Ht '5\' 5"'$  (1.651 m)   Wt 172 lb (78 kg)   SpO2 98%   BMI 28.62 kg/m    Physical Exam     Assessment/Plan: 1. Dysuria - UA/M w/rflx Culture, Routine  2. Screening for prostate cancer - PSA Total (Reflex To Free)  3. Mixed hyperlipidemia - Lipid Profile  4. Medication refill - amLODipine (NORVASC) 5 MG tablet; Take 1 tablet (5 mg total) by mouth daily.  Dispense: 90 tablet; Refill: 1 - cetirizine (ZYRTEC ALLERGY) 10 MG tablet; Take 1 tablet (10 mg total) by mouth daily.  Dispense: 90 tablet; Refill: 1 - omeprazole (PRILOSEC) 20 MG capsule; Take 1 capsule (20 mg total) by mouth daily.  Dispense: 90 capsule; Refill: 1  5. Essential hypertension - amLODipine (NORVASC) 5 MG tablet; Take 1 tablet (5 mg total) by mouth daily.  Dispense: 90 tablet; Refill: 1  6. Other male erectile dysfunction - sildenafil (VIAGRA) 100 MG tablet; TAKE 1/2 TO 1 (ONE-HALF TO ONE) TABLET BY MOUTH ONCE DAILY AS NEEDED FOR ERECTILE DYSFUNCTION  Dispense: 15 tablet; Refill: 2     General Counseling: Timothy Moore understanding of the findings of todays visit and agrees with plan of treatment. I have discussed any further diagnostic evaluation that may be needed or ordered today. We also reviewed his medications today. he has been encouraged to call the office with any questions or concerns that should arise related to todays visit.    Orders Placed This Encounter  Procedures  . UA/M w/rflx Culture, Routine  . PSA Total (Reflex To Free)   . Lipid Profile    Meds ordered this encounter  Medications  . amLODipine (NORVASC) 5 MG tablet    Sig: Take 1 tablet (5 mg total) by mouth daily.    Dispense:  90 tablet    Refill:  1  . cetirizine (ZYRTEC ALLERGY) 10 MG tablet    Sig: Take 1 tablet (10 mg total) by mouth daily.    Dispense:  90 tablet    Refill:  1  . omeprazole (PRILOSEC) 20 MG capsule    Sig: Take 1 capsule (20 mg total) by mouth daily.    Dispense:  90 capsule    Refill:  1  . sildenafil (VIAGRA) 100 MG tablet    Sig: TAKE 1/2 TO 1 (ONE-HALF TO ONE) TABLET BY MOUTH ONCE  DAILY AS NEEDED FOR ERECTILE DYSFUNCTION    Dispense:  15 tablet    Refill:  2    Return in about 2 months (around 05/27/2022) for F/U, BP check, Timothy Moore.   Total time spent:*** Minutes Time spent includes review of chart, medications, test results, and follow up plan with the patient.   [place] Controlled Substance Database was reviewed by me.  This patient was seen by Jonetta Osgood, FNP-C in collaboration with Dr. Clayborn Bigness as a part of collaborative care agreement.  Sloan Galentine R. Valetta Fuller, MSN, FNP-C Internal medicine

## 2022-03-29 ENCOUNTER — Encounter: Payer: Self-pay | Admitting: Nurse Practitioner

## 2022-03-29 LAB — UA/M W/RFLX CULTURE, ROUTINE
Bilirubin, UA: NEGATIVE
Glucose, UA: NEGATIVE
Ketones, UA: NEGATIVE
Leukocytes,UA: NEGATIVE
Nitrite, UA: NEGATIVE
Protein,UA: NEGATIVE
RBC, UA: NEGATIVE
Specific Gravity, UA: 1.005 (ref 1.005–1.030)
Urobilinogen, Ur: 0.2 mg/dL (ref 0.2–1.0)
pH, UA: 7 (ref 5.0–7.5)

## 2022-03-29 LAB — MICROSCOPIC EXAMINATION
Bacteria, UA: NONE SEEN
Casts: NONE SEEN /lpf
Epithelial Cells (non renal): NONE SEEN /hpf (ref 0–10)
RBC, Urine: NONE SEEN /hpf (ref 0–2)
WBC, UA: NONE SEEN /hpf (ref 0–5)

## 2022-03-31 ENCOUNTER — Encounter: Payer: Self-pay | Admitting: Nurse Practitioner

## 2022-04-12 DIAGNOSIS — Z125 Encounter for screening for malignant neoplasm of prostate: Secondary | ICD-10-CM | POA: Diagnosis not present

## 2022-04-12 DIAGNOSIS — E782 Mixed hyperlipidemia: Secondary | ICD-10-CM | POA: Diagnosis not present

## 2022-04-13 LAB — LIPID PANEL
Chol/HDL Ratio: 4.1 ratio (ref 0.0–5.0)
Cholesterol, Total: 195 mg/dL (ref 100–199)
HDL: 48 mg/dL (ref >39)
LDL Chol Calc (NIH): 123 mg/dL — ABNORMAL HIGH (ref 0–99)
Triglycerides: 134 mg/dL (ref 0–149)
VLDL Cholesterol Cal: 24 mg/dL (ref 5–40)

## 2022-04-13 LAB — PSA TOTAL (REFLEX TO FREE): Prostate Specific Ag, Serum: 1.2 ng/mL (ref 0.0–4.0)

## 2022-04-18 NOTE — Progress Notes (Signed)
Cholesterol is continuing to improve The rest of his labs are normal, please let him know

## 2022-04-19 ENCOUNTER — Telehealth: Payer: Self-pay

## 2022-04-19 NOTE — Telephone Encounter (Signed)
Lmom to call us back  regarding labs result

## 2022-04-19 NOTE — Telephone Encounter (Signed)
Pt notified for labs

## 2022-04-19 NOTE — Telephone Encounter (Signed)
-----   Message from Jonetta Osgood, NP sent at 04/18/2022  4:50 PM EST ----- Cholesterol is continuing to improve The rest of his labs are normal, please let him know

## 2022-04-20 ENCOUNTER — Encounter: Payer: Self-pay | Admitting: Nurse Practitioner

## 2022-04-20 ENCOUNTER — Ambulatory Visit: Payer: BC Managed Care – PPO | Admitting: Nurse Practitioner

## 2022-04-20 VITALS — BP 132/82 | HR 92 | Temp 97.8°F | Resp 16 | Ht 65.0 in | Wt 170.0 lb

## 2022-04-20 DIAGNOSIS — E782 Mixed hyperlipidemia: Secondary | ICD-10-CM | POA: Diagnosis not present

## 2022-04-20 DIAGNOSIS — K219 Gastro-esophageal reflux disease without esophagitis: Secondary | ICD-10-CM | POA: Diagnosis not present

## 2022-04-20 DIAGNOSIS — N528 Other male erectile dysfunction: Secondary | ICD-10-CM | POA: Diagnosis not present

## 2022-04-20 DIAGNOSIS — I10 Essential (primary) hypertension: Secondary | ICD-10-CM

## 2022-04-20 DIAGNOSIS — Z0001 Encounter for general adult medical examination with abnormal findings: Secondary | ICD-10-CM

## 2022-04-20 MED ORDER — SILDENAFIL CITRATE 100 MG PO TABS: ORAL_TABLET | ORAL | 2 refills | Status: DC

## 2022-04-20 NOTE — Progress Notes (Unsigned)
[facility] [place], [place] [address]  Internal MEDICINE  Office Visit Note  Patient Name: Timothy Moore  [id number]  [id number]  Date of Service: 04/20/2022  Chief Complaint  Patient presents with   Follow-up   Gastroesophageal Reflux   Hypertension    HPI Timothy Moore presents for a follow-up visit for  Blood pressure      Current Medication: Outpatient Encounter Medications as of 04/20/2022  Medication Sig   amLODipine (NORVASC) 5 MG tablet Take 1 tablet (5 mg total) by mouth daily.   cetirizine (ZYRTEC ALLERGY) 10 MG tablet Take 1 tablet (10 mg total) by mouth daily.   loperamide (IMODIUM) 2 MG capsule Take 1 capsule (2 mg total) by mouth 4 (four) times daily as needed for diarrhea or loose stools.   Multiple Vitamin (MULTIVITAMIN) tablet Take 1 tablet by mouth daily.   naproxen (NAPROSYN) 500 MG tablet Take 1 tablet (500 mg total) by mouth 2 (two) times daily as needed (pain).   omeprazole (PRILOSEC) 20 MG capsule Take 1 capsule (20 mg total) by mouth daily.   [DISCONTINUED] sildenafil (VIAGRA) 100 MG tablet TAKE 1/2 TO 1 (ONE-HALF TO ONE) TABLET BY MOUTH ONCE DAILY AS NEEDED FOR ERECTILE DYSFUNCTION   sildenafil (VIAGRA) 100 MG tablet TAKE 1/2 TO 1 (ONE-HALF TO ONE) TABLET BY MOUTH ONCE DAILY AS NEEDED FOR ERECTILE DYSFUNCTION   No facility-administered encounter medications on file as of 04/20/2022.    Surgical History: Past Surgical History:  Procedure Laterality Date   COLONOSCOPY WITH PROPOFOL N/A 05/29/2020   Procedure: COLONOSCOPY WITH PROPOFOL;  Surgeon: [name], MD;  Location: [facility] ENDOSCOPY;  Service: Endoscopy;  Laterality: N/A;   ESOPHAGOGASTRODUODENOSCOPY (EGD) WITH PROPOFOL N/A 05/29/2020   Procedure: ESOPHAGOGASTRODUODENOSCOPY (EGD) WITH PROPOFOL;  Surgeon: [name], MD;  Location: [facility] END[facility];  Service: Endoscopy;  Laterality: N/A;   ESOPHAGOGASTRODUODENOSCOPY (EGD) WITH PROPOFOL N/A 03/12/2021   Procedure:  ESOPHAGOGASTRODUODENOSCOPY (EGD) WITH PROPOFOL;  Surgeon: [name], MD;  Location: [facility] END[facility];  Service: Endoscopy;  Laterality: N/A;   HAND SURGERY Left     Medical History: Past Medical History:  Diagnosis Date   GERD (gastroesophageal reflux disease)    Hypertension     Family History: Family History  Problem Relation Age of Onset   Kidney Stones Mother    Cancer Father    Lupus Sister    Cancer Maternal Grandmother    Cancer Paternal Grandmother     Social History   Socioeconomic History   Marital status: Single    Spouse name: Not on file   Number of children: Not on file   Years of education: Not on file   Highest education level: Not on file  Occupational History   Not on file  Tobacco Use   Smoking status: Former    Types: Cigarettes    Quit date: 03/14/2009    Years since quitting: 13.1   Smokeless tobacco: Never   Tobacco comments:    quit 11 years ago   Vaping Use   Vaping Use: Never used  Substance and Sexual Activity   Alcohol use: Yes    Comment: 2 shots a month   Drug use: Never    Comment: marijuana 20 years ago   Sexual activity: Yes    Birth control/protection: Condom  Other Topics Concern   Not on file  Social History Narrative   Not on file   Social Determinants of Health   Financial Resource Strain: Not on file  Food Insecurity: Not on file  Transportation Needs: Not on file  Physical Activity: Not on file  Stress: Not on file  Social Connections: Not on file  Intimate Partner Violence: Not At Risk (12/13/2021)   Humiliation, Afraid, Rape, and Kick questionnaire    Fear of Current or Ex-Partner: No    Emotionally Abused: No    Physically Abused: No    Sexually Abused: No      Review of Systems  Vital Signs: BP 132/82 Comment: 144/97  Pulse 92   Temp 97.8 F (36.6 C)   Resp 16   Ht '5\' 5"'$  (1.651 m)   Wt 170 lb (77.1 kg)   SpO2 97%   BMI 28.29 kg/m    Physical  Exam     Assessment/Plan:   General Counseling: Timothy Moore verbalizes understanding of the findings of todays visit and agrees with plan of treatment. I have discussed any further diagnostic evaluation that may be needed or ordered today. We also reviewed his medications today. he has been encouraged to call the office with any questions or concerns that should arise related to todays visit.    No orders of the defined types were placed in this encounter.   Meds ordered this encounter  Medications   sildenafil (VIAGRA) 100 MG tablet    Sig: TAKE 1/2 TO 1 (ONE-HALF TO ONE) TABLET BY MOUTH ONCE DAILY AS NEEDED FOR ERECTILE DYSFUNCTION    Dispense:  15 tablet    Refill:  2    Return in about 2 months (around 06/19/2022) for F/U, BP check, Pricilla Moehle PCP.   Total time spent:*** Minutes Time spent includes review of chart, medications, test results, and follow up plan with the patient.   [place] Controlled Substance Database was reviewed by me.  This patient was seen by Jonetta Osgood, FNP-C in collaboration with Dr. Clayborn Bigness as a part of collaborative care agreement.   Prim Morace R. Valetta Fuller, MSN, FNP-C Internal medicine

## 2022-04-21 ENCOUNTER — Encounter: Payer: Self-pay | Admitting: Nurse Practitioner

## 2022-05-28 ENCOUNTER — Encounter: Payer: Self-pay | Admitting: Nurse Practitioner

## 2022-05-30 ENCOUNTER — Ambulatory Visit: Payer: BC Managed Care – PPO | Admitting: Nurse Practitioner

## 2022-05-30 MED ORDER — IBUPROFEN 800 MG PO TABS: 800.0000 mg | ORAL_TABLET | Freq: Three times a day (TID) | ORAL | 0 refills | Status: DC | PRN

## 2022-06-01 ENCOUNTER — Ambulatory Visit (INDEPENDENT_AMBULATORY_CARE_PROVIDER_SITE_OTHER): Payer: BC Managed Care – PPO | Admitting: Nurse Practitioner

## 2022-06-01 ENCOUNTER — Encounter: Payer: Self-pay | Admitting: Nurse Practitioner

## 2022-06-01 VITALS — BP 130/80 | HR 89 | Temp 98.6°F | Resp 16 | Ht 65.0 in | Wt 175.2 lb

## 2022-06-01 DIAGNOSIS — J018 Other acute sinusitis: Secondary | ICD-10-CM | POA: Diagnosis not present

## 2022-06-01 DIAGNOSIS — H6502 Acute serous otitis media, left ear: Secondary | ICD-10-CM

## 2022-06-01 MED ORDER — AMOXICILLIN-POT CLAVULANATE 875-125 MG PO TABS: 1.0000 | ORAL_TABLET | Freq: Two times a day (BID) | ORAL | 0 refills | Status: AC

## 2022-06-01 NOTE — Progress Notes (Signed)
[facility] [place], [place] [address]  Internal MEDICINE  Office Visit Note  Patient Name: Timothy Moore  [id number]  [id number]  Date of Service: 06/01/2022  Chief Complaint  Patient presents with   Acute Visit     HPI Timothy Moore presents for an acute sick visit for sinus infection.  --onset: yesterday  Reports chills, headache, nasal congestion, fatigue Covid test was negative Watery eye drainage      Current Medication:  Outpatient Encounter Medications as of 06/01/2022  Medication Sig   amLODipine (NORVASC) 5 MG tablet Take 1 tablet (5 mg total) by mouth daily.   amoxicillin-clavulanate (AUGMENTIN) 875-125 MG tablet Take 1 tablet by mouth 2 (two) times daily for 10 days. Take with food   cetirizine (ZYRTEC ALLERGY) 10 MG tablet Take 1 tablet (10 mg total) by mouth daily.   ibuprofen (ADVIL) 800 MG tablet Take 1 tablet (800 mg total) by mouth every 8 (eight) hours as needed.   loperamide (IMODIUM) 2 MG capsule Take 1 capsule (2 mg total) by mouth 4 (four) times daily as needed for diarrhea or loose stools.   Multiple Vitamin (MULTIVITAMIN) tablet Take 1 tablet by mouth daily.   naproxen (NAPROSYN) 500 MG tablet Take 1 tablet (500 mg total) by mouth 2 (two) times daily as needed (pain).   omeprazole (PRILOSEC) 20 MG capsule Take 1 capsule (20 mg total) by mouth daily.   sildenafil (VIAGRA) 100 MG tablet TAKE 1/2 TO 1 (ONE-HALF TO ONE) TABLET BY MOUTH ONCE DAILY AS NEEDED FOR ERECTILE DYSFUNCTION   No facility-administered encounter medications on file as of 06/01/2022.      Medical History: Past Medical History:  Diagnosis Date   GERD (gastroesophageal reflux disease)    Hypertension      Vital Signs: BP 130/80   Pulse 89   Temp 98.6 F (37 C)   Resp 16   Ht 5\' 5"  (1.651 m)   Wt 175 lb 3.2 oz (79.5 kg)   SpO2 93%   BMI 29.15 kg/m    Review of Systems  Constitutional:  Positive for chills and fatigue.  HENT:  Positive for  congestion, postnasal drip and rhinorrhea.   Respiratory:  Positive for shortness of breath. Negative for cough, chest tightness and wheezing.   Cardiovascular:  Negative for chest pain and palpitations.  Gastrointestinal: Negative.   Neurological:  Positive for headaches.    Physical Exam Vitals reviewed.  Constitutional:      Appearance: Normal appearance. He is ill-appearing.  HENT:     Head: Normocephalic and atraumatic.     Right Ear: Hearing, tympanic membrane, ear canal and external ear normal.     Left Ear: Hearing normal. Swelling present. No tenderness. Tympanic membrane is injected and erythematous.     Nose: Mucosal edema, congestion and rhinorrhea present. Rhinorrhea is clear.     Right Turbinates: Swollen and pale.     Left Turbinates: Swollen and pale.     Mouth/Throat:     Lips: Pink.     Mouth: Mucous membranes are moist.     Pharynx: Uvula midline. Posterior oropharyngeal erythema present.  Eyes:     General: Lids are normal. Vision grossly intact. Gaze aligned appropriately.     Conjunctiva/sclera:     Right eye: Right conjunctiva is injected.     Left eye: Left conjunctiva is injected.  Cardiovascular:     Rate and Rhythm: Regular rhythm. Tachycardia present.     Heart sounds: Normal heart sounds.  No murmur heard. Pulmonary:     Effort: Pulmonary effort is normal. No accessory muscle usage or respiratory distress.     Breath sounds: Examination of the right-upper field reveals decreased breath sounds. Examination of the right-middle field reveals decreased breath sounds. Examination of the right-lower field reveals decreased breath sounds. Decreased breath sounds present.  Musculoskeletal:     Cervical back: No tenderness.  Lymphadenopathy:     Cervical: No cervical adenopathy.  Neurological:     Mental Status: He is alert and oriented to person, place, and time.  Psychiatric:        Mood and Affect: Mood normal.        Behavior: Behavior normal.        Assessment/Plan: 1. Acute non-recurrent sinusitis of other sinus Empiric antibiotic treatment prescribed.  - amoxicillin-clavulanate (AUGMENTIN) 875-125 MG tablet; Take 1 tablet by mouth 2 (two) times daily for 10 days. Take with food  Dispense: 20 tablet; Refill: 0  2. Non-recurrent acute serous otitis media of left ear Antibiotic treatment prescribed. - amoxicillin-clavulanate (AUGMENTIN) 875-125 MG tablet; Take 1 tablet by mouth 2 (two) times daily for 10 days. Take with food  Dispense: 20 tablet; Refill: 0   General Counseling: Timothy Moore understanding of the findings of todays visit and agrees with plan of treatment. I have discussed any further diagnostic evaluation that may be needed or ordered today. We also reviewed his medications today. he has been encouraged to call the office with any questions or concerns that should arise related to todays visit.    Counseling:    No orders of the defined types were placed in this encounter.   Meds ordered this encounter  Medications   amoxicillin-clavulanate (AUGMENTIN) 875-125 MG tablet    Sig: Take 1 tablet by mouth 2 (two) times daily for 10 days. Take with food    Dispense:  20 tablet    Refill:  0    Return if symptoms worsen or fail to improve.  [place] Controlled Substance Database was reviewed by me for overdose risk score (ORS)  Time spent:20 Minutes Time spent with patient included reviewing progress notes, labs, imaging studies, and discussing plan for follow up.   This patient was seen by Jonetta Osgood, FNP-C in collaboration with Dr. Clayborn Bigness as a part of collaborative care agreement.  Timothy Moore R. Valetta Fuller, MSN, FNP-C Internal Medicine

## 2022-06-14 ENCOUNTER — Encounter: Payer: Self-pay | Admitting: Nurse Practitioner

## 2022-06-16 ENCOUNTER — Other Ambulatory Visit: Payer: Self-pay | Admitting: Nurse Practitioner

## 2022-06-16 DIAGNOSIS — N528 Other male erectile dysfunction: Secondary | ICD-10-CM

## 2022-06-20 ENCOUNTER — Encounter: Payer: Self-pay | Admitting: Nurse Practitioner

## 2022-06-20 ENCOUNTER — Ambulatory Visit (INDEPENDENT_AMBULATORY_CARE_PROVIDER_SITE_OTHER): Payer: BC Managed Care – PPO | Admitting: Nurse Practitioner

## 2022-06-20 VITALS — BP 115/75 | HR 83 | Temp 98.3°F | Resp 16 | Ht 65.0 in | Wt 172.0 lb

## 2022-06-20 DIAGNOSIS — N528 Other male erectile dysfunction: Secondary | ICD-10-CM | POA: Diagnosis not present

## 2022-06-20 DIAGNOSIS — I10 Essential (primary) hypertension: Secondary | ICD-10-CM

## 2022-06-20 DIAGNOSIS — Z113 Encounter for screening for infections with a predominantly sexual mode of transmission: Secondary | ICD-10-CM | POA: Diagnosis not present

## 2022-06-20 DIAGNOSIS — Z202 Contact with and (suspected) exposure to infections with a predominantly sexual mode of transmission: Secondary | ICD-10-CM

## 2022-06-20 MED ORDER — SILDENAFIL CITRATE 100 MG PO TABS: ORAL_TABLET | ORAL | 2 refills | Status: DC

## 2022-06-20 MED ORDER — METRONIDAZOLE 500 MG PO TABS: 2000.0000 mg | ORAL_TABLET | Freq: Three times a day (TID) | ORAL | 0 refills | Status: DC

## 2022-06-20 MED ORDER — METRONIDAZOLE 500 MG PO TABS
2000.0000 mg | ORAL_TABLET | Freq: Once | ORAL | 0 refills | Status: AC
Start: 2022-06-20 — End: 2022-06-20

## 2022-06-20 NOTE — Progress Notes (Signed)
[facility] [address] [place], [place] [address]  Internal MEDICINE  Office Visit Note  Patient Name: Timothy Moore  [id number]  [id number]  Date of Service: 06/20/2022  Chief Complaint  Patient presents with   Gastroesophageal Reflux   Hypertension   Follow-up    HPI Timothy Moore presents for a follow up visit for hypertension Hypertension -- blood pressure is stable when rechecked.  Check for STDs -- recent sexual partner told him today that she tested positive for trichomonas.  Refill of viagra   Current Medication: Outpatient Encounter Medications as of 06/20/2022  Medication Sig   amLODipine (NORVASC) 5 MG tablet Take 1 tablet (5 mg total) by mouth daily.   cetirizine (ZYRTEC ALLERGY) 10 MG tablet Take 1 tablet (10 mg total) by mouth daily.   ibuprofen (ADVIL) 800 MG tablet Take 1 tablet (800 mg total) by mouth every 8 (eight) hours as needed.   loperamide (IMODIUM) 2 MG capsule Take 1 capsule (2 mg total) by mouth 4 (four) times daily as needed for diarrhea or loose stools.   Multiple Vitamin (MULTIVITAMIN) tablet Take 1 tablet by mouth daily.   naproxen (NAPROSYN) 500 MG tablet Take 1 tablet (500 mg total) by mouth 2 (two) times daily as needed (pain).   omeprazole (PRILOSEC) 20 MG capsule Take 1 capsule (20 mg total) by mouth daily.   [DISCONTINUED] metroNIDAZOLE (FLAGYL) 500 MG tablet Take 4 tablets (2,000 mg total) by mouth 3 (three) times daily.   [DISCONTINUED] sildenafil (VIAGRA) 100 MG tablet TAKE 1/2 - 1 TABLET BY MOUTH DAILY AS NEEDED FOR ERECTILE DYSFUNCTION   metroNIDAZOLE (FLAGYL) 500 MG tablet Take 4 tablets (2,000 mg total) by mouth once for 1 dose.   sildenafil (VIAGRA) 100 MG tablet TAKE 1/2 - 1 TABLET BY MOUTH DAILY AS NEEDED FOR ERECTILE DYSFUNCTION   No facility-administered encounter medications on file as of 06/20/2022.    Surgical History: Past Surgical History:  Procedure Laterality Date   COLONOSCOPY WITH PROPOFOL N/A 05/29/2020    Procedure: COLONOSCOPY WITH PROPOFOL;  Surgeon: Pasty Spillers, MD;  Location: [facility];  Service: Endoscopy;  Laterality: N/A;   ESOPHAGOGASTRODUODENOSCOPY (EGD) WITH PROPOFOL N/A 05/29/2020   Procedure: ESOPHAGOGASTRODUODENOSCOPY (EGD) WITH PROPOFOL;  Surgeon: Pasty Spillers, MD;  Location: [facility];  Service: Endoscopy;  Laterality: N/A;   ESOPHAGOGASTRODUODENOSCOPY (EGD) WITH PROPOFOL N/A 03/12/2021   Procedure: ESOPHAGOGASTRODUODENOSCOPY (EGD) WITH PROPOFOL;  Surgeon: Pasty Spillers, MD;  Location: [facility];  Service: Endoscopy;  Laterality: N/A;   HAND SURGERY Left     Medical History: Past Medical History:  Diagnosis Date   GERD (gastroesophageal reflux disease)    Hypertension     Family History: Family History  Problem Relation Age of Onset   Kidney Stones Mother    Cancer Father    Lupus Sister    Cancer Maternal Grandmother    Cancer Paternal Grandmother     Social History   Socioeconomic History   Marital status: Single    Spouse name: Not on file   Number of children: Not on file   Years of education: Not on file   Highest education level: Not on file  Occupational History   Not on file  Tobacco Use   Smoking status: Former    Types: Cigarettes    Quit date: 03/14/2009    Years since quitting: 13.2   Smokeless tobacco: Never   Tobacco comments:    quit 11 years ago   Vaping Use   Vaping  Use: Never used  Substance and Sexual Activity   Alcohol use: Yes    Comment: 2 shots a month   Drug use: Never    Comment: marijuana 20 years ago   Sexual activity: Yes    Birth control/protection: Condom  Other Topics Concern   Not on file  Social History Narrative   Not on file   Social Determinants of Health   Financial Resource Strain: Not on file  Food Insecurity: Not on file  Transportation Needs: Not on file  Physical Activity: Not on file  Stress: Not on file  Social Connections: Not on file  Intimate Partner  Violence: Not At Risk (12/13/2021)   Humiliation, Afraid, Rape, and Kick questionnaire    Fear of Current or Ex-Partner: No    Emotionally Abused: No    Physically Abused: No    Sexually Abused: No      Review of Systems  Constitutional:  Negative for chills, fatigue and unexpected weight change.  HENT:  Negative for congestion, rhinorrhea, sneezing and sore throat.   Eyes:  Negative for redness.  Respiratory:  Negative for cough, chest tightness and shortness of breath.   Cardiovascular:  Negative for chest pain and palpitations.  Gastrointestinal:  Negative for abdominal pain, constipation, diarrhea, nausea and vomiting.  Genitourinary:  Negative for dysuria and frequency.  Musculoskeletal:  Negative for arthralgias, back pain, joint swelling and neck pain.  Skin:  Negative for rash.  Neurological: Negative.  Negative for tremors and numbness.  Hematological:  Negative for adenopathy. Does not bruise/bleed easily.  Psychiatric/Behavioral:  Negative for behavioral problems (Depression), sleep disturbance and suicidal ideas. The patient is not nervous/anxious.     Vital Signs: BP 115/75 Comment: 138/90  Pulse 83   Temp 98.3 F (36.8 C)   Resp 16   Ht 5\' 5"  (1.651 m)   Wt 172 lb (78 kg)   SpO2 94%   BMI 28.62 kg/m    Physical Exam Vitals reviewed.  Constitutional:      General: He is not in acute distress.    Appearance: Normal appearance. He is not ill-appearing.  HENT:     Head: Normocephalic and atraumatic.  Eyes:     Pupils: Pupils are equal, round, and reactive to light.  Cardiovascular:     Rate and Rhythm: Normal rate and regular rhythm.  Pulmonary:     Effort: Pulmonary effort is normal. No respiratory distress.  Neurological:     Mental Status: He is alert and oriented to person, place, and time.  Psychiatric:        Mood and Affect: Mood normal.        Behavior: Behavior normal.        Assessment/Plan: 1. Essential hypertension Stable, continue  medications as prescribed.   2. Exposure to trichomonas Prophylactic tx with metronidazole.  - metroNIDAZOLE (FLAGYL) 500 MG tablet; Take 4 tablets (2,000 mg total) by mouth once for 1 dose.  Dispense: 4 tablet; Refill: 0  3. Screening for STDs (sexually transmitted diseases) Urine sent for STD testing; prophylactic tx with metronidazole ordered - Chlamydia/Gonococcus/Trichomonas, NAA - metroNIDAZOLE (FLAGYL) 500 MG tablet; Take 4 tablets (2,000 mg total) by mouth once for 1 dose.  Dispense: 4 tablet; Refill: 0  4. Other male erectile dysfunction Refills ordered of sildenafil - sildenafil (VIAGRA) 100 MG tablet; TAKE 1/2 - 1 TABLET BY MOUTH DAILY AS NEEDED FOR ERECTILE DYSFUNCTION  Dispense: 10 tablet; Refill: 2   General Counseling: claudell nolden understanding of the findings  of todays visit and agrees with plan of treatment. I have discussed any further diagnostic evaluation that may be needed or ordered today. We also reviewed his medications today. he has been encouraged to call the office with any questions or concerns that should arise related to todays visit.    Orders Placed This Encounter  Procedures   Chlamydia/Gonococcus/Trichomonas, NAA    Meds ordered this encounter  Medications   DISCONTD: metroNIDAZOLE (FLAGYL) 500 MG tablet    Sig: Take 4 tablets (2,000 mg total) by mouth 3 (three) times daily.    Dispense:  4 tablet    Refill:  0   metroNIDAZOLE (FLAGYL) 500 MG tablet    Sig: Take 4 tablets (2,000 mg total) by mouth once for 1 dose.    Dispense:  4 tablet    Refill:  0    Discontinue previous order for metronidazole and fill this order asap.   sildenafil (VIAGRA) 100 MG tablet    Sig: TAKE 1/2 - 1 TABLET BY MOUTH DAILY AS NEEDED FOR ERECTILE DYSFUNCTION    Dispense:  10 tablet    Refill:  2    Return in about 2 months (around 08/20/2022) for F/U, BP check, Emmitte Surgeon PCP.   Total time spent:30 Minutes Time spent includes review of chart, medications, test  results, and follow up plan with the patient.   [place] Controlled Substance Database was reviewed by me.  This patient was seen by Sallyanne KusterAlyssa Clarisa Danser, FNP-C in collaboration with Dr. Beverely RisenFozia Khan as a part of collaborative care agreement.   Khyle Goodell R. Tedd SiasAbernathy, MSN, FNP-C Internal medicine

## 2022-06-23 LAB — CHLAMYDIA/GONOCOCCUS/TRICHOMONAS, NAA
Chlamydia by NAA: NEGATIVE
Gonococcus by NAA: NEGATIVE
Trich vag by NAA: NEGATIVE

## 2022-06-24 ENCOUNTER — Telehealth: Payer: Self-pay

## 2022-06-24 NOTE — Telephone Encounter (Signed)
Patient notified

## 2022-06-24 NOTE — Telephone Encounter (Signed)
-----   Message from Sallyanne Kuster, NP sent at 06/24/2022  8:02 AM EDT ----- Please let patient know that his STD urine screen was negative.

## 2022-06-24 NOTE — Progress Notes (Signed)
Please let patient know that his STD urine screen was negative.

## 2022-08-22 ENCOUNTER — Ambulatory Visit: Payer: BC Managed Care – PPO | Admitting: Nurse Practitioner

## 2022-08-29 ENCOUNTER — Ambulatory Visit: Payer: BC Managed Care – PPO | Admitting: Nurse Practitioner

## 2022-08-31 ENCOUNTER — Ambulatory Visit: Payer: BC Managed Care – PPO | Admitting: Nurse Practitioner

## 2022-08-31 ENCOUNTER — Encounter: Payer: Self-pay | Admitting: Nurse Practitioner

## 2022-08-31 VITALS — BP 135/70 | HR 83 | Temp 98.3°F | Resp 16 | Ht 65.0 in | Wt 166.2 lb

## 2022-08-31 DIAGNOSIS — I10 Essential (primary) hypertension: Secondary | ICD-10-CM

## 2022-08-31 DIAGNOSIS — Z0001 Encounter for general adult medical examination with abnormal findings: Secondary | ICD-10-CM

## 2022-08-31 DIAGNOSIS — N528 Other male erectile dysfunction: Secondary | ICD-10-CM | POA: Diagnosis not present

## 2022-08-31 DIAGNOSIS — K219 Gastro-esophageal reflux disease without esophagitis: Secondary | ICD-10-CM

## 2022-08-31 DIAGNOSIS — H539 Unspecified visual disturbance: Secondary | ICD-10-CM

## 2022-08-31 MED ORDER — OMEPRAZOLE 20 MG PO CPDR: 20.0000 mg | DELAYED_RELEASE_CAPSULE | Freq: Every day | ORAL | 1 refills | Status: DC

## 2022-08-31 MED ORDER — SILDENAFIL CITRATE 100 MG PO TABS: ORAL_TABLET | ORAL | 2 refills | Status: DC

## 2022-08-31 MED ORDER — AMLODIPINE BESYLATE 5 MG PO TABS: 5.0000 mg | ORAL_TABLET | Freq: Every day | ORAL | 1 refills | Status: DC

## 2022-08-31 NOTE — Progress Notes (Signed)
[facility] [address] [place], [place] [address]  Internal MEDICINE  Office Visit Note  Patient Name: Timothy Moore  [id number]  [id number]  Date of Service: 08/31/2022  Chief Complaint  Patient presents with   Gastroesophageal Reflux   Hypertension   Follow-up    HPI Timothy Moore presents for a follow-up visit for hypertension and back pain Sciatica was aggravated after a long car ride but is better today, takes OTC ibuprofen or tylenol if needed. Need to have vision checked.  Blood pressure is controlled with current medication.  GERD -- takes omeprazole Due for refills of sildenafil    Current Medication: Outpatient Encounter Medications as of 08/31/2022  Medication Sig   cetirizine (ZYRTEC ALLERGY) 10 MG tablet Take 1 tablet (10 mg total) by mouth daily.   ibuprofen (ADVIL) 800 MG tablet Take 1 tablet (800 mg total) by mouth every 8 (eight) hours as needed.   loperamide (IMODIUM) 2 MG capsule Take 1 capsule (2 mg total) by mouth 4 (four) times daily as needed for diarrhea or loose stools.   Multiple Vitamin (MULTIVITAMIN) tablet Take 1 tablet by mouth daily.   naproxen (NAPROSYN) 500 MG tablet Take 1 tablet (500 mg total) by mouth 2 (two) times daily as needed (pain).   [DISCONTINUED] amLODipine (NORVASC) 5 MG tablet Take 1 tablet (5 mg total) by mouth daily.   [DISCONTINUED] omeprazole (PRILOSEC) 20 MG capsule Take 1 capsule (20 mg total) by mouth daily.   [DISCONTINUED] sildenafil (VIAGRA) 100 MG tablet TAKE 1/2 - 1 TABLET BY MOUTH DAILY AS NEEDED FOR ERECTILE DYSFUNCTION   amLODipine (NORVASC) 5 MG tablet Take 1 tablet (5 mg total) by mouth daily.   omeprazole (PRILOSEC) 20 MG capsule Take 1 capsule (20 mg total) by mouth daily.   sildenafil (VIAGRA) 100 MG tablet TAKE 1/2 - 1 TABLET BY MOUTH DAILY AS NEEDED FOR ERECTILE DYSFUNCTION   No facility-administered encounter medications on file as of 08/31/2022.    Surgical History: Past Surgical History:   Procedure Laterality Date   COLONOSCOPY WITH PROPOFOL N/A 05/29/2020   Procedure: COLONOSCOPY WITH PROPOFOL;  Surgeon: Pasty Spillers, MD;  Location: [facility];  Service: Endoscopy;  Laterality: N/A;   ESOPHAGOGASTRODUODENOSCOPY (EGD) WITH PROPOFOL N/A 05/29/2020   Procedure: ESOPHAGOGASTRODUODENOSCOPY (EGD) WITH PROPOFOL;  Surgeon: Pasty Spillers, MD;  Location: [facility];  Service: Endoscopy;  Laterality: N/A;   ESOPHAGOGASTRODUODENOSCOPY (EGD) WITH PROPOFOL N/A 03/12/2021   Procedure: ESOPHAGOGASTRODUODENOSCOPY (EGD) WITH PROPOFOL;  Surgeon: Pasty Spillers, MD;  Location: [facility];  Service: Endoscopy;  Laterality: N/A;   HAND SURGERY Left     Medical History: Past Medical History:  Diagnosis Date   GERD (gastroesophageal reflux disease)    Hypertension     Family History: Family History  Problem Relation Age of Onset   Kidney Stones Mother    Cancer Father    Lupus Sister    Cancer Maternal Grandmother    Cancer Paternal Grandmother     Social History   Socioeconomic History   Marital status: Single    Spouse name: Not on file   Number of children: Not on file   Years of education: Not on file   Highest education level: Not on file  Occupational History   Not on file  Tobacco Use   Smoking status: Former    Types: Cigarettes    Quit date: 03/14/2009    Years since quitting: 13.4   Smokeless tobacco: Never   Tobacco comments:  quit 11 years ago   Vaping Use   Vaping Use: Never used  Substance and Sexual Activity   Alcohol use: Yes    Comment: 2 shots a month   Drug use: Never    Comment: marijuana 20 years ago   Sexual activity: Yes    Birth control/protection: Condom  Other Topics Concern   Not on file  Social History Narrative   Not on file   Social Determinants of Health   Financial Resource Strain: Not on file  Food Insecurity: Not on file  Transportation Needs: Not on file  Physical Activity: Not on file   Stress: Not on file  Social Connections: Not on file  Intimate Partner Violence: Not At Risk (12/13/2021)   Humiliation, Afraid, Rape, and Kick questionnaire    Fear of Current or Ex-Partner: No    Emotionally Abused: No    Physically Abused: No    Sexually Abused: No      Review of Systems  Constitutional:  Negative for chills, fatigue and unexpected weight change.  HENT:  Negative for congestion, rhinorrhea, sneezing and sore throat.   Eyes:  Negative for redness.  Respiratory:  Negative for cough, chest tightness and shortness of breath.   Cardiovascular:  Negative for chest pain and palpitations.  Gastrointestinal:  Negative for abdominal pain, constipation, diarrhea, nausea and vomiting.  Genitourinary:  Negative for dysuria and frequency.  Musculoskeletal:  Negative for arthralgias, back pain, joint swelling and neck pain.  Skin:  Negative for rash.  Neurological: Negative.  Negative for tremors and numbness.  Hematological:  Negative for adenopathy. Does not bruise/bleed easily.  Psychiatric/Behavioral:  Negative for behavioral problems (Depression), sleep disturbance and suicidal ideas. The patient is not nervous/anxious.     Vital Signs: BP 135/70 Comment: 132/90  Pulse 83   Temp 98.3 F (36.8 C)   Resp 16   Ht 5\' 5"  (1.651 m)   Wt 166 lb 3.2 oz (75.4 kg)   SpO2 96%   BMI 27.66 kg/m    Physical Exam Vitals reviewed.  Constitutional:      General: Timothy Moore is not in acute distress.    Appearance: Normal appearance. Timothy Moore is not ill-appearing.  HENT:     Head: Normocephalic and atraumatic.  Eyes:     Pupils: Pupils are equal, round, and reactive to light.  Cardiovascular:     Rate and Rhythm: Normal rate and regular rhythm.  Pulmonary:     Effort: Pulmonary effort is normal. No respiratory distress.  Neurological:     Mental Status: Timothy Moore is alert and oriented to person, place, and time.  Psychiatric:        Mood and Affect: Mood normal.        Behavior: Behavior  normal.        Assessment/Plan: 1. Essential hypertension Continue amlodipine as prescribed.  - amLODipine (NORVASC) 5 MG tablet; Take 1 tablet (5 mg total) by mouth daily.  Dispense: 90 tablet; Refill: 1  2. Gastroesophageal reflux disease without esophagitis Continue omeprazole as prescribed.  - omeprazole (PRILOSEC) 20 MG capsule; Take 1 capsule (20 mg total) by mouth daily.  Dispense: 90 capsule; Refill: 1  3. Vision disturbance Referred to ophthalmology - Ambulatory referral to Ophthalmology  4. Other male erectile dysfunction Continue prn sildenafil as prescribed.  - sildenafil (VIAGRA) 100 MG tablet; TAKE 1/2 - 1 TABLET BY MOUTH DAILY AS NEEDED FOR ERECTILE DYSFUNCTION  Dispense: 10 tablet; Refill: 2   General Counseling: Timothy Moore understanding of the  findings of todays visit and agrees with plan of treatment. I have discussed any further diagnostic evaluation that may be needed or ordered today. We also reviewed his medications today. Timothy Moore has been encouraged to call the office with any questions or concerns that should arise related to todays visit.    Orders Placed This Encounter  Procedures   Ambulatory referral to Ophthalmology    Meds ordered this encounter  Medications   sildenafil (VIAGRA) 100 MG tablet    Sig: TAKE 1/2 - 1 TABLET BY MOUTH DAILY AS NEEDED FOR ERECTILE DYSFUNCTION    Dispense:  10 tablet    Refill:  2   amLODipine (NORVASC) 5 MG tablet    Sig: Take 1 tablet (5 mg total) by mouth daily.    Dispense:  90 tablet    Refill:  1   omeprazole (PRILOSEC) 20 MG capsule    Sig: Take 1 capsule (20 mg total) by mouth daily.    Dispense:  90 capsule    Refill:  1    Return in about 2 months (around 10/31/2022) for F/U, BP check, Jonnelle Lawniczak PCP.   Total time spent:30 Minutes Time spent includes review of chart, medications, test results, and follow up plan with the patient.   [place] Controlled Substance Database was reviewed by me.  This  patient was seen by Sallyanne Kuster, FNP-C in collaboration with Dr. Beverely Risen as a part of collaborative care agreement.   Teryn Boerema R. Tedd Sias, MSN, FNP-C Internal medicine

## 2022-09-01 ENCOUNTER — Encounter: Payer: Self-pay | Admitting: Nurse Practitioner

## 2022-09-01 ENCOUNTER — Telehealth: Payer: Self-pay | Admitting: Nurse Practitioner

## 2022-09-01 NOTE — Telephone Encounter (Signed)
Awaiting 08/31/22 office notes for Ophthalmology referral-[facility] 

## 2022-09-02 ENCOUNTER — Telehealth: Payer: Self-pay | Admitting: Nurse Practitioner

## 2022-09-02 NOTE — Telephone Encounter (Signed)
Ophthalmology referral sent via [facility] to [facility] 

## 2022-09-22 ENCOUNTER — Ambulatory Visit: Payer: BC Managed Care – PPO | Admitting: Nurse Practitioner

## 2022-09-22 ENCOUNTER — Encounter: Payer: Self-pay | Admitting: Nurse Practitioner

## 2022-09-22 VITALS — BP 120/72 | HR 86 | Temp 98.5°F | Resp 16 | Ht 65.0 in | Wt 166.2 lb

## 2022-09-22 DIAGNOSIS — M5441 Lumbago with sciatica, right side: Secondary | ICD-10-CM

## 2022-09-22 DIAGNOSIS — M5442 Lumbago with sciatica, left side: Secondary | ICD-10-CM | POA: Diagnosis not present

## 2022-09-22 DIAGNOSIS — M722 Plantar fascial fibromatosis: Secondary | ICD-10-CM

## 2022-09-22 MED ORDER — CYCLOBENZAPRINE HCL 10 MG PO TABS
10.0000 mg | ORAL_TABLET | Freq: Every evening | ORAL | 2 refills | Status: DC | PRN
Start: 2022-09-22 — End: 2023-01-03

## 2022-09-22 NOTE — Progress Notes (Signed)
[facility] [address] [place], [place] [address]  Internal MEDICINE  Office Visit Note  Patient Name: Timothy Moore  [id number]  [id number]  Date of Service: 09/22/2022  Chief Complaint  Patient presents with   Acute Visit    Back pain     HPI Timothy Moore presents for an acute sick visit for low back pain flare up and bilateral foot pain Foot pain started for a month or 2, has been getting worse, hurts especially in the morning, tender on the heel, bought new shoes because of it. Tends to come up and then go away every few months. Low back pain described as a nagging pain, not severe but bothersome. Similar to back pain from prior office visits.    Current Medication:  Outpatient Encounter Medications as of 09/22/2022  Medication Sig   cyclobenzaprine (FLEXERIL) 10 MG tablet Take 1 tablet (10 mg total) by mouth at bedtime as needed for muscle spasms.   amLODipine (NORVASC) 5 MG tablet Take 1 tablet (5 mg total) by mouth daily.   cetirizine (ZYRTEC ALLERGY) 10 MG tablet Take 1 tablet (10 mg total) by mouth daily.   ibuprofen (ADVIL) 800 MG tablet Take 1 tablet (800 mg total) by mouth every 8 (eight) hours as needed.   loperamide (IMODIUM) 2 MG capsule Take 1 capsule (2 mg total) by mouth 4 (four) times daily as needed for diarrhea or loose stools.   Multiple Vitamin (MULTIVITAMIN) tablet Take 1 tablet by mouth daily.   naproxen (NAPROSYN) 500 MG tablet Take 1 tablet (500 mg total) by mouth 2 (two) times daily as needed (pain).   omeprazole (PRILOSEC) 20 MG capsule Take 1 capsule (20 mg total) by mouth daily.   sildenafil (VIAGRA) 100 MG tablet TAKE 1/2 - 1 TABLET BY MOUTH DAILY AS NEEDED FOR ERECTILE DYSFUNCTION   No facility-administered encounter medications on file as of 09/22/2022.      Medical History: Past Medical History:  Diagnosis Date   GERD (gastroesophageal reflux disease)    Hypertension      Vital Signs: BP 120/72 Comment: 150/90  Pulse 86    Temp 98.5 F (36.9 C)   Resp 16   Ht 5\' 5"  (1.651 m)   Wt 166 lb 3.2 oz (75.4 kg)   SpO2 95%   BMI 27.66 kg/m    Review of Systems  Constitutional:  Negative for chills, fatigue and unexpected weight change.  HENT:  Negative for congestion, rhinorrhea, sneezing and sore throat.   Eyes:  Negative for redness.  Respiratory:  Negative for cough, chest tightness and shortness of breath.   Cardiovascular:  Negative for chest pain and palpitations.  Gastrointestinal:  Negative for abdominal pain, constipation, diarrhea, nausea and vomiting.  Genitourinary:  Negative for dysuria and frequency.  Musculoskeletal:  Positive for arthralgias, back pain and myalgias. Negative for joint swelling and neck pain.  Skin:  Negative for rash.  Neurological: Negative.  Negative for tremors and numbness.  Hematological:  Negative for adenopathy. Does not bruise/bleed easily.  Psychiatric/Behavioral:  Negative for behavioral problems (Depression), sleep disturbance and suicidal ideas. The patient is not nervous/anxious.     Physical Exam Vitals reviewed.  Constitutional:      General: He is not in acute distress.    Appearance: Normal appearance. He is not ill-appearing.  HENT:     Head: Normocephalic and atraumatic.  Eyes:     Pupils: Pupils are equal, round, and reactive to light.  Cardiovascular:     Rate  and Rhythm: Normal rate and regular rhythm.  Pulmonary:     Effort: Pulmonary effort is normal. No respiratory distress.  Neurological:     Mental Status: He is alert and oriented to person, place, and time.  Psychiatric:        Mood and Affect: Mood normal.        Behavior: Behavior normal.       Assessment/Plan: 1. Acute midline low back pain with bilateral sciatica Take cyclobenzaprine as needed, may also take ibuprofen as needed  - cyclobenzaprine (FLEXERIL) 10 MG tablet; Take 1 tablet (10 mg total) by mouth at bedtime as needed for muscle spasms.  Dispense: 30 tablet; Refill:  2  2. Plantar fasciitis, bilateral Take cyclobenzaprine and ibuprofen as needed and try out stretches and other interventions as discussed.  - cyclobenzaprine (FLEXERIL) 10 MG tablet; Take 1 tablet (10 mg total) by mouth at bedtime as needed for muscle spasms.  Dispense: 30 tablet; Refill: 2   General Counseling: Timothy Moore understanding of the findings of todays visit and agrees with plan of treatment. I have discussed any further diagnostic evaluation that may be needed or ordered today. We also reviewed his medications today. he has been encouraged to call the office with any questions or concerns that should arise related to todays visit.    Counseling:    No orders of the defined types were placed in this encounter.   Meds ordered this encounter  Medications   cyclobenzaprine (FLEXERIL) 10 MG tablet    Sig: Take 1 tablet (10 mg total) by mouth at bedtime as needed for muscle spasms.    Dispense:  30 tablet    Refill:  2    Return if symptoms worsen or fail to improve.  [place] Controlled Substance Database was reviewed by me for overdose risk score (ORS)  Time spent:20 Minutes Time spent with patient included reviewing progress notes, labs, imaging studies, and discussing plan for follow up.   This patient was seen by Timothy Kuster, FNP-C in collaboration with Dr. Beverely Risen as a part of collaborative care agreement.  Timothy Trego R. Tedd Sias, MSN, FNP-C Internal Medicine

## 2022-10-04 ENCOUNTER — Telehealth: Payer: Self-pay | Admitting: Nurse Practitioner

## 2022-10-04 NOTE — Telephone Encounter (Signed)
Ophthalmology appointment 10/18/2022 @ [facility]

## 2022-11-01 ENCOUNTER — Ambulatory Visit (INDEPENDENT_AMBULATORY_CARE_PROVIDER_SITE_OTHER): Payer: BC Managed Care – PPO | Admitting: Nurse Practitioner

## 2022-11-01 ENCOUNTER — Encounter: Payer: Self-pay | Admitting: Nurse Practitioner

## 2022-11-01 ENCOUNTER — Ambulatory Visit
Admission: RE | Admit: 2022-11-01 | Discharge: 2022-11-01 | Disposition: A | Discharge status: Home / Self Care | Payer: BC Managed Care – PPO | Source: Ambulatory Visit | Attending: Nurse Practitioner | Admitting: Nurse Practitioner

## 2022-11-01 ENCOUNTER — Ambulatory Visit
Admission: RE | Admit: 2022-11-01 | Discharge: 2022-11-01 | Disposition: A | Discharge status: Home / Self Care | Payer: BC Managed Care – PPO | Attending: Nurse Practitioner | Admitting: Nurse Practitioner

## 2022-11-01 VITALS — BP 120/80 | HR 71 | Temp 98.4°F | Resp 16 | Ht 65.0 in | Wt 169.0 lb

## 2022-11-01 DIAGNOSIS — M7989 Other specified soft tissue disorders: Secondary | ICD-10-CM | POA: Diagnosis not present

## 2022-11-01 DIAGNOSIS — M25572 Pain in left ankle and joints of left foot: Secondary | ICD-10-CM

## 2022-11-01 DIAGNOSIS — M25472 Effusion, left ankle: Secondary | ICD-10-CM

## 2022-11-01 DIAGNOSIS — I10 Essential (primary) hypertension: Secondary | ICD-10-CM

## 2022-11-01 NOTE — Progress Notes (Signed)
[facility] [address] [place], [place] [address]  Internal MEDICINE  Office Visit Note  Patient Name: Timothy Moore  [id number]  [id number]  Date of Service: 11/01/2022  Chief Complaint  Patient presents with   Gastroesophageal Reflux   Hypertension   Follow-up   Ankle Pain    Left ankle pain since Friday    HPI Nova presents for a follow-up visit for hypertension and left ankle pain.  Hypertension -- controlled with current medication, no changes Left ankle pain and swelling -- wearing a brace, tenderness with palpation of the lateral aspect of the left ankle     Current Medication: Outpatient Encounter Medications as of 11/01/2022  Medication Sig   amLODipine (NORVASC) 5 MG tablet Take 1 tablet (5 mg total) by mouth daily.   cetirizine (ZYRTEC ALLERGY) 10 MG tablet Take 1 tablet (10 mg total) by mouth daily.   cyclobenzaprine (FLEXERIL) 10 MG tablet Take 1 tablet (10 mg total) by mouth at bedtime as needed for muscle spasms.   ibuprofen (ADVIL) 800 MG tablet Take 1 tablet (800 mg total) by mouth every 8 (eight) hours as needed.   loperamide (IMODIUM) 2 MG capsule Take 1 capsule (2 mg total) by mouth 4 (four) times daily as needed for diarrhea or loose stools.   Multiple Vitamin (MULTIVITAMIN) tablet Take 1 tablet by mouth daily.   naproxen (NAPROSYN) 500 MG tablet Take 1 tablet (500 mg total) by mouth 2 (two) times daily as needed (pain).   omeprazole (PRILOSEC) 20 MG capsule Take 1 capsule (20 mg total) by mouth daily.   sildenafil (VIAGRA) 100 MG tablet TAKE 1/2 - 1 TABLET BY MOUTH DAILY AS NEEDED FOR ERECTILE DYSFUNCTION   No facility-administered encounter medications on file as of 11/01/2022.    Surgical History: Past Surgical History:  Procedure Laterality Date   COLONOSCOPY WITH PROPOFOL N/A 05/29/2020   Procedure: COLONOSCOPY WITH PROPOFOL;  Surgeon: Pasty Spillers, MD;  Location: [facility];  Service: Endoscopy;  Laterality: N/A;    ESOPHAGOGASTRODUODENOSCOPY (EGD) WITH PROPOFOL N/A 05/29/2020   Procedure: ESOPHAGOGASTRODUODENOSCOPY (EGD) WITH PROPOFOL;  Surgeon: Pasty Spillers, MD;  Location: [facility];  Service: Endoscopy;  Laterality: N/A;   ESOPHAGOGASTRODUODENOSCOPY (EGD) WITH PROPOFOL N/A 03/12/2021   Procedure: ESOPHAGOGASTRODUODENOSCOPY (EGD) WITH PROPOFOL;  Surgeon: Pasty Spillers, MD;  Location: [facility];  Service: Endoscopy;  Laterality: N/A;   HAND SURGERY Left     Medical History: Past Medical History:  Diagnosis Date   GERD (gastroesophageal reflux disease)    Hypertension     Family History: Family History  Problem Relation Age of Onset   Kidney Stones Mother    Cancer Father    Lupus Sister    Cancer Maternal Grandmother    Cancer Paternal Grandmother     Social History   Socioeconomic History   Marital status: Single    Spouse name: Not on file   Number of children: Not on file   Years of education: Not on file   Highest education level: Not on file  Occupational History   Not on file  Tobacco Use   Smoking status: Former    Current packs/day: 0.00    Types: Cigarettes    Quit date: 03/14/2009    Years since quitting: 13.6   Smokeless tobacco: Never   Tobacco comments:    quit 11 years ago   Vaping Use   Vaping status: Never Used  Substance and Sexual Activity   Alcohol use: Yes  Comment: 2 shots a month   Drug use: Never    Comment: marijuana 20 years ago   Sexual activity: Yes    Birth control/protection: Condom  Other Topics Concern   Not on file  Social History Narrative   Not on file   Social Determinants of Health   Financial Resource Strain: Not on file  Food Insecurity: Not on file  Transportation Needs: Not on file  Physical Activity: Not on file  Stress: Not on file  Social Connections: Not on file  Intimate Partner Violence: Not At Risk (12/13/2021)   Humiliation, Afraid, Rape, and Kick questionnaire    Fear of Current or  Ex-Partner: No    Emotionally Abused: No    Physically Abused: No    Sexually Abused: No      Review of Systems  Constitutional:  Negative for chills, fatigue and unexpected weight change.  HENT:  Negative for congestion, rhinorrhea, sneezing and sore throat.   Eyes:  Negative for redness.  Respiratory:  Negative for cough, chest tightness and shortness of breath.   Cardiovascular:  Negative for chest pain and palpitations.  Gastrointestinal:  Negative for abdominal pain, constipation, diarrhea, nausea and vomiting.  Genitourinary:  Negative for dysuria and frequency.  Musculoskeletal:  Positive for arthralgias (acute left ankle) and joint swelling (left ankle acute mild). Negative for back pain and neck pain.  Skin:  Negative for rash.  Neurological: Negative.  Negative for tremors and numbness.  Hematological:  Negative for adenopathy. Does not bruise/bleed easily.  Psychiatric/Behavioral:  Negative for behavioral problems (Depression), sleep disturbance and suicidal ideas. The patient is not nervous/anxious.     Vital Signs: BP 120/80   Pulse 71   Temp 98.4 F (36.9 C)   Resp 16   Ht 5\' 5"  (1.651 m)   Wt 169 lb (76.7 kg)   SpO2 96%   BMI 28.12 kg/m    Physical Exam Vitals reviewed.  Constitutional:      General: He is not in acute distress.    Appearance: Normal appearance. He is not ill-appearing.  HENT:     Head: Normocephalic and atraumatic.  Eyes:     Pupils: Pupils are equal, round, and reactive to light.  Cardiovascular:     Rate and Rhythm: Normal rate and regular rhythm.  Pulmonary:     Effort: Pulmonary effort is normal. No respiratory distress.  Musculoskeletal:     Right ankle: Normal.     Right Achilles Tendon: Normal.     Left ankle: Swelling present. Tenderness present over the lateral malleolus. Decreased range of motion.     Left Achilles Tendon: Normal.  Neurological:     Mental Status: He is alert and oriented to person, place, and time.   Psychiatric:        Mood and Affect: Mood normal.        Behavior: Behavior normal.        Assessment/Plan: 1. Acute left ankle pain Xray ordered to rule out ankle fracture - DG Ankle Complete Left; Future  2. Left ankle swelling Xray ordered to rule out ankle fracture - DG Ankle Complete Left; Future  3. Essential hypertension Stable, continue medication as prescribed.    General Counseling: cire cauthon understanding of the findings of todays visit and agrees with plan of treatment. I have discussed any further diagnostic evaluation that may be needed or ordered today. We also reviewed his medications today. he has been encouraged to call the office with any questions or  concerns that should arise related to todays visit.    Orders Placed This Encounter  Procedures   DG Ankle Complete Left    No orders of the defined types were placed in this encounter.   Return in about 2 months (around 01/01/2023) for F/U, BP check, Naydene Kamrowski PCP.   Total time spent:30 Minutes Time spent includes review of chart, medications, test results, and follow up plan with the patient.   [place] Controlled Substance Database was reviewed by me.  This patient was seen by Sallyanne Kuster, FNP-C in collaboration with Dr. Beverely Risen as a part of collaborative care agreement.   Maribell Demeo R. Tedd Sias, MSN, FNP-C Internal medicine

## 2022-11-06 ENCOUNTER — Encounter: Payer: Self-pay | Admitting: Nurse Practitioner

## 2022-11-19 ENCOUNTER — Other Ambulatory Visit: Payer: Self-pay | Admitting: Nurse Practitioner

## 2022-11-19 DIAGNOSIS — N528 Other male erectile dysfunction: Secondary | ICD-10-CM

## 2022-12-08 ENCOUNTER — Encounter: Payer: Self-pay | Admitting: Nurse Practitioner

## 2022-12-09 ENCOUNTER — Encounter: Payer: Self-pay | Admitting: Nurse Practitioner

## 2022-12-09 ENCOUNTER — Telehealth (INDEPENDENT_AMBULATORY_CARE_PROVIDER_SITE_OTHER): Payer: BC Managed Care – PPO | Admitting: Nurse Practitioner

## 2022-12-09 VITALS — Ht 65.0 in | Wt 169.0 lb

## 2022-12-09 DIAGNOSIS — T17908A Unspecified foreign body in respiratory tract, part unspecified causing other injury, initial encounter: Secondary | ICD-10-CM | POA: Diagnosis not present

## 2022-12-09 DIAGNOSIS — W16012A Fall into swimming pool striking water surface causing other injury, initial encounter: Secondary | ICD-10-CM | POA: Diagnosis not present

## 2022-12-09 NOTE — Progress Notes (Signed)
[facility] [address] [place], [place] [address]  Internal MEDICINE  Telephone Visit  Patient Name: Timothy Moore  [id number]  [id number]  Date of Service: 12/09/2022  I connected with the patient at 1055 by telephone and verified the patients identity using two identifiers.   I discussed the limitations, risks, security and privacy concerns of performing an evaluation and management service by telephone and the availability of in person appointments. I also discussed with the patient that there may be a patient responsible charge related to the service.  The patient expressed understanding and agrees to proceed.    Chief Complaint  Patient presents with   Telephone Screen   Telephone Assessment     fell in pool. swallowed water    HPI Kathy presents for a telehealth virtual visit for patient fell in pool and swallowed water.  Felt bad yesterday after that--fatigue and some cough.  Concern for aspiration of dirty pool water.   Current Medication: Outpatient Encounter Medications as of 12/09/2022  Medication Sig   amLODipine (NORVASC) 5 MG tablet Take 1 tablet (5 mg total) by mouth daily.   cetirizine (ZYRTEC ALLERGY) 10 MG tablet Take 1 tablet (10 mg total) by mouth daily.   cyclobenzaprine (FLEXERIL) 10 MG tablet Take 1 tablet (10 mg total) by mouth at bedtime as needed for muscle spasms.   ibuprofen (ADVIL) 800 MG tablet Take 1 tablet (800 mg total) by mouth every 8 (eight) hours as needed.   loperamide (IMODIUM) 2 MG capsule Take 1 capsule (2 mg total) by mouth 4 (four) times daily as needed for diarrhea or loose stools.   Multiple Vitamin (MULTIVITAMIN) tablet Take 1 tablet by mouth daily.   naproxen (NAPROSYN) 500 MG tablet Take 1 tablet (500 mg total) by mouth 2 (two) times daily as needed (pain).   omeprazole (PRILOSEC) 20 MG capsule Take 1 capsule (20 mg total) by mouth daily.   sildenafil (VIAGRA) 100 MG tablet TAKE 1/2 TO 1 TABLET BY MOUTH DAILY AS NEEDED  FOR ERECTILE DYSFUNCTION   No facility-administered encounter medications on file as of 12/09/2022.    Surgical History: Past Surgical History:  Procedure Laterality Date   COLONOSCOPY WITH PROPOFOL N/A 05/29/2020   Procedure: COLONOSCOPY WITH PROPOFOL;  Surgeon: Pasty Spillers, MD;  Location: [facility];  Service: Endoscopy;  Laterality: N/A;   ESOPHAGOGASTRODUODENOSCOPY (EGD) WITH PROPOFOL N/A 05/29/2020   Procedure: ESOPHAGOGASTRODUODENOSCOPY (EGD) WITH PROPOFOL;  Surgeon: Pasty Spillers, MD;  Location: [facility];  Service: Endoscopy;  Laterality: N/A;   ESOPHAGOGASTRODUODENOSCOPY (EGD) WITH PROPOFOL N/A 03/12/2021   Procedure: ESOPHAGOGASTRODUODENOSCOPY (EGD) WITH PROPOFOL;  Surgeon: Pasty Spillers, MD;  Location: [facility];  Service: Endoscopy;  Laterality: N/A;   HAND SURGERY Left     Medical History: Past Medical History:  Diagnosis Date   GERD (gastroesophageal reflux disease)    Hypertension     Family History: Family History  Problem Relation Age of Onset   Kidney Stones Mother    Cancer Father    Lupus Sister    Cancer Maternal Grandmother    Cancer Paternal Grandmother     Social History   Socioeconomic History   Marital status: Single    Spouse name: Not on file   Number of children: Not on file   Years of education: Not on file   Highest education level: Not on file  Occupational History   Not on file  Tobacco Use   Smoking status: Former    Current packs/day:  0.00    Types: Cigarettes    Quit date: 03/14/2009    Years since quitting: 13.7   Smokeless tobacco: Never   Tobacco comments:    quit 11 years ago   Vaping Use   Vaping status: Never Used  Substance and Sexual Activity   Alcohol use: Yes    Comment: 2 shots a month   Drug use: Never    Comment: marijuana 20 years ago   Sexual activity: Yes    Birth control/protection: Condom  Other Topics Concern   Not on file  Social History Narrative   Not on file    Social Determinants of Health   Financial Resource Strain: Not on file  Food Insecurity: Not on file  Transportation Needs: Not on file  Physical Activity: Not on file  Stress: Not on file  Social Connections: Not on file  Intimate Partner Violence: Not At Risk (12/13/2021)   Humiliation, Afraid, Rape, and Kick questionnaire    Fear of Current or Ex-Partner: No    Emotionally Abused: No    Physically Abused: No    Sexually Abused: No      Review of Systems  Constitutional:  Positive for fatigue.  Respiratory:  Positive for cough (some yesterday after getting out of the pool). Negative for chest tightness, shortness of breath and wheezing.   Cardiovascular: Negative.  Negative for chest pain and palpitations.    Vital Signs: Ht 5\' 5"  (1.651 m)   Wt 169 lb (76.7 kg)   BMI 28.12 kg/m    Observation/Objective: He is alert and oriented. No acute distress noted.    Assessment/Plan: 1. Aspiration into respiratory tract, initial encounter Chest xray ordered to rule out aspiration and/or aspiration pneumonia.  - DG Chest 2 View; Future  2. Accident caused by fall into swimming pool, initial encounter Larey Seat into a pool with dirty water, may have aspiration the water into his lungs.    General Counseling: dajour pierpoint understanding of the findings of today's phone visit and agrees with plan of treatment. I have discussed any further diagnostic evaluation that may be needed or ordered today. We also reviewed his medications today. he has been encouraged to call the office with any questions or concerns that should arise related to todays visit.  Return if symptoms worsen or fail to improve.   Orders Placed This Encounter  Procedures   DG Chest 2 View    No orders of the defined types were placed in this encounter.   Time spent:10 Minutes Time spent with patient included reviewing progress notes, labs, imaging studies, and discussing plan for follow up.  [place]  Controlled Substance Database was reviewed by me for overdose risk score (ORS) if appropriate.  This patient was seen by Sallyanne Kuster, FNP-C in collaboration with Dr. Beverely Risen as a part of collaborative care agreement.  Machai Desmith R. Tedd Sias, MSN, FNP-C Internal medicine

## 2022-12-18 ENCOUNTER — Other Ambulatory Visit: Payer: Self-pay | Admitting: Nurse Practitioner

## 2022-12-18 DIAGNOSIS — N528 Other male erectile dysfunction: Secondary | ICD-10-CM

## 2023-01-03 ENCOUNTER — Encounter: Payer: Self-pay | Admitting: Nurse Practitioner

## 2023-01-03 ENCOUNTER — Ambulatory Visit (INDEPENDENT_AMBULATORY_CARE_PROVIDER_SITE_OTHER): Payer: BC Managed Care – PPO | Admitting: Nurse Practitioner

## 2023-01-03 VITALS — BP 134/80 | HR 88 | Temp 98.7°F | Resp 16 | Ht 65.0 in | Wt 171.4 lb

## 2023-01-03 DIAGNOSIS — I10 Essential (primary) hypertension: Secondary | ICD-10-CM

## 2023-01-03 DIAGNOSIS — M5442 Lumbago with sciatica, left side: Secondary | ICD-10-CM | POA: Diagnosis not present

## 2023-01-03 DIAGNOSIS — M722 Plantar fascial fibromatosis: Secondary | ICD-10-CM

## 2023-01-03 DIAGNOSIS — K219 Gastro-esophageal reflux disease without esophagitis: Secondary | ICD-10-CM | POA: Diagnosis not present

## 2023-01-03 DIAGNOSIS — M5441 Lumbago with sciatica, right side: Secondary | ICD-10-CM

## 2023-01-03 MED ORDER — CYCLOBENZAPRINE HCL 10 MG PO TABS: 10.0000 mg | ORAL_TABLET | Freq: Every evening | ORAL | 2 refills | Status: DC | PRN

## 2023-01-03 MED ORDER — IBUPROFEN 800 MG PO TABS: 800.0000 mg | ORAL_TABLET | Freq: Three times a day (TID) | ORAL | 0 refills | Status: DC | PRN

## 2023-01-03 MED ORDER — AMLODIPINE BESYLATE 5 MG PO TABS
5.0000 mg | ORAL_TABLET | Freq: Every day | ORAL | 1 refills | Status: DC
Start: 2023-01-03 — End: 2023-02-21

## 2023-01-03 MED ORDER — OMEPRAZOLE 20 MG PO CPDR
20.0000 mg | DELAYED_RELEASE_CAPSULE | Freq: Every day | ORAL | 1 refills | Status: DC
Start: 2023-01-03 — End: 2023-02-21

## 2023-01-03 NOTE — Progress Notes (Signed)
[facility] [address] [place], [place] [address]  Internal MEDICINE  Office Visit Note  Patient Name: Timothy Moore  [id number]  [id number]  Date of Service: 01/03/2023  Chief Complaint  Patient presents with   Gastroesophageal Reflux   Hypertension   Follow-up    HPI Timothy Moore presents for a follow-up visit for hypertension, GERD and foot pain Hypertension -- controlled with current medications Foot pain bilaterally-- Got custom insoles and not helping, wants to try seeing a different foot doctor.  GERD -- taking omeprazole which controls his symptoms well.  Also having more low back pain     Current Medication: Outpatient Encounter Medications as of 01/03/2023  Medication Sig   cetirizine (ZYRTEC ALLERGY) 10 MG tablet Take 1 tablet (10 mg total) by mouth daily.   loperamide (IMODIUM) 2 MG capsule Take 1 capsule (2 mg total) by mouth 4 (four) times daily as needed for diarrhea or loose stools.   Multiple Vitamin (MULTIVITAMIN) tablet Take 1 tablet by mouth daily.   naproxen (NAPROSYN) 500 MG tablet Take 1 tablet (500 mg total) by mouth 2 (two) times daily as needed (pain).   sildenafil (VIAGRA) 100 MG tablet TAKE 1/2 TO 1 TABLET BY MOUTH DAILY AS NEEDED FOR ERECTILE DYSFUNCTION   [DISCONTINUED] amLODipine (NORVASC) 5 MG tablet Take 1 tablet (5 mg total) by mouth daily.   [DISCONTINUED] cyclobenzaprine (FLEXERIL) 10 MG tablet Take 1 tablet (10 mg total) by mouth at bedtime as needed for muscle spasms.   [DISCONTINUED] ibuprofen (ADVIL) 800 MG tablet Take 1 tablet (800 mg total) by mouth every 8 (eight) hours as needed.   [DISCONTINUED] omeprazole (PRILOSEC) 20 MG capsule Take 1 capsule (20 mg total) by mouth daily.   amLODipine (NORVASC) 5 MG tablet Take 1 tablet (5 mg total) by mouth daily.   cyclobenzaprine (FLEXERIL) 10 MG tablet Take 1 tablet (10 mg total) by mouth at bedtime as needed for muscle spasms.   ibuprofen (ADVIL) 800 MG tablet Take 1 tablet (800 mg  total) by mouth every 8 (eight) hours as needed.   omeprazole (PRILOSEC) 20 MG capsule Take 1 capsule (20 mg total) by mouth daily.   No facility-administered encounter medications on file as of 01/03/2023.    Surgical History: Past Surgical History:  Procedure Laterality Date   COLONOSCOPY WITH PROPOFOL N/A 05/29/2020   Procedure: COLONOSCOPY WITH PROPOFOL;  Surgeon: Pasty Spillers, MD;  Location: [facility];  Service: Endoscopy;  Laterality: N/A;   ESOPHAGOGASTRODUODENOSCOPY (EGD) WITH PROPOFOL N/A 05/29/2020   Procedure: ESOPHAGOGASTRODUODENOSCOPY (EGD) WITH PROPOFOL;  Surgeon: Pasty Spillers, MD;  Location: [facility];  Service: Endoscopy;  Laterality: N/A;   ESOPHAGOGASTRODUODENOSCOPY (EGD) WITH PROPOFOL N/A 03/12/2021   Procedure: ESOPHAGOGASTRODUODENOSCOPY (EGD) WITH PROPOFOL;  Surgeon: Pasty Spillers, MD;  Location: [facility];  Service: Endoscopy;  Laterality: N/A;   HAND SURGERY Left     Medical History: Past Medical History:  Diagnosis Date   GERD (gastroesophageal reflux disease)    Hypertension     Family History: Family History  Problem Relation Age of Onset   Kidney Stones Mother    Cancer Father    Lupus Sister    Cancer Maternal Grandmother    Cancer Paternal Grandmother     Social History   Socioeconomic History   Marital status: Single    Spouse name: Not on file   Number of children: Not on file   Years of education: Not on file   Highest education level: Not on file  Occupational History   Not on file  Tobacco Use   Smoking status: Former    Current packs/day: 0.00    Types: Cigarettes    Quit date: 03/14/2009    Years since quitting: 13.8   Smokeless tobacco: Never   Tobacco comments:    quit 11 years ago   Vaping Use   Vaping status: Never Used  Substance and Sexual Activity   Alcohol use: Yes    Comment: 2 shots a month   Drug use: Never    Comment: marijuana 20 years ago   Sexual activity: Yes    Birth  control/protection: Condom  Other Topics Concern   Not on file  Social History Narrative   Not on file   Social Determinants of Health   Financial Resource Strain: Not on file  Food Insecurity: Not on file  Transportation Needs: Not on file  Physical Activity: Not on file  Stress: Not on file  Social Connections: Not on file  Intimate Partner Violence: Not At Risk (12/13/2021)   Humiliation, Afraid, Rape, and Kick questionnaire    Fear of Current or Ex-Partner: No    Emotionally Abused: No    Physically Abused: No    Sexually Abused: No      Review of Systems  Constitutional:  Negative for chills, fatigue and unexpected weight change.  HENT:  Negative for congestion, rhinorrhea, sneezing and sore throat.   Eyes:  Negative for redness.  Respiratory:  Negative for cough, chest tightness and shortness of breath.   Cardiovascular:  Negative for chest pain and palpitations.  Gastrointestinal:  Negative for abdominal pain, constipation, diarrhea, nausea and vomiting.  Genitourinary:  Negative for dysuria and frequency.  Musculoskeletal:  Positive for arthralgias, back pain and myalgias. Negative for joint swelling and neck pain.       Bilateral foot pain, plantar fasciitis  Skin:  Negative for rash.  Neurological: Negative.  Negative for tremors and numbness.  Hematological:  Negative for adenopathy. Does not bruise/bleed easily.  Psychiatric/Behavioral:  Negative for behavioral problems (Depression), sleep disturbance and suicidal ideas. The patient is not nervous/anxious.     Vital Signs: BP 134/80 Comment: 142/90  Pulse 88   Temp 98.7 F (37.1 C)   Resp 16   Ht 5\' 5"  (1.651 m)   Wt 171 lb 6.4 oz (77.7 kg)   SpO2 94%   BMI 28.52 kg/m    Physical Exam Vitals reviewed.  Constitutional:      General: He is not in acute distress.    Appearance: Normal appearance. He is not ill-appearing.  HENT:     Head: Normocephalic and atraumatic.  Eyes:     Pupils: Pupils are  equal, round, and reactive to light.  Cardiovascular:     Rate and Rhythm: Normal rate and regular rhythm.  Pulmonary:     Effort: Pulmonary effort is normal. No respiratory distress.  Neurological:     Mental Status: He is alert and oriented to person, place, and time.  Psychiatric:        Mood and Affect: Mood normal.        Behavior: Behavior normal.        Assessment/Plan: 1. Plantar fasciitis, bilateral Referred to podiatry for a second opinion  - Ambulatory referral to Podiatry  2. Essential hypertension Continue amlodipine as prescribed.  - amLODipine (NORVASC) 5 MG tablet; Take 1 tablet (5 mg total) by mouth daily.  Dispense: 90 tablet; Refill: 1  3. Gastroesophageal reflux disease without esophagitis  Continue omeprazole as prescribed.  - omeprazole (PRILOSEC) 20 MG capsule; Take 1 capsule (20 mg total) by mouth daily.  Dispense: 90 capsule; Refill: 1  4. Acute midline low back pain with bilateral sciatica Take ibuprofen 800 mg as needed and take cyclobenzaprine as needed. Rest and ice area in 20 min increments. take the ibuprofen consistently for about 10 days then decrease to taking as needed.    General Counseling: Timothy Moore understanding of the findings of todays visit and agrees with plan of treatment. I have discussed any further diagnostic evaluation that may be needed or ordered today. We also reviewed his medications today. he has been encouraged to call the office with any questions or concerns that should arise related to todays visit.    Orders Placed This Encounter  Procedures   Ambulatory referral to Podiatry    Meds ordered this encounter  Medications   cyclobenzaprine (FLEXERIL) 10 MG tablet    Sig: Take 1 tablet (10 mg total) by mouth at bedtime as needed for muscle spasms.    Dispense:  30 tablet    Refill:  2   omeprazole (PRILOSEC) 20 MG capsule    Sig: Take 1 capsule (20 mg total) by mouth daily.    Dispense:  90 capsule     Refill:  1   amLODipine (NORVASC) 5 MG tablet    Sig: Take 1 tablet (5 mg total) by mouth daily.    Dispense:  90 tablet    Refill:  1   ibuprofen (ADVIL) 800 MG tablet    Sig: Take 1 tablet (800 mg total) by mouth every 8 (eight) hours as needed.    Dispense:  90 tablet    Refill:  0    Return in about 2 months (around 03/05/2023) for F/U, BP check, Timothy Moore PCP.   Total time spent:30 Minutes Time spent includes review of chart, medications, test results, and follow up plan with the patient.   [place] Controlled Substance Database was reviewed by me.  This patient was seen by Sallyanne Kuster, FNP-C in collaboration with Dr. Beverely Risen as a part of collaborative care agreement.   Timothy Caserta R. Tedd Sias, MSN, FNP-C Internal medicine

## 2023-01-24 ENCOUNTER — Encounter: Payer: Self-pay | Admitting: Podiatry

## 2023-01-24 ENCOUNTER — Ambulatory Visit (INDEPENDENT_AMBULATORY_CARE_PROVIDER_SITE_OTHER): Payer: BC Managed Care – PPO | Admitting: Podiatry

## 2023-01-24 VITALS — Ht 65.0 in | Wt 171.4 lb

## 2023-01-24 DIAGNOSIS — M722 Plantar fascial fibromatosis: Secondary | ICD-10-CM

## 2023-01-24 NOTE — Progress Notes (Signed)
Subjective:  Patient ID: Timothy Moore, male    DOB: 1965/10/13,  MRN: [id number]  Chief Complaint  Patient presents with   Plantar Fasciitis    PT is here due bilateral foot pain was told he has plantar fasciitis, went to [facility] and spent thousands on orthotics with no relief, pt is on his feet a lot due to work. Wants anything to relieve the pain.    57 y.o. male presents with the above complaint.  Patient presents with complaint of bilateral heel pain that has been on for quite some time.  He states she states he states that he went to the [facility] spent thousand dollars with no relief.  Patient is back on his feet due to a lot of work wants to do anything to relieve pain pain scale 7 out of 10 dull aching nature hurts with ambulation as with pressure.   Review of Systems: Negative except as noted in the HPI. Denies N/V/F/Ch.  Past Medical History:  Diagnosis Date   GERD (gastroesophageal reflux disease)    Hypertension     Current Outpatient Medications:    amLODipine (NORVASC) 5 MG tablet, Take 1 tablet (5 mg total) by mouth daily., Disp: 90 tablet, Rfl: 1   cetirizine (ZYRTEC ALLERGY) 10 MG tablet, Take 1 tablet (10 mg total) by mouth daily., Disp: 90 tablet, Rfl: 1   cyclobenzaprine (FLEXERIL) 10 MG tablet, Take 1 tablet (10 mg total) by mouth at bedtime as needed for muscle spasms., Disp: 30 tablet, Rfl: 2   ibuprofen (ADVIL) 800 MG tablet, Take 1 tablet (800 mg total) by mouth every 8 (eight) hours as needed., Disp: 90 tablet, Rfl: 0   loperamide (IMODIUM) 2 MG capsule, Take 1 capsule (2 mg total) by mouth 4 (four) times daily as needed for diarrhea or loose stools., Disp: 12 capsule, Rfl: 0   Multiple Vitamin (MULTIVITAMIN) tablet, Take 1 tablet by mouth daily., Disp: , Rfl:    naproxen (NAPROSYN) 500 MG tablet, Take 1 tablet (500 mg total) by mouth 2 (two) times daily as needed (pain)., Disp: 30 tablet, Rfl: 0   omeprazole (PRILOSEC) 20 MG capsule, Take 1  capsule (20 mg total) by mouth daily., Disp: 90 capsule, Rfl: 1   sildenafil (VIAGRA) 100 MG tablet, TAKE 1/2 TO 1 TABLET BY MOUTH DAILY AS NEEDED FOR ERECTILE DYSFUNCTION, Disp: 10 tablet, Rfl: 2  Social History   Tobacco Use  Smoking Status Former   Current packs/day: 0.00   Types: Cigarettes   Quit date: 03/14/2009   Years since quitting: 13.8  Smokeless Tobacco Never  Tobacco Comments   quit 11 years ago     No Known Allergies Objective:  There were no vitals filed for this visit. Body mass index is 28.52 kg/m. Constitutional Well developed. Well nourished.  Vascular Dorsalis pedis pulses palpable bilaterally. Posterior tibial pulses palpable bilaterally. Capillary refill normal to all digits.  No cyanosis or clubbing noted. Pedal hair growth normal.  Neurologic Normal speech. Oriented to person, place, and time. Epicritic sensation to light touch grossly present bilaterally.  Dermatologic Nails well groomed and normal in appearance. No open wounds. No skin lesions.  Orthopedic: Normal joint ROM without pain or crepitus bilaterally. No visible deformities. Tender to palpation at the calcaneal tuber bilaterally. No pain with calcaneal squeeze bilaterally. Ankle ROM diminished range of motion bilaterally. Silfverskiold Test: positive bilaterally.   Radiographs: None  Assessment:   1. Plantar fasciitis of right foot   2. Plantar  fasciitis of left foot    Plan:  Patient was evaluated and treated and all questions answered.  Plantar Fasciitis, bilaterally - XR reviewed as above.  - Educated on icing and stretching. Instructions given.  - Injection delivered to the plantar fascia as below. - DME: Plantar fascial brace dispensed to support the medial longitudinal arch of the foot and offload pressure from the heel and prevent arch collapse during weightbearing - Pharmacologic management: None -Night splint was also dispensed  Procedure: Injection  Tendon/Ligament Location: Bilateral plantar fascia at the glabrous junction; medial approach. Skin Prep: alcohol Injectate: 0.5 cc 0.5% marcaine plain, 0.5 cc of 1% Lidocaine, 0.5 cc kenalog 10. Disposition: Patient tolerated procedure well. Injection site dressed with a band-aid.  No follow-ups on file.

## 2023-02-06 ENCOUNTER — Other Ambulatory Visit: Payer: Self-pay

## 2023-02-06 ENCOUNTER — Emergency Department (HOSPITAL_COMMUNITY): Payer: BC Managed Care – PPO

## 2023-02-06 ENCOUNTER — Emergency Department (HOSPITAL_COMMUNITY)
Admission: EM | Admit: 2023-02-06 | Discharge: 2023-02-06 | Disposition: A | Discharge status: Home / Self Care | Payer: BC Managed Care – PPO | Attending: Emergency Medicine | Admitting: Emergency Medicine

## 2023-02-06 ENCOUNTER — Encounter (HOSPITAL_COMMUNITY): Payer: Self-pay

## 2023-02-06 DIAGNOSIS — E871 Hypo-osmolality and hyponatremia: Secondary | ICD-10-CM | POA: Insufficient documentation

## 2023-02-06 DIAGNOSIS — T5991XA Toxic effect of unspecified gases, fumes and vapors, accidental (unintentional), initial encounter: Secondary | ICD-10-CM

## 2023-02-06 DIAGNOSIS — T542X1A Toxic effect of corrosive acids and acid-like substances, accidental (unintentional), initial encounter: Secondary | ICD-10-CM | POA: Insufficient documentation

## 2023-02-06 DIAGNOSIS — Z79899 Other long term (current) drug therapy: Secondary | ICD-10-CM | POA: Insufficient documentation

## 2023-02-06 DIAGNOSIS — Z87891 Personal history of nicotine dependence: Secondary | ICD-10-CM | POA: Diagnosis not present

## 2023-02-06 DIAGNOSIS — I10 Essential (primary) hypertension: Secondary | ICD-10-CM | POA: Diagnosis not present

## 2023-02-06 DIAGNOSIS — R0602 Shortness of breath: Secondary | ICD-10-CM | POA: Diagnosis not present

## 2023-02-06 DIAGNOSIS — D72829 Elevated white blood cell count, unspecified: Secondary | ICD-10-CM | POA: Diagnosis not present

## 2023-02-06 LAB — BASIC METABOLIC PANEL
Anion gap: 10 (ref 5–15)
BUN: 17 mg/dL (ref 6–20)
CO2: 23 mmol/L (ref 22–32)
Calcium: 9.3 mg/dL (ref 8.9–10.3)
Chloride: 101 mmol/L (ref 98–111)
Creatinine, Ser: 1.34 mg/dL — ABNORMAL HIGH (ref 0.61–1.24)
GFR, Estimated: >60 mL/min (ref >60)
Glucose, Bld: 99 mg/dL (ref 70–99)
Potassium: 4.2 mmol/L (ref 3.5–5.1)
Sodium: 134 mmol/L — ABNORMAL LOW (ref 135–145)

## 2023-02-06 LAB — I-STAT VENOUS BLOOD GAS, ED
Acid-Base Excess: 1 mmol/L (ref 0.0–2.0)
Bicarbonate: 25.7 mmol/L (ref 20.0–28.0)
Calcium, Ion: 1.22 mmol/L (ref 1.15–1.40)
HCT: 41 % (ref 39.0–52.0)
Hemoglobin: 13.9 g/dL (ref 13.0–17.0)
O2 Saturation: 93 %
Potassium: 4.2 mmol/L (ref 3.5–5.1)
Sodium: 138 mmol/L (ref 135–145)
TCO2: 27 mmol/L (ref 22–32)
pCO2, Ven: 42.1 mm[Hg] — ABNORMAL LOW (ref 44–60)
pH, Ven: 7.394 (ref 7.25–7.43)
pO2, Ven: 67 mm[Hg] — ABNORMAL HIGH (ref 32–45)

## 2023-02-06 LAB — CBC
HCT: 40.7 % (ref 39.0–52.0)
Hemoglobin: 13.3 g/dL (ref 13.0–17.0)
MCH: 28.7 pg (ref 26.0–34.0)
MCHC: 32.7 g/dL (ref 30.0–36.0)
MCV: 87.7 fL (ref 80.0–100.0)
Platelets: 258 10*3/uL (ref 150–400)
RBC: 4.64 MIL/uL (ref 4.22–5.81)
RDW: 13.8 % (ref 11.5–15.5)
WBC: 11 10*3/uL — ABNORMAL HIGH (ref 4.0–10.5)
nRBC: 0 % (ref 0.0–0.2)

## 2023-02-06 MED ORDER — ALBUTEROL SULFATE HFA 108 (90 BASE) MCG/ACT IN AERS
1.0000 | INHALATION_SPRAY | Freq: Once | RESPIRATORY_TRACT | Status: AC
  Administered 2023-02-06: 1 via RESPIRATORY_TRACT
  Filled 2023-02-06: qty 6.7

## 2023-02-06 NOTE — ED Triage Notes (Signed)
Pt state he poured sulfuric acid down sink around 1500 this afternoon and accidentally inhaled it; endorses some sob , denies pain

## 2023-02-06 NOTE — ED Provider Notes (Signed)
[place] EMERGENCY DEPARTMENT AT [facility] Provider Note   CSN: [id number] Arrival date & time: 02/06/23  1638     History  No chief complaint on file.   Timothy Moore is a 57 y.o. male with past medical history significant for hypertension, GERD, history of tobacco abuse but reports he has not smoked a cigarette in 14 years who presents with concern for shortness of breath, coughing, lung irritation, burning sensation with deep inhalation after pouring sulfuric acid down the sink around 3:00 this afternoon.  Patient reports that he was pouring sulfuric acid down sinks which is part of the normal process to clean them an industrial setting, but was in a closed room, inhaled more than he intended.  Patient reports that he had some coughing fits and a lot of lung pain initially, reports that some of the pain has improved but still feels slightly short of breath.  Denies any nausea, vomiting, confusion, loss of consciousness.  HPI     Home Medications Prior to Admission medications   Medication Sig Start Date End Date Taking? Authorizing Provider  amLODipine (NORVASC) 5 MG tablet Take 1 tablet (5 mg total) by mouth daily. 01/03/23  Yes Abernathy, Arlyss Repress, NP  omeprazole (PRILOSEC) 20 MG capsule Take 1 capsule (20 mg total) by mouth daily. 01/03/23  Yes Abernathy, Alyssa, NP  sildenafil (VIAGRA) 100 MG tablet TAKE 1/2 TO 1 TABLET BY MOUTH DAILY AS NEEDED FOR ERECTILE DYSFUNCTION 12/19/22  Yes Abernathy, Alyssa, NP  cyclobenzaprine (FLEXERIL) 10 MG tablet Take 1 tablet (10 mg total) by mouth at bedtime as needed for muscle spasms. Patient not taking: Reported on 02/06/2023 01/03/23   Sallyanne Kuster, NP  ibuprofen (ADVIL) 800 MG tablet Take 1 tablet (800 mg total) by mouth every 8 (eight) hours as needed. Patient not taking: Reported on 02/06/2023 01/03/23   Sallyanne Kuster, NP      Allergies    Patient has no known allergies.    Review of Systems   Review of Systems   All other systems reviewed and are negative.   Physical Exam Updated Vital Signs BP (!) 125/90 (BP Location: Right Arm)   Pulse 84   Temp 98 F (36.7 C) (Oral)   Resp 18   SpO2 95%  Physical Exam Vitals and nursing note reviewed.  Constitutional:      General: He is not in acute distress.    Appearance: Normal appearance.  HENT:     Head: Normocephalic and atraumatic.  Eyes:     General:        Right eye: No discharge.        Left eye: No discharge.  Cardiovascular:     Rate and Rhythm: Normal rate and regular rhythm.     Heart sounds: No murmur heard.    No friction rub. No gallop.  Pulmonary:     Effort: Pulmonary effort is normal.     Breath sounds: Normal breath sounds.     Comments: Mild expiratory wheezing at lung bases Abdominal:     General: Bowel sounds are normal.     Palpations: Abdomen is soft.  Skin:    General: Skin is warm and dry.     Capillary Refill: Capillary refill takes less than 2 seconds.  Neurological:     Mental Status: He is alert and oriented to person, place, and time.  Psychiatric:        Mood and Affect: Mood normal.        Behavior:  Behavior normal.     ED Results / Procedures / Treatments   Labs (all labs ordered are listed, but only abnormal results are displayed) Labs Reviewed  CBC - Abnormal; Notable for the following components:      Result Value   WBC 11.0 (*)    All other components within normal limits  BASIC METABOLIC PANEL - Abnormal; Notable for the following components:   Sodium 134 (*)    Creatinine, Ser 1.34 (*)    All other components within normal limits  I-STAT VENOUS BLOOD GAS, ED - Abnormal; Notable for the following components:   pCO2, Ven 42.1 (*)    pO2, Ven 67 (*)    All other components within normal limits    EKG None  Radiology DG Chest 2 View  Result Date: 02/06/2023 CLINICAL DATA:  Shortness of breath, inhalation of fumes EXAM: CHEST - 2 VIEW COMPARISON:  04/08/2020 FINDINGS: Heart and  mediastinal contours are within normal limits. No focal opacities or effusions. No acute bony abnormality. IMPRESSION: No active cardiopulmonary disease. Electronically Signed   By: Charlett Nose M.D.   On: 02/06/2023 18:16    Procedures Procedures    Medications Ordered in ED Medications  albuterol (VENTOLIN HFA) 108 (90 Base) MCG/ACT inhaler 1 puff (1 puff Inhalation Given 02/06/23 1836)    ED Course/ Medical Decision Making/ A&P                                 Medical Decision Making  This patient is a 57 y.o. male  who presents to the ED for concern of shortness of breath, toxic inhalation.   Differential diagnoses prior to evaluation: The emergent differential diagnosis includes, but is not limited to,  asthma exacerbation, COPD exacerbation, acute upper respiratory infection, acute bronchitis, chronic bronchitis, interstitial lung disease, ARDS, PE, pneumonia, atypical ACS, carbon monoxide poisoning, spontaneous pneumothorax, new CHF vs CHF exacerbation, versus other, overall most suspicious for. This is not an exhaustive differential.   Past Medical History / Co-morbidities / Social History: Hypertension, acid reflux  Physical Exam: Physical exam performed. The pertinent findings include: Mild wheezing at lung bases, but no active respiratory distress, no crackles at lung bases.  Vital signs stable other than mild diastolic hypertension, blood pressure 125/90.  Lab Tests/Imaging studies: I personally interpreted labs/imaging and the pertinent results include: Mild hyponatremia, creatinine 1.34, CBC with mild leukocytosis, white blood cells 11, VBG unremarkable. I independently interpreted plain film chest xray which shows no evidence of acute intrathroacic abnormality. I agree with the radiologist interpretation.  Cardiac monitoring: EKG obtained and interpreted by myself and attending physician which shows: Normal sinus rhythm   Medications: I ordered medication including  albuterol x 1 for mild wheezing, improved on reevaluation.  I have reviewed the patients home medicines and have made adjustments as needed.   Disposition: After consideration of the diagnostic results and the patients response to treatment, I feel that patient without any acute abnormality at this time, low clinical suspicion for development of severe pneumonitis, but discussed return precautions, as the lung inflammation may worsen over the next 24 to 48 hours, if patient has worsening shortness of breath recommended return for further evaluation, probable treatment with steroids.Marland Kitchen   emergency department workup does not suggest an emergent condition requiring admission or immediate intervention beyond what has been performed at this time. The plan is: as above. The patient is safe for discharge and  has been instructed to return immediately for worsening symptoms, change in symptoms or any other concerns.  Final Clinical Impression(s) / ED Diagnoses Final diagnoses:  Toxic inhalation injury, accidental or unintentional, initial encounter    Rx / DC Orders ED Discharge Orders     None         Olene Floss, PA-C 02/06/23 1953    Rozelle Logan, DO 02/06/23 2310

## 2023-02-06 NOTE — Discharge Instructions (Signed)
Your workup today was reassuring, I do not see any signs of severe pneumonitis based on your presentation, but I recommend that you monitor yourself for any worsening shortness of breath, please return to the emergency department if you begin to develop the symptoms.

## 2023-02-06 NOTE — ED Notes (Signed)
Patient transported to X-ray 

## 2023-02-14 ENCOUNTER — Ambulatory Visit: Payer: BC Managed Care – PPO | Admitting: Nurse Practitioner

## 2023-02-17 ENCOUNTER — Ambulatory Visit: Payer: BC Managed Care – PPO | Admitting: Podiatry

## 2023-02-17 ENCOUNTER — Encounter: Payer: Self-pay | Admitting: Podiatry

## 2023-02-17 VITALS — Ht 65.0 in | Wt 171.0 lb

## 2023-02-17 DIAGNOSIS — M722 Plantar fascial fibromatosis: Secondary | ICD-10-CM | POA: Diagnosis not present

## 2023-02-17 DIAGNOSIS — Q666 Other congenital valgus deformities of feet: Secondary | ICD-10-CM | POA: Diagnosis not present

## 2023-02-17 NOTE — Progress Notes (Signed)
Subjective:  Patient ID: Timothy Moore, male    DOB: [birth year],  MRN: [id number]  Chief Complaint  Patient presents with   Foot Pain    Patient is here for bilateral foot pain PT states not feeling any better and left foot is hurting severely     57 y.o. male presents with the above complaint.  Patient presents with follow-up of bilateral plantar fasciitis.  He states he is doing a lot better denies any other acute complaints he still has some residual pain.  He does not wear any orthotics and would like to obtain that as well.   Review of Systems: Negative except as noted in the HPI. Denies N/V/F/Ch.  Past Medical History:  Diagnosis Date   GERD (gastroesophageal reflux disease)    Hypertension     Current Outpatient Medications:    amLODipine (NORVASC) 5 MG tablet, Take 1 tablet (5 mg total) by mouth daily., Disp: 90 tablet, Rfl: 1   omeprazole (PRILOSEC) 20 MG capsule, Take 1 capsule (20 mg total) by mouth daily., Disp: 90 capsule, Rfl: 1   sildenafil (VIAGRA) 100 MG tablet, TAKE 1/2 TO 1 TABLET BY MOUTH DAILY AS NEEDED FOR ERECTILE DYSFUNCTION, Disp: 10 tablet, Rfl: 2  Social History   Tobacco Use  Smoking Status Former   Current packs/day: 0.00   Types: Cigarettes   Quit date: 03/14/2009   Years since quitting: 13.9  Smokeless Tobacco Never  Tobacco Comments   quit 11 years ago     No Known Allergies Objective:  There were no vitals filed for this visit. Body mass index is 28.46 kg/m. Constitutional Well developed. Well nourished.  Vascular Dorsalis pedis pulses palpable bilaterally. Posterior tibial pulses palpable bilaterally. Capillary refill normal to all digits.  No cyanosis or clubbing noted. Pedal hair growth normal.  Neurologic Normal speech. Oriented to person, place, and time. Epicritic sensation to light touch grossly present bilaterally.  Dermatologic Nails well groomed and normal in appearance. No open wounds. No skin lesions.  Orthopedic:  Normal joint ROM without pain or crepitus bilaterally. No visible deformities. Tender to palpation at the calcaneal tuber bilaterally. No pain with calcaneal squeeze bilaterally. Ankle ROM diminished range of motion bilaterally. Silfverskiold Test: positive bilaterally.   Radiographs: None  Assessment:   1. Plantar fasciitis of right foot   2. Plantar fasciitis of left foot   3. Pes planovalgus     Plan:  Patient was evaluated and treated and all questions answered.  Plantar Fasciitis, bilaterally - XR reviewed as above.  - Educated on icing and stretching. Instructions given.  - second Injection delivered to the plantar fascia as below. - DME: Plantar fascial brace dispensed to support the medial longitudinal arch of the foot and offload pressure from the heel and prevent arch collapse during weightbearing - Pharmacologic management: None -Night splint was also dispensed  Pes planovalgus -I explained to patient the etiology of pes planovalgus and relationship with Planter fasciitis and various treatment options were discussed.  Given patient foot structure in the setting of Planter fasciitis I believe patient will benefit from custom-made orthotics to help control the hindfoot motion support the arch of the foot and take the stress away from plantar fascial.  Patient agrees with the plan like to proceed with orthotics -Patient was casted for orthotics   Procedure: Injection Tendon/Ligament Location: Bilateral plantar fascia at the glabrous junction; medial approach. Skin Prep: alcohol Injectate: 0.5 cc 0.5% marcaine plain, 0.5 cc of 1% Lidocaine, 0.5 cc kenalog 10.  Disposition: Patient tolerated procedure well. Injection site dressed with a band-aid.  No follow-ups on file.

## 2023-02-19 ENCOUNTER — Encounter: Payer: Self-pay | Admitting: Nurse Practitioner

## 2023-02-21 ENCOUNTER — Ambulatory Visit: Payer: BC Managed Care – PPO | Admitting: Podiatry

## 2023-02-21 ENCOUNTER — Encounter: Payer: Self-pay | Admitting: Nurse Practitioner

## 2023-02-21 ENCOUNTER — Ambulatory Visit: Payer: BC Managed Care – PPO | Admitting: Nurse Practitioner

## 2023-02-21 VITALS — BP 134/78 | HR 77 | Temp 98.4°F | Resp 16 | Ht 65.0 in | Wt 169.6 lb

## 2023-02-21 DIAGNOSIS — I10 Essential (primary) hypertension: Secondary | ICD-10-CM

## 2023-02-21 DIAGNOSIS — T5994XD Toxic effect of unspecified gases, fumes and vapors, undetermined, subsequent encounter: Secondary | ICD-10-CM

## 2023-02-21 DIAGNOSIS — K219 Gastro-esophageal reflux disease without esophagitis: Secondary | ICD-10-CM

## 2023-02-21 MED ORDER — AMLODIPINE BESYLATE 5 MG PO TABS
5.0000 mg | ORAL_TABLET | Freq: Every day | ORAL | 1 refills | Status: DC
Start: 2023-02-21 — End: 2023-07-14

## 2023-02-21 MED ORDER — OMEPRAZOLE 20 MG PO CPDR
20.0000 mg | DELAYED_RELEASE_CAPSULE | Freq: Every day | ORAL | 1 refills | Status: DC
Start: 2023-02-21 — End: 2023-07-14

## 2023-02-21 NOTE — Progress Notes (Signed)
[facility] [address] [place], [place] [address]  Internal MEDICINE  Office Visit Note  Patient Name: Timothy Moore  [id number]  [id number]  Date of Service: 02/21/2023  Chief Complaint  Patient presents with   Gastroesophageal Reflux   Hypertension   Follow-up    ED f/u     HPI Timothy Moore presents for a follow-up visit for hypertension, GERD, and recent ED visit  Hypertension -- controlled with amlodipine GERD -- controlled with omeprazole Lung inflammation related to inhaling fumes from sulfuric acid -- job related issue, occurred on 02/06/23. Was having SOB and difficulty breathing acutely at the time, went to ED, received a nebulizer treatment and his breathing improved. No issues since then. This is now resolved.     Current Medication: Outpatient Encounter Medications as of 02/21/2023  Medication Sig   sildenafil (VIAGRA) 100 MG tablet TAKE 1/2 TO 1 TABLET BY MOUTH DAILY AS NEEDED FOR ERECTILE DYSFUNCTION   [DISCONTINUED] amLODipine (NORVASC) 5 MG tablet Take 1 tablet (5 mg total) by mouth daily.   [DISCONTINUED] omeprazole (PRILOSEC) 20 MG capsule Take 1 capsule (20 mg total) by mouth daily.   amLODipine (NORVASC) 5 MG tablet Take 1 tablet (5 mg total) by mouth daily.   omeprazole (PRILOSEC) 20 MG capsule Take 1 capsule (20 mg total) by mouth daily.   No facility-administered encounter medications on file as of 02/21/2023.    Surgical History: Past Surgical History:  Procedure Laterality Date   COLONOSCOPY WITH PROPOFOL N/A 05/29/2020   Procedure: COLONOSCOPY WITH PROPOFOL;  Surgeon: Pasty Spillers, MD;  Location: [facility] ENDOSCOPY;  Service: Endoscopy;  Laterality: N/A;   ESOPHAGOGASTRODUODENOSCOPY (EGD) WITH PROPOFOL N/A 05/29/2020   Procedure: ESOPHAGOGASTRODUODENOSCOPY (EGD) WITH PROPOFOL;  Surgeon: Pasty Spillers, MD;  Location: [facility] ENDOSCOPY;  Service: Endoscopy;  Laterality: N/A;   ESOPHAGOGASTRODUODENOSCOPY (EGD) WITH PROPOFOL N/A  03/12/2021   Procedure: ESOPHAGOGASTRODUODENOSCOPY (EGD) WITH PROPOFOL;  Surgeon: Pasty Spillers, MD;  Location: [facility] ENDOSCOPY;  Service: Endoscopy;  Laterality: N/A;   HAND SURGERY Left     Medical History: Past Medical History:  Diagnosis Date   GERD (gastroesophageal reflux disease)    Hypertension     Family History: Family History  Problem Relation Age of Onset   Kidney Stones Mother    Cancer Father    Lupus Sister    Cancer Maternal Grandmother    Cancer Paternal Grandmother     Social History   Socioeconomic History   Marital status: Single    Spouse name: Not on file   Number of children: Not on file   Years of education: Not on file   Highest education level: Not on file  Occupational History   Not on file  Tobacco Use   Smoking status: Former    Current packs/day: 0.00    Types: Cigarettes    Quit date: 03/14/2009    Years since quitting: 13.9   Smokeless tobacco: Never   Tobacco comments:    quit 11 years ago   Vaping Use   Vaping status: Never Used  Substance and Sexual Activity   Alcohol use: Yes    Comment: 2 shots a month   Drug use: Never    Comment: marijuana 20 years ago   Sexual activity: Yes    Birth control/protection: Condom  Other Topics Concern   Not on file  Social History Narrative   Not on file   Social Determinants of Health   Financial Resource Strain: Not on file  Food  Insecurity: Not on file  Transportation Needs: Not on file  Physical Activity: Not on file  Stress: Not on file  Social Connections: Not on file  Intimate Partner Violence: Not At Risk (12/13/2021)   Humiliation, Afraid, Rape, and Kick questionnaire    Fear of Current or Ex-Partner: No    Emotionally Abused: No    Physically Abused: No    Sexually Abused: No      Review of Systems  Constitutional:  Negative for chills, fatigue and unexpected weight change.  HENT:  Negative for congestion, rhinorrhea, sneezing and sore throat.   Eyes:   Negative for redness.  Respiratory:  Negative for cough, chest tightness and shortness of breath.   Cardiovascular:  Negative for chest pain and palpitations.  Gastrointestinal:  Negative for abdominal pain, constipation, diarrhea, nausea and vomiting.  Genitourinary:  Negative for dysuria and frequency.  Musculoskeletal:  Negative for arthralgias, back pain, joint swelling and neck pain.  Skin:  Negative for rash.  Neurological: Negative.  Negative for tremors and numbness.  Hematological:  Negative for adenopathy. Does not bruise/bleed easily.  Psychiatric/Behavioral:  Negative for behavioral problems (Depression), sleep disturbance and suicidal ideas. The patient is not nervous/anxious.     Vital Signs: BP 134/78 Comment: 150/98  Pulse 77   Temp 98.4 F (36.9 C)   Resp 16   Ht 5\' 5"  (1.651 m)   Wt 169 lb 9.6 oz (76.9 kg)   SpO2 96%   BMI 28.22 kg/m    Physical Exam Vitals reviewed.  Constitutional:      General: He is not in acute distress.    Appearance: Normal appearance. He is not ill-appearing.  HENT:     Head: Normocephalic and atraumatic.  Eyes:     Pupils: Pupils are equal, round, and reactive to light.  Cardiovascular:     Rate and Rhythm: Normal rate and regular rhythm.     Heart sounds: Normal heart sounds. No murmur heard. Pulmonary:     Effort: Pulmonary effort is normal. No respiratory distress.     Breath sounds: Normal breath sounds. No wheezing.  Neurological:     Mental Status: He is alert and oriented to person, place, and time.  Psychiatric:        Mood and Affect: Mood normal.        Behavior: Behavior normal.        Assessment/Plan: 1. Essential hypertension Continue amlodipine as prescribed.  - amLODipine (NORVASC) 5 MG tablet; Take 1 tablet (5 mg total) by mouth daily.  Dispense: 90 tablet; Refill: 1  2. Gastroesophageal reflux disease without esophagitis Continue omeprazole as prescribed.  - omeprazole (PRILOSEC) 20 MG capsule; Take  1 capsule (20 mg total) by mouth daily.  Dispense: 90 capsule; Refill: 1  3. Toxic effect of unspecified gases, fumes and vapors, undetermined, subsequent encounter Resolved    General Counseling: jesselee regner understanding of the findings of todays visit and agrees with plan of treatment. I have discussed any further diagnostic evaluation that may be needed or ordered today. We also reviewed his medications today. he has been encouraged to call the office with any questions or concerns that should arise related to todays visit.    No orders of the defined types were placed in this encounter.   Meds ordered this encounter  Medications   amLODipine (NORVASC) 5 MG tablet    Sig: Take 1 tablet (5 mg total) by mouth daily.    Dispense:  90 tablet  Refill:  1   omeprazole (PRILOSEC) 20 MG capsule    Sig: Take 1 capsule (20 mg total) by mouth daily.    Dispense:  90 capsule    Refill:  1    Return in about 2 months (around 04/24/2023) for F/U, Senon Nixon PCP.   Total time spent:30 Minutes Time spent includes review of chart, medications, test results, and follow up plan with the patient.   [place] Controlled Substance Database was reviewed by me.  This patient was seen by Sallyanne Kuster, FNP-C in collaboration with Dr. Beverely Risen as a part of collaborative care agreement.   Terrance Usery R. Tedd Sias, MSN, FNP-C Internal medicine

## 2023-03-02 ENCOUNTER — Ambulatory Visit: Payer: BC Managed Care – PPO | Admitting: Nurse Practitioner

## 2023-03-14 ENCOUNTER — Ambulatory Visit (INDEPENDENT_AMBULATORY_CARE_PROVIDER_SITE_OTHER): Payer: BC Managed Care – PPO

## 2023-03-14 ENCOUNTER — Ambulatory Visit (HOSPITAL_COMMUNITY)
Admission: EM | Admit: 2023-03-14 | Discharge: 2023-03-14 | Disposition: A | Discharge status: Home / Self Care | Payer: BC Managed Care – PPO | Attending: Internal Medicine | Admitting: Internal Medicine

## 2023-03-14 ENCOUNTER — Encounter (HOSPITAL_COMMUNITY): Payer: Self-pay

## 2023-03-14 DIAGNOSIS — R062 Wheezing: Secondary | ICD-10-CM | POA: Diagnosis not present

## 2023-03-14 DIAGNOSIS — R051 Acute cough: Secondary | ICD-10-CM

## 2023-03-14 DIAGNOSIS — R059 Cough, unspecified: Secondary | ICD-10-CM | POA: Diagnosis not present

## 2023-03-14 DIAGNOSIS — R0989 Other specified symptoms and signs involving the circulatory and respiratory systems: Secondary | ICD-10-CM | POA: Diagnosis not present

## 2023-03-14 DIAGNOSIS — R9389 Abnormal findings on diagnostic imaging of other specified body structures: Secondary | ICD-10-CM | POA: Diagnosis not present

## 2023-03-14 DIAGNOSIS — J40 Bronchitis, not specified as acute or chronic: Secondary | ICD-10-CM

## 2023-03-14 MED ORDER — CLARITHROMYCIN ER 500 MG PO TB24: 1000.0000 mg | ORAL_TABLET | Freq: Every day | ORAL | 0 refills | Status: DC

## 2023-03-14 MED ORDER — ALBUTEROL SULFATE HFA 108 (90 BASE) MCG/ACT IN AERS: 2.0000 | INHALATION_SPRAY | RESPIRATORY_TRACT | 0 refills | Status: DC | PRN

## 2023-03-14 NOTE — ED Provider Notes (Signed)
 [facility]    CSN: [id number] Arrival date & time: 03/14/23  0813      History   Chief Complaint Chief Complaint  Patient presents with   Cough    HPI Nazareth Kirk is a 57 y.o. male who presents due to onset of cough and chest congestion with gray to green mucous x 4 days. Has a lot of green post nasal drainage as well. He has been coughing yellow mycous. Has been taking OTC meds without relief. Had stuffy nose since 12/15 and after a few days seemed better. Home covid-flu test was negative. Has been fatigued, but appetite is normal.     Past Medical History:  Diagnosis Date   GERD (gastroesophageal reflux disease)    Hypertension     Patient Active Problem List   Diagnosis Date Noted   HTN (hypertension) 12/13/2021   History of Helicobacter pylori infection    Gastric intestinal metaplasia    Acute gastric erosion    Encounter for screening for upper gastrointestinal disorder    Schatzki's ring    Duodenal nodule    Gastric erythema    Screen for colon cancer    Polyp of sigmoid colon     Past Surgical History:  Procedure Laterality Date   COLONOSCOPY WITH PROPOFOL  N/A 05/29/2020   Procedure: COLONOSCOPY WITH PROPOFOL ;  Surgeon: Janalyn Keene NOVAK, MD;  Location: [facility];  Service: Endoscopy;  Laterality: N/A;   ESOPHAGOGASTRODUODENOSCOPY (EGD) WITH PROPOFOL  N/A 05/29/2020   Procedure: ESOPHAGOGASTRODUODENOSCOPY (EGD) WITH PROPOFOL ;  Surgeon: Janalyn Keene NOVAK, MD;  Location: [facility];  Service: Endoscopy;  Laterality: N/A;   ESOPHAGOGASTRODUODENOSCOPY (EGD) WITH PROPOFOL  N/A 03/12/2021   Procedure: ESOPHAGOGASTRODUODENOSCOPY (EGD) WITH PROPOFOL ;  Surgeon: Janalyn Keene NOVAK, MD;  Location: [facility];  Service: Endoscopy;  Laterality: N/A;   HAND SURGERY Left        Home Medications    Prior to Admission medications   Medication Sig Start Date End Date Taking? Authorizing Provider  albuterol  (VENTOLIN  HFA) 108 (90 Base)  MCG/ACT inhaler Inhale 2 puffs into the lungs every 4 (four) hours as needed for wheezing or shortness of breath. 03/14/23  Yes Rodriguez-Southworth, Hanna Aultman, PA-C  clarithromycin  (BIAXIN  XL) 500 MG 24 hr tablet Take 2 tablets (1,000 mg total) by mouth daily. 03/14/23  Yes Rodriguez-Southworth, Telly Broberg, PA-C  amLODipine  (NORVASC ) 5 MG tablet Take 1 tablet (5 mg total) by mouth daily. 02/21/23   Liana Fish, NP  omeprazole  (PRILOSEC) 20 MG capsule Take 1 capsule (20 mg total) by mouth daily. 02/21/23   Liana Fish, NP  sildenafil  (VIAGRA ) 100 MG tablet TAKE 1/2 TO 1 TABLET BY MOUTH DAILY AS NEEDED FOR ERECTILE DYSFUNCTION 12/19/22   Liana Fish, NP    Family History Family History  Problem Relation Age of Onset   Kidney Stones Mother    Cancer Father    Lupus Sister    Cancer Maternal Grandmother    Cancer Paternal Grandmother     Social History Social History   Tobacco Use   Smoking status: Former    Current packs/day: 0.00    Types: Cigarettes    Quit date: 03/14/2009    Years since quitting: 14.0   Smokeless tobacco: Never   Tobacco comments:    quit 11 years ago   Vaping Use   Vaping status: Never Used  Substance Use Topics   Alcohol use: Yes    Comment: 2 shots a month   Drug use: Never    Comment: marijuana 20  years ago     Allergies   Patient has no known allergies.   Review of Systems Review of Systems  As noted in HPI  Physical Exam Triage Vital Signs ED Triage Vitals  Encounter Vitals Group     BP 03/14/23 0840 123/68     Systolic BP Percentile --      Diastolic BP Percentile --      Pulse Rate 03/14/23 0840 (!) 106     Resp 03/14/23 0840 16     Temp 03/14/23 0838 98.1 F (36.7 C)     Temp Source 03/14/23 0838 Oral     SpO2 03/14/23 0840 98 %     Weight --      Height --      Head Circumference --      Peak Flow --      Pain Score 03/14/23 0839 0     Pain Loc --      Pain Education --      Exclude from Growth Chart --    No  data found.  Updated Vital Signs BP 123/68 (BP Location: Right Arm)   Pulse (!) 106   Temp 98.1 F (36.7 C) (Oral)   Resp 16   SpO2 98%   Visual Acuity Right Eye Distance:   Left Eye Distance:   Bilateral Distance:    Right Eye Near:   Left Eye Near:    Bilateral Near:     Physical Exam Physical Exam Constitutional:      General: He is not in acute distress.    Appearance: He is not toxic-appearing.  HENT:     Head: Normocephalic.     Right Ear: Tympanic membrane, ear canal and external ear normal.     Left Ear: Ear canal and external ear normal.     Nose: Nose normal.     Mouth/Throat:     Mouth: Mucous membranes are moist.     Pharynx: Oropharynx is clear.  Eyes:     General: No scleral icterus.    Conjunctiva/sclera: Conjunctivae normal.  Cardiovascular:     Rate and Rhythm: Normal rate and regular rhythm.     Heart sounds: No murmur heard.   Pulmonary:     Effort: Pulmonary effort is normal. No respiratory distress.     Breath sounds: Wheezing present on both lung bases.     Comments: Has auditory wheezing Musculoskeletal:        General: Normal range of motion.     Cervical back: Neck supple.  Lymphadenopathy:     Cervical: No cervical adenopathy.  Skin:    General: Skin is warm and dry.     Findings: No rash.  Neurological:     Mental Status: He is alert and oriented to person, place, and time.     Gait: Gait normal.  Psychiatric:        Mood and Affect: Mood normal.        Behavior: Behavior normal.        Thought Content: Thought content normal.        Judgment: Judgment normal.    UC Treatments / Results  Labs (all labs ordered are listed, but only abnormal results are displayed) Labs Reviewed - No data to display  EKG   Radiology DG Chest 2 View Result Date: 03/14/2023 CLINICAL DATA:  Cough and congestion.  wheezing on bases. EXAM: CHEST - 2 VIEW COMPARISON:  02/06/2023. FINDINGS: Bilateral lung fields are clear. Note is made of  elevated right hemidiaphragm. Bilateral costophrenic angles are clear. Normal cardio-mediastinal silhouette. No acute osseous abnormalities. The soft tissues are within normal limits. IMPRESSION: No active cardiopulmonary disease. Electronically Signed   By: Ree Molt M.D.   On: 03/14/2023 09:19    Procedures Procedures (including critical care time)  Medications Ordered in UC Medications - No data to display  Initial Impression / Assessment and Plan / UC Course  I have reviewed the triage vital signs and the nursing notes. Pertinent  imaging results that were available during my care of the patient were reviewed by me and considered in my medical decision making (see chart for details). Acute bronchitis Purulent post nasal drainage  I placed him on Biaxen and Ventolin  inhaler as noted. Needs to come back if he gets worse in 2-3 days.      Final Clinical Impressions(s) / UC Diagnoses   Final diagnoses:  Acute cough  Bronchitis   Discharge Instructions   None    ED Prescriptions     Medication Sig Dispense Auth. Provider   clarithromycin  (BIAXIN  XL) 500 MG 24 hr tablet Take 2 tablets (1,000 mg total) by mouth daily. 14 tablet Rodriguez-Southworth, Kyra, PA-C   albuterol  (VENTOLIN  HFA) 108 (90 Base) MCG/ACT inhaler Inhale 2 puffs into the lungs every 4 (four) hours as needed for wheezing or shortness of breath. 6.7 each Rodriguez-Southworth, Kyra, PA-C      PDMP not reviewed this encounter.   Lindi Kyra, PA-C 03/14/23 [phone]

## 2023-03-14 NOTE — ED Triage Notes (Signed)
Pt states cough and congestion for the 4 days.  States he is coughing up yellow mucous.  States he has been taking Nyquil and Mucinex at home with no relief of his symptoms.

## 2023-03-20 ENCOUNTER — Telehealth: Payer: Self-pay

## 2023-03-20 NOTE — Telephone Encounter (Signed)
 Left vm to schedule orthotic pick up

## 2023-03-23 ENCOUNTER — Ambulatory Visit: Payer: BC Managed Care – PPO

## 2023-03-23 NOTE — Progress Notes (Signed)
 Patient presents today to pick up custom molded foot orthotics, diagnosed with PF  by Dr. Allena Katz.   Orthotics were dispensed and fit was satisfactory. Reviewed instructions for break-in and wear. Written instructions given to patient.  Patient will follow up as needed.  Timothy Moore CPed, CFo, CFm

## 2023-04-03 ENCOUNTER — Ambulatory Visit (INDEPENDENT_AMBULATORY_CARE_PROVIDER_SITE_OTHER): Payer: BC Managed Care – PPO | Admitting: Nurse Practitioner

## 2023-04-03 ENCOUNTER — Encounter: Payer: Self-pay | Admitting: Nurse Practitioner

## 2023-04-03 VITALS — BP 125/72 | HR 78 | Temp 98.4°F | Resp 16 | Ht 65.0 in | Wt 167.8 lb

## 2023-04-03 DIAGNOSIS — Z113 Encounter for screening for infections with a predominantly sexual mode of transmission: Secondary | ICD-10-CM

## 2023-04-03 DIAGNOSIS — I10 Essential (primary) hypertension: Secondary | ICD-10-CM | POA: Diagnosis not present

## 2023-04-03 DIAGNOSIS — E559 Vitamin D deficiency, unspecified: Secondary | ICD-10-CM

## 2023-04-03 DIAGNOSIS — Z0001 Encounter for general adult medical examination with abnormal findings: Secondary | ICD-10-CM

## 2023-04-03 DIAGNOSIS — E538 Deficiency of other specified B group vitamins: Secondary | ICD-10-CM

## 2023-04-03 DIAGNOSIS — E782 Mixed hyperlipidemia: Secondary | ICD-10-CM | POA: Diagnosis not present

## 2023-04-03 DIAGNOSIS — Z119 Encounter for screening for infectious and parasitic diseases, unspecified: Secondary | ICD-10-CM

## 2023-04-03 DIAGNOSIS — Z125 Encounter for screening for malignant neoplasm of prostate: Secondary | ICD-10-CM

## 2023-04-03 DIAGNOSIS — Z9189 Other specified personal risk factors, not elsewhere classified: Secondary | ICD-10-CM

## 2023-04-03 NOTE — Progress Notes (Signed)
[facility] [address] [place], [place] [address]  Internal MEDICINE  Office Visit Note  Patient Name: Timothy Moore  [id number]  [id number]  Date of Service: 04/03/2023  Chief Complaint  Patient presents with   Gastroesophageal Reflux   Hypertension   Annual Exam    HPI Labrandon presents for an annual well visit and physical exam.  Well-appearing 58 y.o. male with hypertension, high cholesterol, and GERD  Routine CRC screening: due in 2032 Labs: due for routine labs  New or worsening pain: none  Other concerns: none   The 10-year ASCVD risk score (Arnett DK, et al., 2019) is: 10.6%   Values used to calculate the score:     Age: 40 years     Sex: Male     Is Non-Hispanic African American: Yes     Diabetic: No     Tobacco smoker: No     Systolic Blood Pressure: 125 mmHg     Is BP treated: Yes     HDL Cholesterol: 48 mg/dL     Total Cholesterol: 195 mg/dL     2/95/6213    0:86 PM  Depression screen PHQ 2/9  Decreased Interest 0  Down, Depressed, Hopeless 0  PHQ - 2 Score 0   Functional Status Survey: Is the patient deaf or have difficulty hearing?: No Does the patient have difficulty seeing, even when wearing glasses/contacts?: No Does the patient have difficulty concentrating, remembering, or making decisions?: No Does the patient have difficulty walking or climbing stairs?: No Does the patient have difficulty dressing or bathing?: No Does the patient have difficulty doing errands alone such as visiting a doctor's office or shopping?: No    Current Medication: Outpatient Encounter Medications as of 04/03/2023  Medication Sig   albuterol (VENTOLIN HFA) 108 (90 Base) MCG/ACT inhaler Inhale 2 puffs into the lungs every 4 (four) hours as needed for wheezing or shortness of breath.   amLODipine (NORVASC) 5 MG tablet Take 1 tablet (5 mg total) by mouth daily.   omeprazole (PRILOSEC) 20 MG capsule Take 1 capsule (20 mg total) by mouth daily.    sildenafil (VIAGRA) 100 MG tablet TAKE 1/2 TO 1 TABLET BY MOUTH DAILY AS NEEDED FOR ERECTILE DYSFUNCTION   [DISCONTINUED] clarithromycin (BIAXIN XL) 500 MG 24 hr tablet Take 2 tablets (1,000 mg total) by mouth daily.   No facility-administered encounter medications on file as of 04/03/2023.    Surgical History: Past Surgical History:  Procedure Laterality Date   COLONOSCOPY WITH PROPOFOL N/A 05/29/2020   Procedure: COLONOSCOPY WITH PROPOFOL;  Surgeon: Pasty Spillers, MD;  Location: [facility];  Service: Endoscopy;  Laterality: N/A;   ESOPHAGOGASTRODUODENOSCOPY (EGD) WITH PROPOFOL N/A 05/29/2020   Procedure: ESOPHAGOGASTRODUODENOSCOPY (EGD) WITH PROPOFOL;  Surgeon: Pasty Spillers, MD;  Location: [facility];  Service: Endoscopy;  Laterality: N/A;   ESOPHAGOGASTRODUODENOSCOPY (EGD) WITH PROPOFOL N/A 03/12/2021   Procedure: ESOPHAGOGASTRODUODENOSCOPY (EGD) WITH PROPOFOL;  Surgeon: Pasty Spillers, MD;  Location: [facility];  Service: Endoscopy;  Laterality: N/A;   HAND SURGERY Left     Medical History: Past Medical History:  Diagnosis Date   GERD (gastroesophageal reflux disease)    Hypertension     Family History: Family History  Problem Relation Age of Onset   Kidney Stones Mother    Cancer Father    Lupus Sister    Cancer Maternal Grandmother    Cancer Paternal Grandmother     Social History   Socioeconomic History   Marital status: Single  Spouse name: Not on file   Number of children: Not on file   Years of education: Not on file   Highest education level: Not on file  Occupational History   Not on file  Tobacco Use   Smoking status: Former    Current packs/day: 0.00    Types: Cigarettes    Quit date: 03/14/2009    Years since quitting: 14.0   Smokeless tobacco: Never   Tobacco comments:    quit 11 years ago   Vaping Use   Vaping status: Never Used  Substance and Sexual Activity   Alcohol use: Yes    Comment: 2 shots a month   Drug  use: Never    Comment: marijuana 20 years ago   Sexual activity: Yes    Birth control/protection: Condom  Other Topics Concern   Not on file  Social History Narrative   Not on file   Social Drivers of Health   Financial Resource Strain: Not on file  Food Insecurity: Not on file  Transportation Needs: Not on file  Physical Activity: Not on file  Stress: Not on file  Social Connections: Not on file  Intimate Partner Violence: Not At Risk (12/13/2021)   Humiliation, Afraid, Rape, and Kick questionnaire    Fear of Current or Ex-Partner: No    Emotionally Abused: No    Physically Abused: No    Sexually Abused: No      Review of Systems  Constitutional:  Negative for activity change, appetite change, chills, fatigue, fever and unexpected weight change.  HENT: Negative.  Negative for congestion, ear pain, rhinorrhea, sore throat and trouble swallowing.   Eyes: Negative.   Respiratory: Negative.  Negative for cough, chest tightness, shortness of breath and wheezing.   Cardiovascular: Negative.  Negative for chest pain.  Gastrointestinal: Negative.  Negative for abdominal pain, blood in stool, constipation, diarrhea, nausea and vomiting.  Endocrine: Negative.   Genitourinary: Negative.  Negative for difficulty urinating, dysuria, frequency, hematuria and urgency.  Musculoskeletal: Negative.  Negative for arthralgias, back pain, joint swelling, myalgias and neck pain.  Skin: Negative.  Negative for rash and wound.  Allergic/Immunologic: Negative.  Negative for immunocompromised state.  Neurological: Negative.  Negative for dizziness, seizures, numbness and headaches.  Hematological: Negative.   Psychiatric/Behavioral: Negative.  Negative for behavioral problems, self-injury and suicidal ideas. The patient is not nervous/anxious.     Vital Signs: BP 125/72 Comment: 130/98  Pulse 78   Temp 98.4 F (36.9 C)   Resp 16   Ht 5\' 5"  (1.651 m)   Wt 167 lb 12.8 oz (76.1 kg)   SpO2 98%    BMI 27.92 kg/m    Physical Exam Vitals reviewed.  Constitutional:      General: He is awake. He is not in acute distress.    Appearance: Normal appearance. He is well-developed, well-groomed and overweight. He is not ill-appearing or diaphoretic.  HENT:     Head: Normocephalic and atraumatic.     Right Ear: Tympanic membrane, ear canal and external ear normal.     Left Ear: Tympanic membrane, ear canal and external ear normal.     Nose: Nose normal. No congestion or rhinorrhea.     Mouth/Throat:     Lips: Pink.     Mouth: Mucous membranes are moist.     Pharynx: Oropharynx is clear. Uvula midline. No oropharyngeal exudate or posterior oropharyngeal erythema.  Eyes:     General: Lids are normal. Vision grossly intact. Gaze aligned appropriately.  No scleral icterus.       Right eye: No discharge.        Left eye: No discharge.     Extraocular Movements: Extraocular movements intact.     Conjunctiva/sclera: Conjunctivae normal.     Pupils: Pupils are equal, round, and reactive to light.     Funduscopic exam:    Right eye: Red reflex present.        Left eye: Red reflex present. Neck:     Thyroid: No thyromegaly.     Vascular: No JVD.     Trachea: Trachea and phonation normal. No tracheal deviation.  Cardiovascular:     Rate and Rhythm: Normal rate and regular rhythm.     Pulses: Normal pulses.     Heart sounds: Normal heart sounds, S1 normal and S2 normal. No murmur heard.    No friction rub. No gallop.  Pulmonary:     Effort: Pulmonary effort is normal. No accessory muscle usage or respiratory distress.     Breath sounds: Normal breath sounds and air entry. No stridor. No wheezing or rales.  Chest:     Chest wall: No tenderness.  Abdominal:     General: Bowel sounds are normal. There is no distension.     Palpations: Abdomen is soft. There is no shifting dullness, fluid wave, mass or pulsatile mass.     Tenderness: There is no abdominal tenderness. There is no guarding or  rebound.  Musculoskeletal:        General: No tenderness or deformity. Normal range of motion.     Cervical back: Normal range of motion and neck supple.     Right lower leg: No edema.     Left lower leg: No edema.  Lymphadenopathy:     Cervical: No cervical adenopathy.  Skin:    General: Skin is warm and dry.     Capillary Refill: Capillary refill takes less than 2 seconds.     Coloration: Skin is not pale.     Findings: No erythema or rash.  Neurological:     Mental Status: He is alert and oriented to person, place, and time.     Cranial Nerves: No cranial nerve deficit.     Motor: No abnormal muscle tone.     Coordination: Coordination normal.     Deep Tendon Reflexes: Reflexes are normal and symmetric.  Psychiatric:        Mood and Affect: Mood normal.        Behavior: Behavior normal. Behavior is cooperative.        Thought Content: Thought content normal.        Judgment: Judgment normal.        Assessment/Plan: 1. Encounter for routine adult health examination with abnormal findings (Primary) Age-appropriate preventive screenings and vaccinations discussed, annual physical exam completed. Routine labs for health maintenance ordered, see below. PHM updated.   - CBC with Differential/Platelet - CMP14+EGFR - Lipid Profile - Lipoprotein A (LPA) - Vitamin D (25 hydroxy) - B12 and Folate Panel  2. Essential hypertension Routine labs ordered. Continue amlodipine as prescribed  - CBC with Differential/Platelet - CMP14+EGFR - Lipid Profile - Lipoprotein A (LPA)  3. Mixed hyperlipidemia Routine labs ordered  - CBC with Differential/Platelet - CMP14+EGFR - Lipid Profile - Lipoprotein A (LPA)  4. B12 deficiency Routine lab ordered  - B12 and Folate Panel  5. Vitamin D deficiency Routine lab ordered  - Vitamin D (25 hydroxy)  6. Screening for prostate cancer Routine PSA  lab ordered  - PSA Total (Reflex To Free)  7. Encounter for screening examination for  infectious disease Urine sent for STD testing and blood test for STI ordered  - STI Profile - Chlamydia/Gonococcus/Trichomonas, NAA  8. Screen for sexually transmitted diseases Urine sent for STD testing and blood test for STI ordered  - STI Profile - Chlamydia/Gonococcus/Trichomonas, NAA  9. At increased risk for cardiovascular disease Cholesterol panel and lipoprotein A lab ordered  - Lipid Profile - Lipoprotein A (LPA)      General Counseling: rajat staver understanding of the findings of todays visit and agrees with plan of treatment. I have discussed any further diagnostic evaluation that may be needed or ordered today. We also reviewed his medications today. he has been encouraged to call the office with any questions or concerns that should arise related to todays visit.    Orders Placed This Encounter  Procedures   Chlamydia/Gonococcus/Trichomonas, NAA   CBC with Differential/Platelet   CMP14+EGFR   Lipid Profile   Lipoprotein A (LPA)   PSA Total (Reflex To Free)   Vitamin D (25 hydroxy)   B12 and Folate Panel   STI Profile    No orders of the defined types were placed in this encounter.   Return for upcoming appt in february.   Total time spent:30 Minutes Time spent includes review of chart, medications, test results, and follow up plan with the patient.   [place] Controlled Substance Database was reviewed by me.  This patient was seen by Sallyanne Kuster, FNP-C in collaboration with Dr. Beverely Risen as a part of collaborative care agreement.  Tatyana Biber R. Tedd Sias, MSN, FNP-C Internal medicine

## 2023-04-04 ENCOUNTER — Encounter: Payer: Self-pay | Admitting: Nurse Practitioner

## 2023-04-06 ENCOUNTER — Telehealth: Payer: Self-pay

## 2023-04-06 LAB — CHLAMYDIA/GONOCOCCUS/TRICHOMONAS, NAA
Chlamydia by NAA: NEGATIVE
Gonococcus by NAA: NEGATIVE
Trich vag by NAA: NEGATIVE

## 2023-04-06 NOTE — Telephone Encounter (Signed)
Patient notified

## 2023-04-06 NOTE — Telephone Encounter (Signed)
-----   Message from Sallyanne Kuster sent at 04/06/2023  9:33 AM EST ----- Negative for stds

## 2023-04-06 NOTE — Progress Notes (Signed)
 Negative for std's

## 2023-04-12 ENCOUNTER — Other Ambulatory Visit (HOSPITAL_COMMUNITY)
Admission: RE | Admit: 2023-04-12 | Discharge: 2023-04-12 | Disposition: A | Discharge status: Home / Self Care | Payer: BC Managed Care – PPO | Source: Ambulatory Visit | Attending: Nurse Practitioner | Admitting: Nurse Practitioner

## 2023-04-12 ENCOUNTER — Emergency Department (HOSPITAL_COMMUNITY)
Admission: EM | Admit: 2023-04-12 | Discharge: 2023-04-12 | Discharge status: Absent without leave | Payer: BC Managed Care – PPO | Source: Home / Self Care

## 2023-04-12 DIAGNOSIS — I10 Essential (primary) hypertension: Secondary | ICD-10-CM | POA: Insufficient documentation

## 2023-04-12 DIAGNOSIS — E782 Mixed hyperlipidemia: Secondary | ICD-10-CM | POA: Insufficient documentation

## 2023-04-12 DIAGNOSIS — E538 Deficiency of other specified B group vitamins: Secondary | ICD-10-CM | POA: Insufficient documentation

## 2023-04-12 DIAGNOSIS — Z0001 Encounter for general adult medical examination with abnormal findings: Secondary | ICD-10-CM | POA: Insufficient documentation

## 2023-04-12 DIAGNOSIS — Z125 Encounter for screening for malignant neoplasm of prostate: Secondary | ICD-10-CM | POA: Insufficient documentation

## 2023-04-12 DIAGNOSIS — E559 Vitamin D deficiency, unspecified: Secondary | ICD-10-CM | POA: Diagnosis not present

## 2023-04-12 LAB — COMPREHENSIVE METABOLIC PANEL
ALT: 29 U/L (ref 0–44)
AST: 35 U/L (ref 15–41)
Albumin: 4.2 g/dL (ref 3.5–5.0)
Alkaline Phosphatase: 93 U/L (ref 38–126)
Anion gap: 12 (ref 5–15)
BUN: 12 mg/dL (ref 6–20)
CO2: 28 mmol/L (ref 22–32)
Calcium: 9.5 mg/dL (ref 8.9–10.3)
Chloride: 98 mmol/L (ref 98–111)
Creatinine, Ser: 1.3 mg/dL — ABNORMAL HIGH (ref 0.61–1.24)
GFR, Estimated: >60 mL/min (ref >60)
Glucose, Bld: 103 mg/dL — ABNORMAL HIGH (ref 70–99)
Potassium: 4.1 mmol/L (ref 3.5–5.1)
Sodium: 138 mmol/L (ref 135–145)
Total Bilirubin: 1 mg/dL (ref 0.0–1.2)
Total Protein: 7.3 g/dL (ref 6.5–8.1)

## 2023-04-12 LAB — CBC WITH DIFFERENTIAL/PLATELET
Abs Immature Granulocytes: 0.04 10*3/uL (ref 0.00–0.07)
Basophils Absolute: 0.1 10*3/uL (ref 0.0–0.1)
Basophils Relative: 1 %
Eosinophils Absolute: 0.1 10*3/uL (ref 0.0–0.5)
Eosinophils Relative: 2 %
HCT: 41.9 % (ref 39.0–52.0)
Hemoglobin: 14.3 g/dL (ref 13.0–17.0)
Immature Granulocytes: 1 %
Lymphocytes Relative: 24 %
Lymphs Abs: 1.6 10*3/uL (ref 0.7–4.0)
MCH: 29.7 pg (ref 26.0–34.0)
MCHC: 34.1 g/dL (ref 30.0–36.0)
MCV: 87.1 fL (ref 80.0–100.0)
Monocytes Absolute: 0.8 10*3/uL (ref 0.1–1.0)
Monocytes Relative: 11 %
Neutro Abs: 4.1 10*3/uL (ref 1.7–7.7)
Neutrophils Relative %: 61 %
Platelets: 298 10*3/uL (ref 150–400)
RBC: 4.81 MIL/uL (ref 4.22–5.81)
RDW: 14.2 % (ref 11.5–15.5)
WBC: 6.6 10*3/uL (ref 4.0–10.5)
nRBC: 0 % (ref 0.0–0.2)

## 2023-04-12 LAB — VITAMIN B12: Vitamin B-12: 420 pg/mL (ref 180–914)

## 2023-04-12 LAB — LIPID PANEL
Cholesterol: 208 mg/dL — ABNORMAL HIGH (ref 0–200)
HDL: 57 mg/dL (ref >40)
LDL Cholesterol: 127 mg/dL — ABNORMAL HIGH (ref 0–99)
Total CHOL/HDL Ratio: 3.6 {ratio}
Triglycerides: 119 mg/dL (ref <150)
VLDL: 24 mg/dL (ref 0–40)

## 2023-04-12 LAB — VITAMIN D 25 HYDROXY (VIT D DEFICIENCY, FRACTURES): Vit D, 25-Hydroxy: 14.92 ng/mL — ABNORMAL LOW (ref 30–100)

## 2023-04-12 LAB — FOLATE: Folate: 6.1 ng/mL (ref >5.9)

## 2023-04-13 LAB — PSA (REFLEX TO FREE) (SERIAL): Prostate Specific Ag, Serum: 1.3 ng/mL (ref 0.0–4.0)

## 2023-04-13 LAB — LIPOPROTEIN A (LPA): Lipoprotein (a): 72.2 nmol/L — ABNORMAL HIGH (ref <75.0)

## 2023-04-14 LAB — MISC LABCORP TEST (SEND OUT)

## 2023-04-14 LAB — HCV INTERPRETATION

## 2023-04-18 ENCOUNTER — Ambulatory Visit (INDEPENDENT_AMBULATORY_CARE_PROVIDER_SITE_OTHER): Payer: BC Managed Care – PPO | Admitting: Nurse Practitioner

## 2023-04-18 ENCOUNTER — Encounter: Payer: Self-pay | Admitting: Nurse Practitioner

## 2023-04-18 VITALS — BP 112/65 | HR 80 | Temp 98.5°F | Resp 16 | Ht 65.0 in | Wt 168.8 lb

## 2023-04-18 DIAGNOSIS — E559 Vitamin D deficiency, unspecified: Secondary | ICD-10-CM

## 2023-04-18 DIAGNOSIS — N528 Other male erectile dysfunction: Secondary | ICD-10-CM

## 2023-04-18 DIAGNOSIS — E782 Mixed hyperlipidemia: Secondary | ICD-10-CM

## 2023-04-18 DIAGNOSIS — I10 Essential (primary) hypertension: Secondary | ICD-10-CM | POA: Diagnosis not present

## 2023-04-18 MED ORDER — SILDENAFIL CITRATE 100 MG PO TABS: ORAL_TABLET | ORAL | 2 refills | Status: DC

## 2023-04-18 MED ORDER — VITAMIN D (ERGOCALCIFEROL) 1.25 MG (50000 UNIT) PO CAPS: 50000.0000 [IU] | ORAL_CAPSULE | ORAL | 1 refills | Status: DC

## 2023-04-18 NOTE — Progress Notes (Signed)
 [facility] [address] [place], [place] [address]  Internal MEDICINE  Office Visit Note  Patient Name: Timothy Moore  [id number]  [id number]  Date of Service: 04/18/2023  Chief Complaint  Patient presents with   Gastroesophageal Reflux   Hypertension   Follow-up    Review labs    HPI Timothy Moore presents for a follow-up visit for hypertension, high cholesterol, ED, and low vitamin D  Hypertension -- controlled with amlodipine  High cholesterol -- slightly elevated LDL and total cholesterol Erectile dysfunction -- requesting refill of sildenafil  Vitamin D  deficiency -- level is moderately low    Current Medication: Outpatient Encounter Medications as of 04/18/2023  Medication Sig   albuterol  (VENTOLIN  HFA) 108 (90 Base) MCG/ACT inhaler Inhale 2 puffs into the lungs every 4 (four) hours as needed for wheezing or shortness of breath.   amLODipine  (NORVASC ) 5 MG tablet Take 1 tablet (5 mg total) by mouth daily.   omeprazole  (PRILOSEC) 20 MG capsule Take 1 capsule (20 mg total) by mouth daily.   Vitamin D , Ergocalciferol , (DRISDOL ) 1.25 MG (50000 UNIT) CAPS capsule Take 1 capsule (50,000 Units total) by mouth every 7 (seven) days.   [DISCONTINUED] sildenafil  (VIAGRA ) 100 MG tablet TAKE 1/2 TO 1 TABLET BY MOUTH DAILY AS NEEDED FOR ERECTILE DYSFUNCTION   sildenafil  (VIAGRA ) 100 MG tablet TAKE 1/2 TO 1 TABLET BY MOUTH DAILY AS NEEDED FOR ERECTILE DYSFUNCTION   No facility-administered encounter medications on file as of 04/18/2023.    Surgical History: Past Surgical History:  Procedure Laterality Date   COLONOSCOPY WITH PROPOFOL  N/A 05/29/2020   Procedure: COLONOSCOPY WITH PROPOFOL ;  Surgeon: Janalyn Keene NOVAK, MD;  Location: [facility];  Service: Endoscopy;  Laterality: N/A;   ESOPHAGOGASTRODUODENOSCOPY (EGD) WITH PROPOFOL  N/A 05/29/2020   Procedure: ESOPHAGOGASTRODUODENOSCOPY (EGD) WITH PROPOFOL ;  Surgeon: Janalyn Keene NOVAK, MD;  Location: [facility] END[facility]COPY;  Service:  Endoscopy;  Laterality: N/A;   ESOPHAGOGASTRODUODENOSCOPY (EGD) WITH PROPOFOL  N/A 03/12/2021   Procedure: ESOPHAGOGASTRODUODENOSCOPY (EGD) WITH PROPOFOL ;  Surgeon: Janalyn Keene NOVAK, MD;  Location: [facility]COPY;  Service: Endoscopy;  Laterality: N/A;   HAND SURGERY Left     Medical History: Past Medical History:  Diagnosis Date   GERD (gastroesophageal reflux disease)    Hypertension     Family History: Family History  Problem Relation Age of Onset   Kidney Stones Mother    Cancer Father    Lupus Sister    Cancer Maternal Grandmother    Cancer Paternal Grandmother     Social History   Socioeconomic History   Marital status: Single    Spouse name: Not on file   Number of children: Not on file   Years of education: Not on file   Highest education level: Not on file  Occupational History   Not on file  Tobacco Use   Smoking status: Former    Current packs/day: 0.00    Types: Cigarettes    Quit date: 03/14/2009    Years since quitting: 14.1   Smokeless tobacco: Never   Tobacco comments:    quit 11 years ago   Vaping Use   Vaping status: Never Used  Substance and Sexual Activity   Alcohol use: Yes    Comment: 2 shots a month   Drug use: Never    Comment: marijuana 20 years ago   Sexual activity: Yes    Birth control/protection: Condom  Other Topics Concern   Not on file  Social History Narrative   Not on file   Social Drivers  of Health   Financial Resource Strain: Not on file  Food Insecurity: Not on file  Transportation Needs: Not on file  Physical Activity: Not on file  Stress: Not on file  Social Connections: Not on file  Intimate Partner Violence: Not At Risk (12/13/2021)   Humiliation, Afraid, Rape, and Kick questionnaire    Fear of Current or Ex-Partner: No    Emotionally Abused: No    Physically Abused: No    Sexually Abused: No      Review of Systems  Constitutional:  Positive for fatigue. Negative for chills and unexpected weight  change.  HENT:  Negative for congestion, rhinorrhea, sneezing and sore throat.   Eyes:  Negative for redness.  Respiratory: Negative.  Negative for cough, chest tightness, shortness of breath and wheezing.   Cardiovascular: Negative.  Negative for chest pain and palpitations.  Gastrointestinal: Negative.  Negative for abdominal pain, constipation, diarrhea, nausea and vomiting.  Genitourinary:  Negative for dysuria and frequency.  Musculoskeletal: Negative.  Negative for arthralgias, back pain, joint swelling and neck pain.  Skin:  Negative for rash.  Neurological: Negative.  Negative for tremors and numbness.  Hematological:  Negative for adenopathy. Does not bruise/bleed easily.  Psychiatric/Behavioral:  Negative for behavioral problems (Depression), sleep disturbance and suicidal ideas. The patient is not nervous/anxious.     Vital Signs: BP 112/65 Comment: 128/96  Pulse 80   Temp 98.5 F (36.9 C)   Resp 16   Ht 5' 5 (1.651 m)   Wt 168 lb 12.8 oz (76.6 kg)   SpO2 95%   BMI 28.09 kg/m    Physical Exam Vitals reviewed.  Constitutional:      General: He is not in acute distress.    Appearance: Normal appearance. He is not ill-appearing.  HENT:     Head: Normocephalic and atraumatic.  Eyes:     Pupils: Pupils are equal, round, and reactive to light.  Cardiovascular:     Rate and Rhythm: Normal rate and regular rhythm.  Pulmonary:     Effort: Pulmonary effort is normal. No respiratory distress.  Neurological:     Mental Status: He is alert and oriented to person, place, and time.  Psychiatric:        Mood and Affect: Mood normal.        Behavior: Behavior normal.        Assessment/Plan: 1. Essential hypertension (Primary) Stable, continue amlodipine  as prescribed.   2. Mixed hyperlipidemia Continue limiting red meat intake, increase lean protein intake and increase physical activity.  3. Other male erectile dysfunction Continue prn sildenafil  as prescribed.   - sildenafil  (VIAGRA ) 100 MG tablet; TAKE 1/2 TO 1 TABLET BY MOUTH DAILY AS NEEDED FOR ERECTILE DYSFUNCTION  Dispense: 10 tablet; Refill: 2  4. Vitamin D  deficiency Start weekly prescription vitamin D  supplement as prescribed. Repeat lab in 6 months  - Vitamin D , Ergocalciferol , (DRISDOL ) 1.25 MG (50000 UNIT) CAPS capsule; Take 1 capsule (50,000 Units total) by mouth every 7 (seven) days.  Dispense: 12 capsule; Refill: 1   General Counseling: Timothy Moore understanding of the findings of todays visit and agrees with plan of treatment. I have discussed any further diagnostic evaluation that may be needed or ordered today. We also reviewed his medications today. he has been encouraged to call the office with any questions or concerns that should arise related to todays visit.    No orders of the defined types were placed in this encounter.   Meds ordered this encounter  Medications   Vitamin D , Ergocalciferol , (DRISDOL ) 1.25 MG (50000 UNIT) CAPS capsule    Sig: Take 1 capsule (50,000 Units total) by mouth every 7 (seven) days.    Dispense:  12 capsule    Refill:  1    Fill new script today   sildenafil  (VIAGRA ) 100 MG tablet    Sig: TAKE 1/2 TO 1 TABLET BY MOUTH DAILY AS NEEDED FOR ERECTILE DYSFUNCTION    Dispense:  10 tablet    Refill:  2    Return in about 2 months (around 06/16/2023) for F/U, BP check, Lavelle Akel PCP.   Total time spent:30 Minutes Time spent includes review of chart, medications, test results, and follow up plan with the patient.   [place] Controlled Substance Database was reviewed by me.  This patient was seen by Mardy Maxin, FNP-C in collaboration with Dr. Sigrid Bathe as a part of collaborative care agreement.   Darrell Leonhardt R. Maxin, MSN, FNP-C Internal medicine

## 2023-04-30 DIAGNOSIS — A64 Unspecified sexually transmitted disease: Secondary | ICD-10-CM | POA: Diagnosis not present

## 2023-04-30 DIAGNOSIS — Z202 Contact with and (suspected) exposure to infections with a predominantly sexual mode of transmission: Secondary | ICD-10-CM | POA: Diagnosis not present

## 2023-04-30 DIAGNOSIS — A599 Trichomoniasis, unspecified: Secondary | ICD-10-CM | POA: Diagnosis not present

## 2023-05-05 ENCOUNTER — Other Ambulatory Visit: Payer: Self-pay | Admitting: Nurse Practitioner

## 2023-05-05 DIAGNOSIS — N528 Other male erectile dysfunction: Secondary | ICD-10-CM

## 2023-05-16 ENCOUNTER — Ambulatory Visit (INDEPENDENT_AMBULATORY_CARE_PROVIDER_SITE_OTHER): Admitting: Nurse Practitioner

## 2023-05-16 ENCOUNTER — Encounter: Payer: Self-pay | Admitting: Nurse Practitioner

## 2023-05-16 VITALS — BP 138/85 | HR 89 | Temp 98.4°F | Resp 16 | Ht 65.0 in | Wt 172.8 lb

## 2023-05-16 DIAGNOSIS — I10 Essential (primary) hypertension: Secondary | ICD-10-CM | POA: Diagnosis not present

## 2023-05-16 DIAGNOSIS — N528 Other male erectile dysfunction: Secondary | ICD-10-CM

## 2023-05-16 DIAGNOSIS — E782 Mixed hyperlipidemia: Secondary | ICD-10-CM

## 2023-05-16 NOTE — Progress Notes (Signed)
 [facility] [address] [place], [place] [address]  Internal MEDICINE  Office Visit Note  Patient Name: Timothy Moore  [id number]  [id number]  Date of Service: 05/16/2023  Chief Complaint  Patient presents with   Hypertension   Gastroesophageal Reflux   Follow-up    Review labs    HPI Resean presents for a follow-up visit for lab results and hypertension.  Negative for STDs. Hypertension -- controlled with amlodipine High cholesterol -- controlled with diet Erectile dysfunction -- takes sildenafil as needed.     Current Medication: Outpatient Encounter Medications as of 05/16/2023  Medication Sig   albuterol (VENTOLIN HFA) 108 (90 Base) MCG/ACT inhaler Inhale 2 puffs into the lungs every 4 (four) hours as needed for wheezing or shortness of breath.   amLODipine (NORVASC) 5 MG tablet Take 1 tablet (5 mg total) by mouth daily.   omeprazole (PRILOSEC) 20 MG capsule Take 1 capsule (20 mg total) by mouth daily.   Vitamin D, Ergocalciferol, (DRISDOL) 1.25 MG (50000 UNIT) CAPS capsule Take 1 capsule (50,000 Units total) by mouth every 7 (seven) days.   [DISCONTINUED] sildenafil (VIAGRA) 100 MG tablet TAKE 1/2 TO 1 TABLET BY MOUTH DAILY AS NEEDED FOR ERECTILE DYSFUNCTION   No facility-administered encounter medications on file as of 05/16/2023.    Surgical History: Past Surgical History:  Procedure Laterality Date   COLONOSCOPY WITH PROPOFOL N/A 05/29/2020   Procedure: COLONOSCOPY WITH PROPOFOL;  Surgeon: Irby Mannan, MD;  Location: [facility];  Service: Endoscopy;  Laterality: N/A;   ESOPHAGOGASTRODUODENOSCOPY (EGD) WITH PROPOFOL N/A 05/29/2020   Procedure: ESOPHAGOGASTRODUODENOSCOPY (EGD) WITH PROPOFOL;  Surgeon: Irby Mannan, MD;  Location: [facility];  Service: Endoscopy;  Laterality: N/A;   ESOPHAGOGASTRODUODENOSCOPY (EGD) WITH PROPOFOL N/A 03/12/2021   Procedure: ESOPHAGOGASTRODUODENOSCOPY (EGD) WITH PROPOFOL;  Surgeon: Irby Mannan,  MD;  Location: [facility];  Service: Endoscopy;  Laterality: N/A;   HAND SURGERY Left     Medical History: Past Medical History:  Diagnosis Date   GERD (gastroesophageal reflux disease)    Hypertension     Family History: Family History  Problem Relation Age of Onset   Kidney Stones Mother    Cancer Father    Lupus Sister    Cancer Maternal Grandmother    Cancer Paternal Grandmother     Social History   Socioeconomic History   Marital status: Single    Spouse name: Not on file   Number of children: Not on file   Years of education: Not on file   Highest education level: Not on file  Occupational History   Not on file  Tobacco Use   Smoking status: Former    Current packs/day: 0.00    Types: Cigarettes    Quit date: 03/14/2009    Years since quitting: 14.2   Smokeless tobacco: Never   Tobacco comments:    quit 11 years ago   Vaping Use   Vaping status: Never Used  Substance and Sexual Activity   Alcohol use: Yes    Comment: 2 shots a month   Drug use: Never    Comment: marijuana 20 years ago   Sexual activity: Yes    Birth control/protection: Condom  Other Topics Concern   Not on file  Social History Narrative   Not on file   Social Drivers of Health   Financial Resource Strain: Not on file  Food Insecurity: Not on file  Transportation Needs: Not on file  Physical Activity: Not on file  Stress:  Not on file  Social Connections: Not on file  Intimate Partner Violence: Not At Risk (12/13/2021)   Humiliation, Afraid, Rape, and Kick questionnaire    Fear of Current or Ex-Partner: No    Emotionally Abused: No    Physically Abused: No    Sexually Abused: No      Review of Systems  Constitutional:  Positive for fatigue. Negative for chills and unexpected weight change.  HENT:  Negative for congestion, rhinorrhea, sneezing and sore throat.   Eyes:  Negative for redness.  Respiratory: Negative.  Negative for cough, chest tightness, shortness of breath  and wheezing.   Cardiovascular: Negative.  Negative for chest pain and palpitations.  Gastrointestinal: Negative.  Negative for abdominal pain, constipation, diarrhea, nausea and vomiting.  Genitourinary:  Negative for dysuria and frequency.  Musculoskeletal: Negative.  Negative for arthralgias, back pain, joint swelling and neck pain.  Skin:  Negative for rash.  Neurological: Negative.  Negative for tremors and numbness.  Hematological:  Negative for adenopathy. Does not bruise/bleed easily.  Psychiatric/Behavioral:  Negative for behavioral problems (Depression), sleep disturbance and suicidal ideas. The patient is not nervous/anxious.     Vital Signs: BP 138/85   Pulse 89   Temp 98.4 F (36.9 C)   Resp 16   Ht 5\' 5"  (1.651 m)   Wt 172 lb 12.8 oz (78.4 kg)   SpO2 96%   BMI 28.76 kg/m    Physical Exam Vitals reviewed.  Constitutional:      General: He is not in acute distress.    Appearance: Normal appearance. He is not ill-appearing.  HENT:     Head: Normocephalic and atraumatic.  Eyes:     Pupils: Pupils are equal, round, and reactive to light.  Cardiovascular:     Rate and Rhythm: Normal rate and regular rhythm.  Pulmonary:     Effort: Pulmonary effort is normal. No respiratory distress.  Neurological:     Mental Status: He is alert and oriented to person, place, and time.  Psychiatric:        Mood and Affect: Mood normal.        Behavior: Behavior normal.        Assessment/Plan: 1. Essential hypertension (Primary) Stable, continue amlodipine as prescribed.   2. Mixed hyperlipidemia Continue to limit red meat and increase lean proteins in diet. Incorporate physical activity into routine   3. Other male erectile dysfunction Continue to use sildenafil as needed as prescribed.    General Counseling: johnston maddocks understanding of the findings of todays visit and agrees with plan of treatment. I have discussed any further diagnostic evaluation that may  be needed or ordered today. We also reviewed his medications today. he has been encouraged to call the office with any questions or concerns that should arise related to todays visit.    No orders of the defined types were placed in this encounter.   No orders of the defined types were placed in this encounter.   Return in about 2 months (around 07/16/2023) for F/U, BP check, Eudelia Hiltunen PCP.   Total time spent:30 Minutes Time spent includes review of chart, medications, test results, and follow up plan with the patient.   [place] Controlled Substance Database was reviewed by me.  This patient was seen by Laurence Pons, FNP-C in collaboration with Dr. Verneta Gone as a part of collaborative care agreement.   Emmersyn Kratzke R. Bobbi Burow, MSN, FNP-C Internal medicine

## 2023-05-20 ENCOUNTER — Encounter: Payer: Self-pay | Admitting: Nurse Practitioner

## 2023-05-20 DIAGNOSIS — E559 Vitamin D deficiency, unspecified: Secondary | ICD-10-CM | POA: Insufficient documentation

## 2023-05-20 DIAGNOSIS — N528 Other male erectile dysfunction: Secondary | ICD-10-CM | POA: Insufficient documentation

## 2023-06-06 ENCOUNTER — Encounter: Payer: Self-pay | Admitting: Nurse Practitioner

## 2023-06-23 ENCOUNTER — Other Ambulatory Visit: Payer: Self-pay | Admitting: Nurse Practitioner

## 2023-06-23 DIAGNOSIS — N528 Other male erectile dysfunction: Secondary | ICD-10-CM

## 2023-06-23 NOTE — Telephone Encounter (Signed)
 Please review and send

## 2023-06-24 ENCOUNTER — Encounter: Payer: Self-pay | Admitting: Nurse Practitioner

## 2023-07-07 ENCOUNTER — Other Ambulatory Visit: Payer: Self-pay | Admitting: Nurse Practitioner

## 2023-07-07 DIAGNOSIS — N528 Other male erectile dysfunction: Secondary | ICD-10-CM

## 2023-07-14 ENCOUNTER — Encounter: Payer: Self-pay | Admitting: Nurse Practitioner

## 2023-07-14 ENCOUNTER — Ambulatory Visit (INDEPENDENT_AMBULATORY_CARE_PROVIDER_SITE_OTHER): Admitting: Nurse Practitioner

## 2023-07-14 VITALS — BP 128/68 | HR 79 | Temp 98.4°F | Resp 16 | Ht 65.0 in | Wt 178.8 lb

## 2023-07-14 DIAGNOSIS — I10 Essential (primary) hypertension: Secondary | ICD-10-CM

## 2023-07-14 DIAGNOSIS — M722 Plantar fascial fibromatosis: Secondary | ICD-10-CM

## 2023-07-14 DIAGNOSIS — Z113 Encounter for screening for infections with a predominantly sexual mode of transmission: Secondary | ICD-10-CM

## 2023-07-14 DIAGNOSIS — E559 Vitamin D deficiency, unspecified: Secondary | ICD-10-CM | POA: Diagnosis not present

## 2023-07-14 DIAGNOSIS — K219 Gastro-esophageal reflux disease without esophagitis: Secondary | ICD-10-CM

## 2023-07-14 MED ORDER — OMEPRAZOLE 20 MG PO CPDR: 20.0000 mg | DELAYED_RELEASE_CAPSULE | Freq: Every day | ORAL | 1 refills | Status: DC

## 2023-07-14 MED ORDER — AMLODIPINE BESYLATE 5 MG PO TABS: 5.0000 mg | ORAL_TABLET | Freq: Every day | ORAL | 1 refills | Status: DC

## 2023-07-14 MED ORDER — VITAMIN D (ERGOCALCIFEROL) 1.25 MG (50000 UNIT) PO CAPS: 50000.0000 [IU] | ORAL_CAPSULE | ORAL | 1 refills | Status: DC

## 2023-07-14 NOTE — Progress Notes (Signed)
 [facility] [address] [place], [place] [address]  Internal MEDICINE  Office Visit Note  Patient Name: Timothy Moore  [id number]  [id number]  Date of Service: 07/14/2023  Chief Complaint  Patient presents with   Acute Visit    Blood pressure and feet issues.      HPI Ammaar presents for an acute sick visit for foot pain, hypertension and STD testing.  Foot pain bilaterally for plantar fasciitis, sees podiatry. Had injections done about 4 months ago and wants to have them done again. Phone number given to patient for their office.  Hypertension -- elevated initially, improved when rechecked.  Requesting STD testing.    Current Medication:  Outpatient Encounter Medications as of 07/14/2023  Medication Sig   albuterol  (VENTOLIN  HFA) 108 (90 Base) MCG/ACT inhaler Inhale 2 puffs into the lungs every 4 (four) hours as needed for wheezing or shortness of breath.   sildenafil  (VIAGRA ) 100 MG tablet TAKE 1/2 TO 1 (ONE-HALF TO ONE) TABLET BY MOUTH ONCE DAILY AS NEEDED FOR  ERECTILE  DYSFUNCTION   [DISCONTINUED] amLODipine  (NORVASC ) 5 MG tablet Take 1 tablet (5 mg total) by mouth daily.   [DISCONTINUED] omeprazole  (PRILOSEC) 20 MG capsule Take 1 capsule (20 mg total) by mouth daily.   [DISCONTINUED] Vitamin D , Ergocalciferol , (DRISDOL ) 1.25 MG (50000 UNIT) CAPS capsule Take 1 capsule (50,000 Units total) by mouth every 7 (seven) days.   amLODipine  (NORVASC ) 5 MG tablet Take 1 tablet (5 mg total) by mouth daily.   omeprazole  (PRILOSEC) 20 MG capsule Take 1 capsule (20 mg total) by mouth daily.   Vitamin D , Ergocalciferol , (DRISDOL ) 1.25 MG (50000 UNIT) CAPS capsule Take 1 capsule (50,000 Units total) by mouth every 7 (seven) days.   No facility-administered encounter medications on file as of 07/14/2023.      Medical History: Past Medical History:  Diagnosis Date   GERD (gastroesophageal reflux disease)    Hypertension      Vital Signs: BP 128/68 Comment: 150/100   Pulse 79   Temp 98.4 F (36.9 C)   Resp 16   Ht 5\' 5"  (1.651 m)   Wt 178 lb 12.8 oz (81.1 kg)   SpO2 96%   BMI 29.75 kg/m    Review of Systems  Constitutional:  Positive for fatigue. Negative for chills and unexpected weight change.  HENT:  Negative for congestion, rhinorrhea, sneezing and sore throat.   Eyes:  Negative for redness.  Respiratory: Negative.  Negative for cough, chest tightness, shortness of breath and wheezing.   Cardiovascular: Negative.  Negative for chest pain and palpitations.  Gastrointestinal: Negative.  Negative for abdominal pain, constipation, diarrhea, nausea and vomiting.  Genitourinary:  Negative for dysuria and frequency.  Musculoskeletal:  Positive for arthralgias (foot pain bilaterally). Negative for back pain, joint swelling and neck pain.  Skin:  Negative for rash.  Neurological: Negative.  Negative for tremors and numbness.  Hematological:  Negative for adenopathy. Does not bruise/bleed easily.  Psychiatric/Behavioral:  Negative for behavioral problems (Depression), sleep disturbance and suicidal ideas. The patient is not nervous/anxious.     Physical Exam Vitals reviewed.  Constitutional:      General: He is not in acute distress.    Appearance: Normal appearance. He is not ill-appearing.  HENT:     Head: Normocephalic and atraumatic.  Eyes:     Pupils: Pupils are equal, round, and reactive to light.  Cardiovascular:     Rate and Rhythm: Normal rate and regular rhythm.  Pulmonary:  Effort: Pulmonary effort is normal. No respiratory distress.  Neurological:     Mental Status: He is alert and oriented to person, place, and time.  Psychiatric:        Mood and Affect: Mood normal.        Behavior: Behavior normal.       Assessment/Plan: 1. Plantar fasciitis, bilateral (Primary) Instructed patient to call his podiatrist  2. Essential hypertension Continue amlodipine  as prescribed.  - amLODipine  (NORVASC ) 5 MG tablet; Take 1  tablet (5 mg total) by mouth daily.  Dispense: 90 tablet; Refill: 1  3. Gastroesophageal reflux disease without esophagitis Continue omeprazole  as prescribed  - omeprazole  (PRILOSEC) 20 MG capsule; Take 1 capsule (20 mg total) by mouth daily.  Dispense: 90 capsule; Refill: 1  4. Vitamin D  deficiency Continue vitamin D  once a week as prescribed.  - Vitamin D , Ergocalciferol , (DRISDOL ) 1.25 MG (50000 UNIT) CAPS capsule; Take 1 capsule (50,000 Units total) by mouth every 7 (seven) days.  Dispense: 12 capsule; Refill: 1  5. Screen for sexually transmitted diseases Urine sent for testing per patient request.  - Chlamydia/Gonococcus/Trichomonas, NAA   General Counseling: Danice Dural understanding of the findings of todays visit and agrees with plan of treatment. I have discussed any further diagnostic evaluation that may be needed or ordered today. We also reviewed his medications today. he has been encouraged to call the office with any questions or concerns that should arise related to todays visit.    Counseling:    Orders Placed This Encounter  Procedures   Chlamydia/Gonococcus/Trichomonas, NAA    Meds ordered this encounter  Medications   amLODipine  (NORVASC ) 5 MG tablet    Sig: Take 1 tablet (5 mg total) by mouth daily.    Dispense:  90 tablet    Refill:  1   omeprazole  (PRILOSEC) 20 MG capsule    Sig: Take 1 capsule (20 mg total) by mouth daily.    Dispense:  90 capsule    Refill:  1   Vitamin D , Ergocalciferol , (DRISDOL ) 1.25 MG (50000 UNIT) CAPS capsule    Sig: Take 1 capsule (50,000 Units total) by mouth every 7 (seven) days.    Dispense:  12 capsule    Refill:  1    Fill new script today    Return in about 2 months (around 09/13/2023) for F/U, Meridian Scherger PCP, BP check.  [place] Controlled Substance Database was reviewed by me for overdose risk score (ORS)  Time spent:30 Minutes Time spent with patient included reviewing progress notes, labs, imaging studies, and  discussing plan for follow up.   This patient was seen by Laurence Pons, FNP-C in collaboration with Dr. Verneta Gone as a part of collaborative care agreement.  Demani Mcbrien R. Bobbi Burow, MSN, FNP-C Internal Medicine

## 2023-07-18 LAB — CHLAMYDIA/GONOCOCCUS/TRICHOMONAS, NAA
Chlamydia by NAA: NEGATIVE
Gonococcus by NAA: NEGATIVE
Trich vag by NAA: NEGATIVE

## 2023-07-19 ENCOUNTER — Telehealth: Payer: Self-pay

## 2023-07-19 NOTE — Progress Notes (Signed)
 Negative for STDs.

## 2023-07-19 NOTE — Telephone Encounter (Signed)
 Patient notified

## 2023-07-19 NOTE — Telephone Encounter (Signed)
-----   Message from Laurence Pons sent at 07/19/2023  7:49 AM EDT ----- Negative for STDs

## 2023-07-23 ENCOUNTER — Other Ambulatory Visit: Payer: Self-pay | Admitting: Nurse Practitioner

## 2023-07-23 DIAGNOSIS — N528 Other male erectile dysfunction: Secondary | ICD-10-CM

## 2023-07-24 NOTE — Telephone Encounter (Signed)
 Please review

## 2023-08-09 ENCOUNTER — Other Ambulatory Visit: Payer: Self-pay | Admitting: Nurse Practitioner

## 2023-08-09 DIAGNOSIS — N528 Other male erectile dysfunction: Secondary | ICD-10-CM

## 2023-09-01 ENCOUNTER — Other Ambulatory Visit: Payer: Self-pay | Admitting: Nurse Practitioner

## 2023-09-01 DIAGNOSIS — N528 Other male erectile dysfunction: Secondary | ICD-10-CM

## 2023-09-02 ENCOUNTER — Other Ambulatory Visit: Payer: Self-pay

## 2023-09-02 ENCOUNTER — Ambulatory Visit (HOSPITAL_COMMUNITY)
Admission: EM | Admit: 2023-09-02 | Discharge: 2023-09-02 | Disposition: A | Discharge status: Home / Self Care | Attending: Emergency Medicine | Admitting: Emergency Medicine

## 2023-09-02 ENCOUNTER — Encounter (HOSPITAL_COMMUNITY): Payer: Self-pay | Admitting: *Deleted

## 2023-09-02 DIAGNOSIS — Z202 Contact with and (suspected) exposure to infections with a predominantly sexual mode of transmission: Secondary | ICD-10-CM | POA: Insufficient documentation

## 2023-09-02 MED ORDER — METRONIDAZOLE 500 MG PO TABS
2000.0000 mg | ORAL_TABLET | Freq: Once | ORAL | Status: AC
  Administered 2023-09-02: 2000 mg via ORAL

## 2023-09-02 MED ORDER — METRONIDAZOLE 500 MG PO TABS
ORAL_TABLET | ORAL | Status: AC
Start: 2023-09-02 — End: 2023-09-02
  Filled 2023-09-02: qty 4

## 2023-09-02 NOTE — Discharge Instructions (Addendum)
 You were treated with a one-time dose of metronidazole  today for trichomonas exposure. Your swab results will come back in a few days and we will call if you test positive for anything that requires additional treatment. Return here as needed.

## 2023-09-02 NOTE — ED Triage Notes (Addendum)
 PT had unprotected sex and wants STD check. PT has Lt testicle pain.

## 2023-09-02 NOTE — ED Provider Notes (Signed)
 [facility]    CSN: [id number] Arrival date & time: 09/02/23  1321      History   Chief Complaint Chief Complaint  Patient presents with   SEXUALLY TRANSMITTED DISEASE    HPI Timothy Moore is a 58 y.o. male.   Patient presents requesting testing and treatment due to recent exposure to trichomonas.  Patient denies penile discharge, penile/testicular pain or swelling, dysuria, hematuria, urinary frequency/urgency, abdominal pain, flank pain, and fever.  The history is provided by the patient and medical records.    Past Medical History:  Diagnosis Date   GERD (gastroesophageal reflux disease)    Hypertension     Patient Active Problem List   Diagnosis Date Noted   Vitamin D  deficiency 05/20/2023   Other male erectile dysfunction 05/20/2023   Mixed hyperlipidemia 04/03/2023   Essential hypertension 12/13/2021   History of Helicobacter pylori infection    Gastric intestinal metaplasia    Acute gastric erosion    Encounter for screening for upper gastrointestinal disorder    Schatzki's ring    Duodenal nodule    Gastric erythema    Screen for colon cancer    Polyp of sigmoid colon     Past Surgical History:  Procedure Laterality Date   COLONOSCOPY WITH PROPOFOL  N/A 05/29/2020   Procedure: COLONOSCOPY WITH PROPOFOL ;  Surgeon: Janalyn Keene NOVAK, MD;  Location: [facility];  Service: Endoscopy;  Laterality: N/A;   ESOPHAGOGASTRODUODENOSCOPY (EGD) WITH PROPOFOL  N/A 05/29/2020   Procedure: ESOPHAGOGASTRODUODENOSCOPY (EGD) WITH PROPOFOL ;  Surgeon: Janalyn Keene NOVAK, MD;  Location: [facility];  Service: Endoscopy;  Laterality: N/A;   ESOPHAGOGASTRODUODENOSCOPY (EGD) WITH PROPOFOL  N/A 03/12/2021   Procedure: ESOPHAGOGASTRODUODENOSCOPY (EGD) WITH PROPOFOL ;  Surgeon: Janalyn Keene NOVAK, MD;  Location: [facility];  Service: Endoscopy;  Laterality: N/A;   HAND SURGERY Left        Home Medications    Prior to Admission medications   Medication  Sig Start Date End Date Taking? Authorizing Provider  amLODipine  (NORVASC ) 5 MG tablet Take 1 tablet (5 mg total) by mouth daily. 07/14/23  Yes Abernathy, Mardy, NP  omeprazole  (PRILOSEC) 20 MG capsule Take 1 capsule (20 mg total) by mouth daily. 07/14/23  Yes Abernathy, Alyssa, NP  sildenafil  (VIAGRA ) 100 MG tablet TAKE 1/2 - 1 TABLET BY MOUTH DAILY AS NEEDED FOR ERECTILE DYSFUNCTION 08/01/23   Liana Mardy, NP    Family History Family History  Problem Relation Age of Onset   Kidney Stones Mother    Cancer Father    Lupus Sister    Cancer Maternal Grandmother    Cancer Paternal Grandmother     Social History Social History   Tobacco Use   Smoking status: Former    Current packs/day: 0.00    Types: Cigarettes    Quit date: 03/14/2009    Years since quitting: 14.4   Smokeless tobacco: Never   Tobacco comments:    quit 11 years ago   Vaping Use   Vaping status: Never Used  Substance Use Topics   Alcohol use: Yes    Comment: 2 shots a month   Drug use: Never    Comment: marijuana 20 years ago     Allergies   Patient has no known allergies.   Review of Systems Review of Systems  Per HPI  Physical Exam Triage Vital Signs ED Triage Vitals  Encounter Vitals Group     BP 09/02/23 1335 122/80     Girls Systolic BP Percentile --  Girls Diastolic BP Percentile --      Boys Systolic BP Percentile --      Boys Diastolic BP Percentile --      Pulse Rate 09/02/23 1335 67     Resp 09/02/23 1335 18     Temp 09/02/23 1335 98.3 F (36.8 C)     Temp src --      SpO2 09/02/23 1335 92 %     Weight --      Height --      Head Circumference --      Peak Flow --      Pain Score 09/02/23 1332 2     Pain Loc --      Pain Education --      Exclude from Growth Chart --    No data found.  Updated Vital Signs BP 122/80   Pulse 67   Temp 98.3 F (36.8 C)   Resp 18   SpO2 92%   Visual Acuity Right Eye Distance:   Left Eye Distance:   Bilateral Distance:     Right Eye Near:   Left Eye Near:    Bilateral Near:     Physical Exam Vitals and nursing note reviewed.  Constitutional:      General: He is awake. He is not in acute distress.    Appearance: Normal appearance. He is well-developed and well-groomed. He is not ill-appearing.  Genitourinary:    Comments: Exam deferred  Neurological:     Mental Status: He is alert.   Psychiatric:        Behavior: Behavior is cooperative.      UC Treatments / Results  Labs (all labs ordered are listed, but only abnormal results are displayed) Labs Reviewed  CYTOLOGY, (ORAL, ANAL, URETHRAL) ANCILLARY ONLY    EKG   Radiology No results found.  Procedures Procedures (including critical care time)  Medications Ordered in UC Medications  metroNIDAZOLE  (FLAGYL ) tablet 2,000 mg (has no administration in time range)    Initial Impression / Assessment and Plan / UC Course  I have reviewed the triage vital signs and the nursing notes.  Pertinent labs & imaging results that were available during my care of the patient were reviewed by me and considered in my medical decision making (see chart for details).     Patient is well-appearing.  Vitals are stable.  GU exam deferred.  Patient performed self swab for STD.  HIV and RPR declined.  Empirically treating with one-time dose of Flagyl  in clinic for trichomonas exposure.  Discussed follow-up and return precautions. Final Clinical Impressions(s) / UC Diagnoses   Final diagnoses:  Exposure to trichomonas     Discharge Instructions      You were treated with a one-time dose of metronidazole  today for trichomonas exposure. Your swab results will come back in a few days and we will call if you test positive for anything that requires additional treatment. Return here as needed.    ED Prescriptions   None    PDMP not reviewed this encounter.   Johnie Flaming A, NP 09/02/23 1420

## 2023-09-04 ENCOUNTER — Ambulatory Visit: Admitting: Nurse Practitioner

## 2023-09-04 ENCOUNTER — Ambulatory Visit (HOSPITAL_COMMUNITY): Payer: Self-pay

## 2023-09-04 LAB — CYTOLOGY, (ORAL, ANAL, URETHRAL) ANCILLARY ONLY
Chlamydia: NEGATIVE
Comment: NEGATIVE
Comment: NEGATIVE
Comment: NORMAL
Neisseria Gonorrhea: NEGATIVE
Trichomonas: POSITIVE — AB

## 2023-09-13 ENCOUNTER — Encounter: Payer: Self-pay | Admitting: Nurse Practitioner

## 2023-09-13 ENCOUNTER — Ambulatory Visit (INDEPENDENT_AMBULATORY_CARE_PROVIDER_SITE_OTHER): Admitting: Nurse Practitioner

## 2023-09-13 VITALS — BP 124/89 | HR 75 | Temp 97.8°F | Resp 16 | Ht 65.0 in | Wt 178.0 lb

## 2023-09-13 DIAGNOSIS — I10 Essential (primary) hypertension: Secondary | ICD-10-CM

## 2023-09-13 DIAGNOSIS — K31A0 Gastric intestinal metaplasia, unspecified: Secondary | ICD-10-CM

## 2023-09-13 DIAGNOSIS — Z202 Contact with and (suspected) exposure to infections with a predominantly sexual mode of transmission: Secondary | ICD-10-CM | POA: Diagnosis not present

## 2023-09-13 DIAGNOSIS — M722 Plantar fascial fibromatosis: Secondary | ICD-10-CM

## 2023-09-13 DIAGNOSIS — K219 Gastro-esophageal reflux disease without esophagitis: Secondary | ICD-10-CM | POA: Diagnosis not present

## 2023-09-13 DIAGNOSIS — Z1212 Encounter for screening for malignant neoplasm of rectum: Secondary | ICD-10-CM

## 2023-09-13 DIAGNOSIS — N528 Other male erectile dysfunction: Secondary | ICD-10-CM | POA: Diagnosis not present

## 2023-09-13 DIAGNOSIS — Z1211 Encounter for screening for malignant neoplasm of colon: Secondary | ICD-10-CM

## 2023-09-13 MED ORDER — METRONIDAZOLE 500 MG PO TABS
2000.0000 mg | ORAL_TABLET | Freq: Once | ORAL | 0 refills | Status: AC
Start: 2023-09-13 — End: 2023-09-13

## 2023-09-13 MED ORDER — TADALAFIL 20 MG PO TABS
20.0000 mg | ORAL_TABLET | Freq: Every day | ORAL | 5 refills | Status: DC | PRN
Start: 2023-09-13 — End: 2023-11-14

## 2023-09-13 MED ORDER — OMEPRAZOLE 40 MG PO CPDR: 40.0000 mg | DELAYED_RELEASE_CAPSULE | Freq: Every day | ORAL | 1 refills | Status: DC

## 2023-09-13 NOTE — Progress Notes (Signed)
 [facility] [address] [place], [place] [address]  Internal MEDICINE  Office Visit Note  Patient Name: Timothy Moore  [id number]  [id number]  Date of Service: 09/13/2023  Chief Complaint  Patient presents with   Follow-up   Gastroesophageal Reflux   Hypertension   Medication Refill    HPI Bleu presents for a follow-up visit for hypertension, GERD, ED, and STD testing Hypertension -- well controlled with amlodipine  GERD -- not well controlled with omeprazole  20 mg.  ED -- takes viagra  as needed, wants to switch to cialis  Recent exposure and treatment for trichomonas infection. Wants to retest and take an additional treatment dose.     Current Medication: Outpatient Encounter Medications as of 09/13/2023  Medication Sig   amLODipine  (NORVASC ) 5 MG tablet Take 1 tablet (5 mg total) by mouth daily.   metroNIDAZOLE  (FLAGYL ) 500 MG tablet Take 4 tablets (2,000 mg total) by mouth once for 1 dose.   omeprazole  (PRILOSEC) 40 MG capsule Take 1 capsule (40 mg total) by mouth daily.   tadalafil  (CIALIS ) 20 MG tablet Take 1 tablet (20 mg total) by mouth daily as needed for erectile dysfunction.   [DISCONTINUED] omeprazole  (PRILOSEC) 20 MG capsule Take 1 capsule (20 mg total) by mouth daily.   [DISCONTINUED] sildenafil  (VIAGRA ) 100 MG tablet TAKE 1/2 TO 1 TABLET BY MOUTH ONCE DAILY AS NEEDED FOR ERECTILE DYSFUNCTION   No facility-administered encounter medications on file as of 09/13/2023.    Surgical History: Past Surgical History:  Procedure Laterality Date   COLONOSCOPY WITH PROPOFOL  N/A 05/29/2020   Procedure: COLONOSCOPY WITH PROPOFOL ;  Surgeon: Janalyn Keene NOVAK, MD;  Location: [facility];  Service: Endoscopy;  Laterality: N/A;   ESOPHAGOGASTRODUODENOSCOPY (EGD) WITH PROPOFOL  N/A 05/29/2020   Procedure: ESOPHAGOGASTRODUODENOSCOPY (EGD) WITH PROPOFOL ;  Surgeon: Janalyn Keene NOVAK, MD;  Location: [facility];  Service: Endoscopy;  Laterality: N/A;    ESOPHAGOGASTRODUODENOSCOPY (EGD) WITH PROPOFOL  N/A 03/12/2021   Procedure: ESOPHAGOGASTRODUODENOSCOPY (EGD) WITH PROPOFOL ;  Surgeon: Janalyn Keene NOVAK, MD;  Location: [facility];  Service: Endoscopy;  Laterality: N/A;   HAND SURGERY Left     Medical History: Past Medical History:  Diagnosis Date   GERD (gastroesophageal reflux disease)    Hypertension     Family History: Family History  Problem Relation Age of Onset   Kidney Stones Mother    Cancer Father    Lupus Sister    Cancer Maternal Grandmother    Cancer Paternal Grandmother     Social History   Socioeconomic History   Marital status: Single    Spouse name: Not on file   Number of children: Not on file   Years of education: Not on file   Highest education level: Not on file  Occupational History   Not on file  Tobacco Use   Smoking status: Former    Current packs/day: 0.00    Types: Cigarettes    Quit date: 03/14/2009    Years since quitting: 14.5   Smokeless tobacco: Never   Tobacco comments:    quit 11 years ago   Vaping Use   Vaping status: Never Used  Substance and Sexual Activity   Alcohol use: Yes    Comment: 2 shots a month   Drug use: Never    Comment: marijuana 20 years ago   Sexual activity: Yes    Birth control/protection: Condom  Other Topics Concern   Not on file  Social History Narrative   Not on file   Social Drivers of Health  Financial Resource Strain: Not on file  Food Insecurity: Not on file  Transportation Needs: Not on file  Physical Activity: Not on file  Stress: Not on file  Social Connections: Not on file  Intimate Partner Violence: Not At Risk (12/13/2021)   Humiliation, Afraid, Rape, and Kick questionnaire    Fear of Current or Ex-Partner: No    Emotionally Abused: No    Physically Abused: No    Sexually Abused: No      Review of Systems  Constitutional:  Positive for fatigue. Negative for chills and unexpected weight change.  HENT:  Negative for  congestion, rhinorrhea, sneezing and sore throat.   Eyes:  Negative for redness.  Respiratory: Negative.  Negative for cough, chest tightness, shortness of breath and wheezing.   Cardiovascular: Negative.  Negative for chest pain and palpitations.  Gastrointestinal: Negative.  Negative for abdominal pain, constipation, diarrhea, nausea and vomiting.  Genitourinary:  Negative for dysuria and frequency.  Musculoskeletal: Negative.  Negative for arthralgias, back pain, joint swelling and neck pain.  Skin:  Negative for rash.  Neurological: Negative.  Negative for tremors and numbness.  Hematological:  Negative for adenopathy. Does not bruise/bleed easily.  Psychiatric/Behavioral:  Negative for behavioral problems (Depression), sleep disturbance and suicidal ideas. The patient is not nervous/anxious.     Vital Signs: BP 124/89   Pulse 75   Temp 97.8 F (36.6 C)   Resp 16   Ht 5' 5 (1.651 m)   Wt 178 lb (80.7 kg)   SpO2 98%   BMI 29.62 kg/m    Physical Exam Vitals reviewed.  Constitutional:      General: He is not in acute distress.    Appearance: Normal appearance. He is not ill-appearing.  HENT:     Head: Normocephalic and atraumatic.  Eyes:     Pupils: Pupils are equal, round, and reactive to light.  Cardiovascular:     Rate and Rhythm: Normal rate and regular rhythm.  Pulmonary:     Effort: Pulmonary effort is normal. No respiratory distress.  Neurological:     Mental Status: He is alert and oriented to person, place, and time.  Psychiatric:        Mood and Affect: Mood normal.        Behavior: Behavior normal.        Assessment/Plan: 1. Essential hypertension (Primary) Stable, continue amlodipine  as prescribed.  2. Gastroesophageal reflux disease without esophagitis Omeprazole  dose increase to 40 mg daily.  - omeprazole  (PRILOSEC) 40 MG capsule; Take 1 capsule (40 mg total) by mouth daily.  Dispense: 90 capsule; Refill: 1  3. Other male erectile  dysfunction Discontinue viagra  and try cialis  as needed.  - tadalafil  (CIALIS ) 20 MG tablet; Take 1 tablet (20 mg total) by mouth daily as needed for erectile dysfunction.  Dispense: 30 tablet; Refill: 5  4. Exposure to trichomonas Urine sent for STD testing, single dose of metronidazole  prescribed.  - Chlamydia/Gonococcus/Trichomonas, NAA - metroNIDAZOLE  (FLAGYL ) 500 MG tablet; Take 4 tablets (2,000 mg total) by mouth once for 1 dose.  Dispense: 4 tablet; Refill: 0  5. Gastric intestinal metaplasia Referred to GI, due for upper endoscopy this year  - Ambulatory referral to Gastroenterology  6. Screening for colorectal cancer Referred to GI - Ambulatory referral to Gastroenterology   General Counseling: samul mcinroy understanding of the findings of todays visit and agrees with plan of treatment. I have discussed any further diagnostic evaluation that may be needed or ordered today. We also reviewed  his medications today. he has been encouraged to call the office with any questions or concerns that should arise related to todays visit.    Orders Placed This Encounter  Procedures   Chlamydia/Gonococcus/Trichomonas, NAA   Ambulatory referral to Gastroenterology    Meds ordered this encounter  Medications   omeprazole  (PRILOSEC) 40 MG capsule    Sig: Take 1 capsule (40 mg total) by mouth daily.    Dispense:  90 capsule    Refill:  1    Fill new script today, note increased dose, discontinue 20 mg capsule   tadalafil  (CIALIS ) 20 MG tablet    Sig: Take 1 tablet (20 mg total) by mouth daily as needed for erectile dysfunction.    Dispense:  30 tablet    Refill:  5    Fill new script today, discontinue sildenafil    metroNIDAZOLE  (FLAGYL ) 500 MG tablet    Sig: Take 4 tablets (2,000 mg total) by mouth once for 1 dose.    Dispense:  4 tablet    Refill:  0    Fill new script today asap    Return in about 2 months (around 11/14/2023) for F/U, BP check, Harmoney Sienkiewicz PCP.   Total time  spent:30 Minutes Time spent includes review of chart, medications, test results, and follow up plan with the patient.   [place] Controlled Substance Database was reviewed by me.  This patient was seen by Mardy Maxin, FNP-C in collaboration with Dr. Sigrid Bathe as a part of collaborative care agreement.   Keshayla Schrum R. Maxin, MSN, FNP-C Internal medicine

## 2023-09-14 ENCOUNTER — Encounter: Payer: Self-pay | Admitting: Nurse Practitioner

## 2023-09-14 ENCOUNTER — Telehealth: Payer: Self-pay | Admitting: Nurse Practitioner

## 2023-09-14 NOTE — Telephone Encounter (Signed)
 Awaiting 09/13/23 office notes for GI referral-[facility]

## 2023-09-15 ENCOUNTER — Other Ambulatory Visit: Payer: Self-pay | Admitting: Nurse Practitioner

## 2023-09-15 DIAGNOSIS — Z202 Contact with and (suspected) exposure to infections with a predominantly sexual mode of transmission: Secondary | ICD-10-CM

## 2023-09-17 LAB — CHLAMYDIA/GONOCOCCUS/TRICHOMONAS, NAA
Chlamydia by NAA: NEGATIVE
Gonococcus by NAA: NEGATIVE
Trich vag by NAA: NEGATIVE

## 2023-09-18 ENCOUNTER — Other Ambulatory Visit: Payer: Self-pay | Admitting: Nurse Practitioner

## 2023-09-18 ENCOUNTER — Encounter: Payer: Self-pay | Admitting: Nurse Practitioner

## 2023-09-18 MED ORDER — IBUPROFEN 800 MG PO TABS: 800.0000 mg | ORAL_TABLET | Freq: Three times a day (TID) | ORAL | 0 refills | Status: DC | PRN

## 2023-09-18 NOTE — Progress Notes (Signed)
 Patient notified

## 2023-09-21 ENCOUNTER — Telehealth: Payer: Self-pay | Admitting: Nurse Practitioner

## 2023-09-21 NOTE — Telephone Encounter (Signed)
 GI referral sent via [facility] to [facility] per patient request. Notified patient. Gave telephone# [phone]-[facility]

## 2023-10-16 ENCOUNTER — Telehealth: Payer: Self-pay | Admitting: Nurse Practitioner

## 2023-10-16 NOTE — Telephone Encounter (Signed)
 Gastroenterology appointment 03/21/2024 @ [facility]

## 2023-10-25 ENCOUNTER — Encounter: Payer: Self-pay | Admitting: Nurse Practitioner

## 2023-10-25 ENCOUNTER — Ambulatory Visit (INDEPENDENT_AMBULATORY_CARE_PROVIDER_SITE_OTHER): Admitting: Nurse Practitioner

## 2023-10-25 VITALS — BP 134/88 | HR 83 | Temp 98.3°F | Resp 16 | Ht 65.0 in | Wt 176.2 lb

## 2023-10-25 DIAGNOSIS — B360 Pityriasis versicolor: Secondary | ICD-10-CM | POA: Diagnosis not present

## 2023-10-25 DIAGNOSIS — L819 Disorder of pigmentation, unspecified: Secondary | ICD-10-CM

## 2023-10-25 NOTE — Progress Notes (Signed)
 [facility] [address] [place], [place] [address]  Internal MEDICINE  Office Visit Note  Patient Name: Timothy Moore  [id number]  [id number]  Date of Service: 10/25/2023  Chief Complaint  Patient presents with   Acute Visit    Possible vitiligo on face.     HPI Amario presents for an acute sick visit for __ Hypopigmentatiion of skin of face esp on cheeks, subtle, no super pronounced. Discoloration resembles tinea versicolor rash No itching or pain   Noticed about a week or 2 ago.    Current Medication:  Outpatient Encounter Medications as of 10/25/2023  Medication Sig   amLODipine  (NORVASC ) 5 MG tablet Take 1 tablet (5 mg total) by mouth daily.   ibuprofen  (ADVIL ) 800 MG tablet Take 1 tablet (800 mg total) by mouth every 8 (eight) hours as needed.   omeprazole  (PRILOSEC) 40 MG capsule Take 1 capsule (40 mg total) by mouth daily.   tadalafil  (CIALIS ) 20 MG tablet Take 1 tablet (20 mg total) by mouth daily as needed for erectile dysfunction.   No facility-administered encounter medications on file as of 10/25/2023.      Medical History: Past Medical History:  Diagnosis Date   GERD (gastroesophageal reflux disease)    Hypertension      Vital Signs: BP 134/88   Pulse 83   Temp 98.3 F (36.8 C)   Resp 16   Ht 5' 5 (1.651 m)   Wt 176 lb 3.2 oz (79.9 kg)   SpO2 94%   BMI 29.32 kg/m    Review of Systems  Constitutional:  Negative for chills, fatigue and unexpected weight change.  HENT:  Negative for congestion, postnasal drip, rhinorrhea, sneezing and sore throat.   Eyes:  Negative for redness.  Respiratory: Negative.  Negative for cough, chest tightness, shortness of breath and wheezing.   Cardiovascular: Negative.  Negative for chest pain and palpitations.  Gastrointestinal:  Negative for abdominal pain, constipation, diarrhea, nausea and vomiting.  Genitourinary:  Negative for dysuria and frequency.  Musculoskeletal:  Negative for  arthralgias, back pain, joint swelling and neck pain.  Skin:  Positive for rash (rash on face that looks like a hypopigmentation).  Neurological: Negative.  Negative for tremors and numbness.  Hematological:  Negative for adenopathy. Does not bruise/bleed easily.  Psychiatric/Behavioral:  Negative for behavioral problems (Depression), sleep disturbance and suicidal ideas. The patient is not nervous/anxious.     Physical Exam Vitals reviewed.  Constitutional:      General: He is not in acute distress.    Appearance: Normal appearance. He is not ill-appearing.  HENT:     Head: Normocephalic and atraumatic.  Eyes:     Pupils: Pupils are equal, round, and reactive to light.  Cardiovascular:     Rate and Rhythm: Normal rate and regular rhythm.  Pulmonary:     Effort: Pulmonary effort is normal. No respiratory distress.  Skin:    Comments: Hypopigmented rash on face esp cheeks.   Neurological:     Mental Status: He is alert and oriented to person, place, and time.  Psychiatric:        Mood and Affect: Mood normal.        Behavior: Behavior normal.       Assessment/Plan: 1. Tinea versicolor (Primary) Apply selsun blue shampoo to the rash on the face and leave on for 30 minutes daily then wash off. Repeat this for 7 days. This has an 80-90% cure rate.    General Counseling: Garnette  verbalizes understanding of the findings of todays visit and agrees with plan of treatment. I have discussed any further diagnostic evaluation that may be needed or ordered today. We also reviewed his medications today. he has been encouraged to call the office with any questions or concerns that should arise related to todays visit.    Counseling:    No orders of the defined types were placed in this encounter.   No orders of the defined types were placed in this encounter.   Return if symptoms worsen or fail to improve.  [place] Controlled Substance Database was reviewed by me for overdose risk  score (ORS)  Time spent:20 Minutes Time spent with patient included reviewing progress notes, labs, imaging studies, and discussing plan for follow up.   This patient was seen by Mardy Maxin, FNP-C in collaboration with Dr. Sigrid Bathe as a part of collaborative care agreement.  Treasa Bradshaw R. Maxin, MSN, FNP-C Internal Medicine

## 2023-10-26 ENCOUNTER — Encounter: Payer: Self-pay | Admitting: Nurse Practitioner

## 2023-11-01 ENCOUNTER — Other Ambulatory Visit: Payer: Self-pay | Admitting: Nurse Practitioner

## 2023-11-01 DIAGNOSIS — N528 Other male erectile dysfunction: Secondary | ICD-10-CM

## 2023-11-14 ENCOUNTER — Ambulatory Visit: Admitting: Nurse Practitioner

## 2023-11-14 ENCOUNTER — Encounter: Payer: Self-pay | Admitting: Nurse Practitioner

## 2023-11-14 VITALS — BP 136/82 | HR 80 | Temp 98.3°F | Resp 16 | Ht 65.0 in | Wt 176.2 lb

## 2023-11-14 DIAGNOSIS — N528 Other male erectile dysfunction: Secondary | ICD-10-CM

## 2023-11-14 DIAGNOSIS — I10 Essential (primary) hypertension: Secondary | ICD-10-CM

## 2023-11-14 DIAGNOSIS — Z79899 Other long term (current) drug therapy: Secondary | ICD-10-CM | POA: Diagnosis not present

## 2023-11-14 DIAGNOSIS — K219 Gastro-esophageal reflux disease without esophagitis: Secondary | ICD-10-CM | POA: Diagnosis not present

## 2023-11-14 MED ORDER — AMLODIPINE BESYLATE 5 MG PO TABS: 5.0000 mg | ORAL_TABLET | Freq: Every day | ORAL | 1 refills | Status: DC

## 2023-11-14 MED ORDER — TADALAFIL 20 MG PO TABS: 20.0000 mg | ORAL_TABLET | Freq: Every day | ORAL | 5 refills | Status: DC | PRN

## 2023-11-14 MED ORDER — OMEPRAZOLE 40 MG PO CPDR: 40.0000 mg | DELAYED_RELEASE_CAPSULE | Freq: Every day | ORAL | 1 refills | Status: DC

## 2023-11-14 MED ORDER — IBUPROFEN 800 MG PO TABS: 800.0000 mg | ORAL_TABLET | Freq: Three times a day (TID) | ORAL | 0 refills | Status: DC | PRN

## 2023-11-14 NOTE — Progress Notes (Signed)
 [facility] [address] [place], [place] [address]  Internal MEDICINE  Office Visit Note  Patient Name: Timothy Moore  [id number]  [id number]  Date of Service: 11/14/2023  Chief Complaint  Patient presents with   Gastroesophageal Reflux   Hypertension   Follow-up    HPI Timothy Moore presents for a follow-up visit for hypertension, GERD, back pain and ED.   Hypertension -- controlled with amlodipine  GERD -- symptoms controlled with omeprazole . Low back pain and foot pain intermittently. -- takes ibuprofen  as needed ED -- takes tadalafil  as needed.     Current Medication: Outpatient Encounter Medications as of 11/14/2023  Medication Sig   amLODipine  (NORVASC ) 5 MG tablet Take 1 tablet (5 mg total) by mouth daily.   ibuprofen  (ADVIL ) 800 MG tablet Take 1 tablet (800 mg total) by mouth every 8 (eight) hours as needed.   omeprazole  (PRILOSEC) 40 MG capsule Take 1 capsule (40 mg total) by mouth daily.   tadalafil  (CIALIS ) 20 MG tablet Take 1 tablet (20 mg total) by mouth daily as needed for erectile dysfunction.   [DISCONTINUED] amLODipine  (NORVASC ) 5 MG tablet Take 1 tablet (5 mg total) by mouth daily.   [DISCONTINUED] ibuprofen  (ADVIL ) 800 MG tablet Take 1 tablet (800 mg total) by mouth every 8 (eight) hours as needed.   [DISCONTINUED] omeprazole  (PRILOSEC) 40 MG capsule Take 1 capsule (40 mg total) by mouth daily.   [DISCONTINUED] tadalafil  (CIALIS ) 20 MG tablet Take 1 tablet (20 mg total) by mouth daily as needed for erectile dysfunction.   No facility-administered encounter medications on file as of 11/14/2023.    Surgical History: Past Surgical History:  Procedure Laterality Date   COLONOSCOPY WITH PROPOFOL  N/A 05/29/2020   Procedure: COLONOSCOPY WITH PROPOFOL ;  Surgeon: Janalyn Keene NOVAK, MD;  Location: [facility];  Service: Endoscopy;  Laterality: N/A;   ESOPHAGOGASTRODUODENOSCOPY (EGD) WITH PROPOFOL  N/A 05/29/2020   Procedure: ESOPHAGOGASTRODUODENOSCOPY (EGD)  WITH PROPOFOL ;  Surgeon: Janalyn Keene NOVAK, MD;  Location: [facility];  Service: Endoscopy;  Laterality: N/A;   ESOPHAGOGASTRODUODENOSCOPY (EGD) WITH PROPOFOL  N/A 03/12/2021   Procedure: ESOPHAGOGASTRODUODENOSCOPY (EGD) WITH PROPOFOL ;  Surgeon: Janalyn Keene NOVAK, MD;  Location: [facility];  Service: Endoscopy;  Laterality: N/A;   HAND SURGERY Left     Medical History: Past Medical History:  Diagnosis Date   GERD (gastroesophageal reflux disease)    Hypertension     Family History: Family History  Problem Relation Age of Onset   Kidney Stones Mother    Cancer Father    Lupus Sister    Cancer Maternal Grandmother    Cancer Paternal Grandmother     Social History   Socioeconomic History   Marital status: Single    Spouse name: Not on file   Number of children: Not on file   Years of education: Not on file   Highest education level: Not on file  Occupational History   Not on file  Tobacco Use   Smoking status: Former    Current packs/day: 0.00    Types: Cigarettes    Quit date: 03/14/2009    Years since quitting: 14.6   Smokeless tobacco: Never   Tobacco comments:    quit 11 years ago   Vaping Use   Vaping status: Never Used  Substance and Sexual Activity   Alcohol use: Yes    Comment: 2 shots a month   Drug use: Never    Comment: marijuana 20 years ago   Sexual activity: Yes    Birth control/protection:  Condom  Other Topics Concern   Not on file  Social History Narrative   Not on file   Social Drivers of Health   Financial Resource Strain: Not on file  Food Insecurity: Not on file  Transportation Needs: Not on file  Physical Activity: Not on file  Stress: Not on file  Social Connections: Not on file  Intimate Partner Violence: Not At Risk (12/13/2021)   Humiliation, Afraid, Rape, and Kick questionnaire    Fear of Current or Ex-Partner: No    Emotionally Abused: No    Physically Abused: No    Sexually Abused: No      Review of Systems   Constitutional:  Positive for fatigue. Negative for chills and unexpected weight change.  HENT:  Negative for congestion, rhinorrhea, sneezing and sore throat.   Eyes:  Negative for redness.  Respiratory: Negative.  Negative for cough, chest tightness, shortness of breath and wheezing.   Cardiovascular: Negative.  Negative for chest pain and palpitations.  Gastrointestinal: Negative.  Negative for abdominal pain, constipation, diarrhea, nausea and vomiting.  Genitourinary:  Negative for dysuria and frequency.  Musculoskeletal: Negative.  Negative for arthralgias, back pain, joint swelling and neck pain.  Skin:  Negative for rash.  Neurological: Negative.  Negative for tremors and numbness.  Hematological:  Negative for adenopathy. Does not bruise/bleed easily.  Psychiatric/Behavioral:  Negative for behavioral problems (Depression), sleep disturbance and suicidal ideas. The patient is not nervous/anxious.     Vital Signs: BP 136/82   Pulse 80   Temp 98.3 F (36.8 C)   Resp 16   Ht 5' 5 (1.651 m)   Wt 176 lb 3.2 oz (79.9 kg)   SpO2 96%   BMI 29.32 kg/m    Physical Exam Vitals reviewed.  Constitutional:      General: He is not in acute distress.    Appearance: Normal appearance. He is not ill-appearing.  HENT:     Head: Normocephalic and atraumatic.  Eyes:     Pupils: Pupils are equal, round, and reactive to light.  Cardiovascular:     Rate and Rhythm: Normal rate and regular rhythm.  Pulmonary:     Effort: Pulmonary effort is normal. No respiratory distress.  Neurological:     Mental Status: He is alert and oriented to person, place, and time.  Psychiatric:        Mood and Affect: Mood normal.        Behavior: Behavior normal.        Assessment/Plan: 1. Essential hypertension (Primary) Stable, continue amlodipine  as prescribed.  - amLODipine  (NORVASC ) 5 MG tablet; Take 1 tablet (5 mg total) by mouth daily.  Dispense: 90 tablet; Refill: 1  2. Gastroesophageal  reflux disease without esophagitis Continue omeprazole  as prescribed.  - omeprazole  (PRILOSEC) 40 MG capsule; Take 1 capsule (40 mg total) by mouth daily.  Dispense: 90 capsule; Refill: 1  3. Other male erectile dysfunction Continue tadalafil  as needed as prescribed. - tadalafil  (CIALIS ) 20 MG tablet; Take 1 tablet (20 mg total) by mouth daily as needed for erectile dysfunction.  Dispense: 30 tablet; Refill: 5  4. Encounter for medication review Continue prn ibuprofen  as prescribed.  - ibuprofen  (ADVIL ) 800 MG tablet; Take 1 tablet (800 mg total) by mouth every 8 (eight) hours as needed.  Dispense: 90 tablet; Refill: 0   General Counseling: carrol bondar understanding of the findings of todays visit and agrees with plan of treatment. I have discussed any further diagnostic evaluation that may be  needed or ordered today. We also reviewed his medications today. he has been encouraged to call the office with any questions or concerns that should arise related to todays visit.    No orders of the defined types were placed in this encounter.   Meds ordered this encounter  Medications   tadalafil  (CIALIS ) 20 MG tablet    Sig: Take 1 tablet (20 mg total) by mouth daily as needed for erectile dysfunction.    Dispense:  30 tablet    Refill:  5    Fill new script today, discontinue sildenafil    omeprazole  (PRILOSEC) 40 MG capsule    Sig: Take 1 capsule (40 mg total) by mouth daily.    Dispense:  90 capsule    Refill:  1    Fill new script today, note increased dose, discontinue 20 mg capsule   amLODipine  (NORVASC ) 5 MG tablet    Sig: Take 1 tablet (5 mg total) by mouth daily.    Dispense:  90 tablet    Refill:  1   ibuprofen  (ADVIL ) 800 MG tablet    Sig: Take 1 tablet (800 mg total) by mouth every 8 (eight) hours as needed.    Dispense:  90 tablet    Refill:  0    Return in about 2 months (around 01/14/2024) for F/U, BP check, Lenee Franze PCP.   Total time spent:30 Minutes Time  spent includes review of chart, medications, test results, and follow up plan with the patient.   [place] Controlled Substance Database was reviewed by me.  This patient was seen by Mardy Maxin, FNP-C in collaboration with Dr. Sigrid Bathe as a part of collaborative care agreement.   Jaimie Pippins R. Maxin, MSN, FNP-C Internal medicine

## 2023-12-06 ENCOUNTER — Ambulatory Visit
Admission: RE | Admit: 2023-12-06 | Discharge: 2023-12-06 | Disposition: A | Discharge status: Home / Self Care | Source: Ambulatory Visit | Attending: Nurse Practitioner | Admitting: Nurse Practitioner

## 2023-12-06 ENCOUNTER — Ambulatory Visit: Admitting: Nurse Practitioner

## 2023-12-06 ENCOUNTER — Ambulatory Visit
Admission: RE | Admit: 2023-12-06 | Discharge: 2023-12-06 | Disposition: A | Discharge status: Home / Self Care | Attending: Nurse Practitioner | Admitting: Nurse Practitioner

## 2023-12-06 ENCOUNTER — Encounter: Payer: Self-pay | Admitting: Nurse Practitioner

## 2023-12-06 VITALS — BP 130/84 | HR 84 | Temp 98.3°F | Resp 16 | Ht 65.0 in | Wt 176.2 lb

## 2023-12-06 DIAGNOSIS — M25461 Effusion, right knee: Secondary | ICD-10-CM | POA: Insufficient documentation

## 2023-12-06 DIAGNOSIS — M25561 Pain in right knee: Secondary | ICD-10-CM

## 2023-12-06 MED ORDER — PREDNISONE 10 MG (21) PO TBPK: ORAL_TABLET | ORAL | 0 refills | Status: DC

## 2023-12-06 MED ORDER — TRAMADOL HCL 50 MG PO TABS: 50.0000 mg | ORAL_TABLET | Freq: Four times a day (QID) | ORAL | 0 refills | Status: AC | PRN

## 2023-12-06 NOTE — Progress Notes (Signed)
 [facility] [address] [place], [place] [address]  Internal MEDICINE  Office Visit Note  Patient Name: Timothy Moore  [id number]  [id number]  Date of Service: 12/06/2023  Chief Complaint  Patient presents with   Acute Visit    Fluid on right knee     HPI Timothy Moore presents for an acute sick visit for right knee pain and swelling.  --onset of knee pain and swelling was about 1 week ago.  Tried drinking cherry juice, tried resting it, tried icing it and apply warm compress and this also did not help. No specific injury to knee, reports that he woke up and it was swollen one day.     Current Medication:  Outpatient Encounter Medications as of 12/06/2023  Medication Sig   predniSONE  (STERAPRED UNI-PAK 21 TAB) 10 MG (21) TBPK tablet Use as directed for 6 days   traMADol  (ULTRAM ) 50 MG tablet Take 1 tablet (50 mg total) by mouth every 6 (six) hours as needed for up to 5 days for moderate pain (pain score 4-6).   amLODipine  (NORVASC ) 5 MG tablet Take 1 tablet (5 mg total) by mouth daily.   ibuprofen  (ADVIL ) 800 MG tablet Take 1 tablet (800 mg total) by mouth every 8 (eight) hours as needed.   omeprazole  (PRILOSEC) 40 MG capsule Take 1 capsule (40 mg total) by mouth daily.   tadalafil  (CIALIS ) 20 MG tablet Take 1 tablet (20 mg total) by mouth daily as needed for erectile dysfunction.   No facility-administered encounter medications on file as of 12/06/2023.      Medical History: Past Medical History:  Diagnosis Date   GERD (gastroesophageal reflux disease)    Hypertension      Vital Signs: BP 130/84   Pulse 84   Temp 98.3 F (36.8 C)   Resp 16   Ht 5' 5 (1.651 m)   Wt 176 lb 3.2 oz (79.9 kg)   SpO2 94%   BMI 29.32 kg/m    Review of Systems  Constitutional:  Negative for chills, fatigue and unexpected weight change.  HENT:  Negative for congestion, postnasal drip, rhinorrhea, sneezing and sore throat.   Eyes:  Negative for redness.  Respiratory:  Negative.  Negative for cough, chest tightness, shortness of breath and wheezing.   Cardiovascular: Negative.  Negative for chest pain and palpitations.  Gastrointestinal:  Negative for abdominal pain, constipation, diarrhea, nausea and vomiting.  Genitourinary:  Negative for dysuria and frequency.  Musculoskeletal:  Positive for arthralgias, gait problem and joint swelling. Negative for back pain and neck pain.  Skin:  Negative for rash.  Neurological:  Negative for tremors and numbness.  Hematological:  Negative for adenopathy. Does not bruise/bleed easily.  Psychiatric/Behavioral:  Negative for behavioral problems (Depression), sleep disturbance and suicidal ideas. The patient is not nervous/anxious.     Physical Exam Vitals reviewed.  Constitutional:      General: He is not in acute distress.    Appearance: Normal appearance. He is not ill-appearing.  HENT:     Head: Normocephalic and atraumatic.  Eyes:     Pupils: Pupils are equal, round, and reactive to light.  Cardiovascular:     Rate and Rhythm: Normal rate and regular rhythm.  Pulmonary:     Effort: Pulmonary effort is normal. No respiratory distress.  Musculoskeletal:     Right knee: Swelling and effusion present. Decreased range of motion. Tenderness present.  Skin:    General: Skin is warm and dry.  Neurological:  Mental Status: He is alert and oriented to person, place, and time.  Psychiatric:        Mood and Affect: Mood normal.        Behavior: Behavior normal.       Assessment/Plan: 1. Effusion of right knee (Primary) Knee xray ordered and referred to orthopedic surgery in [place].  - DG Knee Complete 4 Views Right; Future - Ambulatory referral to Orthopedic Surgery - predniSONE  (STERAPRED UNI-PAK 21 TAB) 10 MG (21) TBPK tablet; Use as directed for 6 days  Dispense: 21 tablet; Refill: 0 - traMADol  (ULTRAM ) 50 MG tablet; Take 1 tablet (50 mg total) by mouth every 6 (six) hours as needed for up to 5  days for moderate pain (pain score 4-6).  Dispense: 20 tablet; Refill: 0  2. Acute pain of right knee Take prednisone  as prescribed until gone, take tramadol  as needed for pain  - predniSONE  (STERAPRED UNI-PAK 21 TAB) 10 MG (21) TBPK tablet; Use as directed for 6 days  Dispense: 21 tablet; Refill: 0 - traMADol  (ULTRAM ) 50 MG tablet; Take 1 tablet (50 mg total) by mouth every 6 (six) hours as needed for up to 5 days for moderate pain (pain score 4-6).  Dispense: 20 tablet; Refill: 0    General Counseling: Timothy Moore understanding of the findings of todays visit and agrees with plan of treatment. I have discussed any further diagnostic evaluation that may be needed or ordered today. We also reviewed his medications today. he has been encouraged to call the office with any questions or concerns that should arise related to todays visit.    Counseling:    Orders Placed This Encounter  Procedures   DG Knee Complete 4 Views Right   Ambulatory referral to Orthopedic Surgery    Meds ordered this encounter  Medications   predniSONE  (STERAPRED UNI-PAK 21 TAB) 10 MG (21) TBPK tablet    Sig: Use as directed for 6 days    Dispense:  21 tablet    Refill:  0    Fill new script today   traMADol  (ULTRAM ) 50 MG tablet    Sig: Take 1 tablet (50 mg total) by mouth every 6 (six) hours as needed for up to 5 days for moderate pain (pain score 4-6).    Dispense:  20 tablet    Refill:  0    Fill new script today    Return if symptoms worsen or fail to improve.  [place] Controlled Substance Database was reviewed by me for overdose risk score (ORS)  Time spent:20 Minutes Time spent with patient included reviewing progress notes, labs, imaging studies, and discussing plan for follow up.   This patient was seen by Mardy Maxin, FNP-C in collaboration with Dr. Sigrid Bathe as a part of collaborative care agreement.  Azalya Galyon R. Maxin, MSN, FNP-C Internal Medicine

## 2023-12-07 ENCOUNTER — Encounter: Payer: Self-pay | Admitting: Nurse Practitioner

## 2023-12-08 ENCOUNTER — Encounter: Payer: Self-pay | Admitting: Nurse Practitioner

## 2023-12-08 ENCOUNTER — Telehealth: Payer: Self-pay | Admitting: Nurse Practitioner

## 2023-12-08 NOTE — Telephone Encounter (Signed)
 Orthopedic referral faxed to the [facility] per patient request; [phone].  Gave pt telephone [phone]. I sent mychart message to patient-Timothy Moore

## 2023-12-11 ENCOUNTER — Other Ambulatory Visit: Payer: Self-pay

## 2023-12-12 MED ORDER — SELENIUM SULFIDE 2.5 % EX LOTN: TOPICAL_LOTION | CUTANEOUS | 0 refills | Status: DC

## 2023-12-22 ENCOUNTER — Telehealth: Payer: Self-pay

## 2023-12-22 ENCOUNTER — Other Ambulatory Visit: Payer: Self-pay

## 2023-12-22 MED ORDER — AZITHROMYCIN 250 MG PO TABS: ORAL_TABLET | ORAL | 0 refills | Status: DC

## 2023-12-22 NOTE — Telephone Encounter (Signed)
 Pt advised as per Timothy Moore that we sent ZPak and also take OTC Mucinex and Flonase

## 2024-01-15 ENCOUNTER — Ambulatory Visit: Admitting: Nurse Practitioner

## 2024-01-16 ENCOUNTER — Ambulatory Visit (INDEPENDENT_AMBULATORY_CARE_PROVIDER_SITE_OTHER): Admitting: Nurse Practitioner

## 2024-01-16 ENCOUNTER — Encounter: Payer: Self-pay | Admitting: Nurse Practitioner

## 2024-01-16 VITALS — BP 130/82 | HR 80 | Temp 98.3°F | Resp 16 | Ht 65.0 in | Wt 174.4 lb

## 2024-01-16 DIAGNOSIS — N528 Other male erectile dysfunction: Secondary | ICD-10-CM | POA: Diagnosis not present

## 2024-01-16 DIAGNOSIS — I10 Essential (primary) hypertension: Secondary | ICD-10-CM | POA: Diagnosis not present

## 2024-01-16 DIAGNOSIS — K219 Gastro-esophageal reflux disease without esophagitis: Secondary | ICD-10-CM

## 2024-01-16 DIAGNOSIS — M25561 Pain in right knee: Secondary | ICD-10-CM

## 2024-01-16 DIAGNOSIS — Z113 Encounter for screening for infections with a predominantly sexual mode of transmission: Secondary | ICD-10-CM

## 2024-01-16 DIAGNOSIS — G8929 Other chronic pain: Secondary | ICD-10-CM

## 2024-01-16 MED ORDER — OMEPRAZOLE 20 MG PO CPDR: 20.0000 mg | DELAYED_RELEASE_CAPSULE | Freq: Every day | ORAL | 3 refills | Status: DC

## 2024-01-16 MED ORDER — SILDENAFIL CITRATE 100 MG PO TABS: 100.0000 mg | ORAL_TABLET | Freq: Every day | ORAL | 4 refills | Status: DC | PRN

## 2024-01-16 NOTE — Progress Notes (Signed)
 [facility] [address] [place], [place] [address]  Internal MEDICINE  Office Visit Note  Patient Name: Timothy Moore  [id number]  [id number]  Date of Service: 01/16/2024  Chief Complaint  Patient presents with   Follow-up   Hypertension   Knee Pain    Right knee pain ongoing    HPI Macy presents for a follow-up visit for right knee pain, GERD, ED and hypertension.  Right knee pain -- seeing orthopedic specialist, has appt tomorrow  GERD -- taking omeprazole  40 mg daily but his acid reflux has gotten worse, wants to decrease the dose back to 20 mg ED -- tried tadalafil  but it did not work as well. Wants to go back to sildenafil   Hypertension -- BP is controlled with amlodipine .  Requesting to be screened for STDs.    Current Medication: Outpatient Encounter Medications as of 01/16/2024  Medication Sig   amLODipine  (NORVASC ) 5 MG tablet Take 1 tablet (5 mg total) by mouth daily.   ibuprofen  (ADVIL ) 800 MG tablet Take 1 tablet (800 mg total) by mouth every 8 (eight) hours as needed.   omeprazole  (PRILOSEC) 20 MG capsule Take 1 capsule (20 mg total) by mouth daily.   selenium sulfide (SELSUN) 2.5 % lotion Apply lotion to the rash on the face and leave on for 30 minutes daily then wash off. Repeat this for 7 days. And then as needed if rash recurs.   sildenafil  (VIAGRA ) 100 MG tablet Take 1 tablet (100 mg total) by mouth daily as needed for erectile dysfunction.   [DISCONTINUED] omeprazole  (PRILOSEC) 40 MG capsule Take 1 capsule (40 mg total) by mouth daily.   [DISCONTINUED] tadalafil  (CIALIS ) 20 MG tablet Take 1 tablet (20 mg total) by mouth daily as needed for erectile dysfunction.   [DISCONTINUED] azithromycin  (ZITHROMAX ) 250 MG tablet Use as directed for  5 days   [DISCONTINUED] predniSONE  (STERAPRED UNI-PAK 21 TAB) 10 MG (21) TBPK tablet Use as directed for 6 days   No facility-administered encounter medications on file as of 01/16/2024.    Surgical  History: Past Surgical History:  Procedure Laterality Date   COLONOSCOPY WITH PROPOFOL  N/A 05/29/2020   Procedure: COLONOSCOPY WITH PROPOFOL ;  Surgeon: Janalyn Keene NOVAK, MD;  Location: [facility];  Service: Endoscopy;  Laterality: N/A;   ESOPHAGOGASTRODUODENOSCOPY (EGD) WITH PROPOFOL  N/A 05/29/2020   Procedure: ESOPHAGOGASTRODUODENOSCOPY (EGD) WITH PROPOFOL ;  Surgeon: Janalyn Keene NOVAK, MD;  Location: [facility];  Service: Endoscopy;  Laterality: N/A;   ESOPHAGOGASTRODUODENOSCOPY (EGD) WITH PROPOFOL  N/A 03/12/2021   Procedure: ESOPHAGOGASTRODUODENOSCOPY (EGD) WITH PROPOFOL ;  Surgeon: Janalyn Keene NOVAK, MD;  Location: [facility];  Service: Endoscopy;  Laterality: N/A;   HAND SURGERY Left     Medical History: Past Medical History:  Diagnosis Date   GERD (gastroesophageal reflux disease)    Hypertension     Family History: Family History  Problem Relation Age of Onset   Kidney Stones Mother    Cancer Father    Lupus Sister    Cancer Maternal Grandmother    Cancer Paternal Grandmother     Social History   Socioeconomic History   Marital status: Single    Spouse name: Not on file   Number of children: Not on file   Years of education: Not on file   Highest education level: Not on file  Occupational History   Not on file  Tobacco Use   Smoking status: Former    Current packs/day: 0.00    Types: Cigarettes    Quit  date: 03/14/2009    Years since quitting: 14.8   Smokeless tobacco: Never   Tobacco comments:    quit 11 years ago   Vaping Use   Vaping status: Never Used  Substance and Sexual Activity   Alcohol use: Yes    Comment: 2 shots a month   Drug use: Never    Comment: marijuana 20 years ago   Sexual activity: Yes    Birth control/protection: Condom  Other Topics Concern   Not on file  Social History Narrative   Not on file   Social Drivers of Health   Financial Resource Strain: Not on file  Food Insecurity: Not on file  Transportation  Needs: Not on file  Physical Activity: Not on file  Stress: Not on file  Social Connections: Not on file  Intimate Partner Violence: Not At Risk (12/13/2021)   Humiliation, Afraid, Rape, and Kick questionnaire    Fear of Current or Ex-Partner: No    Emotionally Abused: No    Physically Abused: No    Sexually Abused: No      Review of Systems  Constitutional:  Negative for chills, fatigue and unexpected weight change.  HENT:  Negative for congestion, postnasal drip, rhinorrhea, sneezing and sore throat.   Eyes:  Negative for redness.  Respiratory: Negative.  Negative for cough, chest tightness, shortness of breath and wheezing.   Cardiovascular: Negative.  Negative for chest pain and palpitations.  Gastrointestinal:  Negative for abdominal pain, constipation, diarrhea, nausea and vomiting.  Genitourinary:  Negative for dysuria and frequency.  Musculoskeletal:  Positive for arthralgias, gait problem and joint swelling. Negative for back pain and neck pain.  Skin:  Negative for rash.  Neurological:  Negative for tremors and numbness.  Hematological:  Negative for adenopathy. Does not bruise/bleed easily.  Psychiatric/Behavioral:  Negative for behavioral problems (Depression), sleep disturbance and suicidal ideas. The patient is not nervous/anxious.     Vital Signs: BP 130/82 Comment: 129/90  Pulse 80   Temp 98.3 F (36.8 C)   Resp 16   Ht 5' 5 (1.651 m)   Wt 174 lb 6.4 oz (79.1 kg)   SpO2 97%   BMI 29.02 kg/m    Physical Exam Vitals reviewed.  Constitutional:      General: He is not in acute distress.    Appearance: Normal appearance. He is not ill-appearing.  HENT:     Head: Normocephalic and atraumatic.  Eyes:     Pupils: Pupils are equal, round, and reactive to light.  Cardiovascular:     Rate and Rhythm: Normal rate and regular rhythm.  Pulmonary:     Effort: Pulmonary effort is normal. No respiratory distress.  Musculoskeletal:     Right knee: No swelling.  Decreased range of motion. No tenderness.  Skin:    General: Skin is warm and dry.  Neurological:     Mental Status: He is alert and oriented to person, place, and time.  Psychiatric:        Mood and Affect: Mood normal.        Behavior: Behavior normal.        Assessment/Plan: 1. Essential hypertension (Primary) Stable, Continue amlodipine  as prescribed  2. Chronic pain of right knee Referred to orthopedic surgery in September.  3. Gastroesophageal reflux disease without esophagitis Continue omeprazole  as prescribed  - omeprazole  (PRILOSEC) 20 MG capsule; Take 1 capsule (20 mg total) by mouth daily.  Dispense: 90 capsule; Refill: 3  4. Other male erectile dysfunction Continue prn  sildenafil  as prescribed  - sildenafil  (VIAGRA ) 100 MG tablet; Take 1 tablet (100 mg total) by mouth daily as needed for erectile dysfunction.  Dispense: 10 tablet; Refill: 4  5. Screen for sexually transmitted diseases Urine sent to lab for testing  - Chlamydia/Gonococcus/Trichomonas, NAA   General Counseling: [email or web address] balfour understanding of the findings of todays visit and agrees with plan of treatment. I have discussed any further diagnostic evaluation that may be needed or ordered today. We also reviewed his medications today. he has been encouraged to call the office with any questions or concerns that should arise related to todays visit.    Orders Placed This Encounter  Procedures   Chlamydia/Gonococcus/Trichomonas, NAA    Meds ordered this encounter  Medications   sildenafil  (VIAGRA ) 100 MG tablet    Sig: Take 1 tablet (100 mg total) by mouth daily as needed for erectile dysfunction.    Dispense:  10 tablet    Refill:  4    Discontinue tadalafil  and fill new script today   omeprazole  (PRILOSEC) 20 MG capsule    Sig: Take 1 capsule (20 mg total) by mouth daily.    Dispense:  90 capsule    Refill:  3    Note decreased dose, fill new script today, discontinue 40 mg dose.     Return in about 2 months (around 03/17/2024) for F/U, BP check, Maguadalupe Lata PCP.   Total time spent:30 Minutes Time spent includes review of chart, medications, test results, and follow up plan with the patient.   [place] Controlled Substance Database was reviewed by me.  This patient was seen by Mardy Maxin, FNP-C in collaboration with Dr. Sigrid Bathe as a part of collaborative care agreement.   Dannica Bickham R. Maxin, MSN, FNP-C Internal medicine

## 2024-01-18 LAB — CHLAMYDIA/GONOCOCCUS/TRICHOMONAS, NAA
Chlamydia by NAA: NEGATIVE
Gonococcus by NAA: NEGATIVE
Trich vag by NAA: NEGATIVE

## 2024-01-19 ENCOUNTER — Encounter: Payer: Self-pay | Admitting: Nurse Practitioner

## 2024-01-19 ENCOUNTER — Other Ambulatory Visit: Payer: Self-pay

## 2024-01-19 DIAGNOSIS — I10 Essential (primary) hypertension: Secondary | ICD-10-CM | POA: Insufficient documentation

## 2024-01-19 DIAGNOSIS — K219 Gastro-esophageal reflux disease without esophagitis: Secondary | ICD-10-CM | POA: Diagnosis not present

## 2024-01-19 DIAGNOSIS — Z79899 Other long term (current) drug therapy: Secondary | ICD-10-CM | POA: Diagnosis not present

## 2024-01-19 DIAGNOSIS — R066 Hiccough: Secondary | ICD-10-CM | POA: Insufficient documentation

## 2024-01-19 NOTE — ED Triage Notes (Signed)
 Pt states he has had the hiccups for 2 days. Stopped when he got to the ED. Endorsing mild right sided discomfort. Hx acid reflux

## 2024-01-20 ENCOUNTER — Emergency Department
Admission: EM | Admit: 2024-01-20 | Discharge: 2024-01-20 | Disposition: A | Discharge status: Home / Self Care | Attending: Emergency Medicine | Admitting: Emergency Medicine

## 2024-01-20 DIAGNOSIS — K219 Gastro-esophageal reflux disease without esophagitis: Secondary | ICD-10-CM

## 2024-01-20 DIAGNOSIS — R066 Hiccough: Secondary | ICD-10-CM

## 2024-01-20 MED ORDER — ONDANSETRON 4 MG PO TBDP: 4.0000 mg | ORAL_TABLET | Freq: Four times a day (QID) | ORAL | 0 refills | Status: DC | PRN

## 2024-01-20 MED ORDER — PANTOPRAZOLE SODIUM 40 MG PO TBEC: 40.0000 mg | DELAYED_RELEASE_TABLET | Freq: Every day | ORAL | 1 refills | Status: DC

## 2024-01-20 MED ORDER — LIDOCAINE VISCOUS HCL 2 % MT SOLN: 15.0000 mL | OROMUCOSAL | 0 refills | Status: DC | PRN

## 2024-01-20 MED ORDER — PANTOPRAZOLE SODIUM 40 MG PO TBEC
40.0000 mg | DELAYED_RELEASE_TABLET | Freq: Once | ORAL | Status: AC
  Administered 2024-01-20: 40 mg via ORAL
  Filled 2024-01-20: qty 1

## 2024-01-20 MED ORDER — ONDANSETRON 4 MG PO TBDP
4.0000 mg | ORAL_TABLET | Freq: Once | ORAL | Status: AC
  Administered 2024-01-20: 4 mg via ORAL
  Filled 2024-01-20: qty 1

## 2024-01-20 MED ORDER — ALUM & MAG HYDROXIDE-SIMETH 200-200-20 MG/5ML PO SUSP
30.0000 mL | Freq: Once | ORAL | Status: AC
  Administered 2024-01-20: 30 mL via ORAL
  Filled 2024-01-20: qty 30

## 2024-01-20 MED ORDER — LIDOCAINE VISCOUS HCL 2 % MT SOLN
15.0000 mL | Freq: Once | OROMUCOSAL | Status: AC
  Administered 2024-01-20: 15 mL via OROMUCOSAL
  Filled 2024-01-20: qty 15

## 2024-01-20 NOTE — Discharge Instructions (Signed)
Please avoid NSAIDs such as aspirin (Goody powders), ibuprofen (Motrin, Advil), naproxen (Aleve) as these may worsen your symptoms.  Tylenol 1000 mg every 6 hours is safe to take as long as you have no history of liver problems (heavy alcohol use, cirrhosis, hepatitis).  Please avoid spicy, acidic (citrus fruits, tomato based sauces, salsa), greasy, fatty foods.  Please avoid caffeine and alcohol.  Smoking can also make GERD/acid reflux worse.  Over the counter medications such as TUMS, Maalox or Mylanta, pepcid, Prilosec or Nexium may help with your symptoms.  Do not take Prilosec or Nexium if you are already prescribed a proton pump inhibitor.

## 2024-01-20 NOTE — ED Provider Notes (Signed)
 [facility] Provider Note    Event Date/Time   First MD Initiated Contact with Patient 01/20/24 0309     (approximate)   History   Hiccups   HPI  Timothy Moore is a 58 y.o. male with history of hypertension, GERD who presents to the emergency department with heartburn and hiccups.  States symptoms ongoing for hours, intermittent in nature.  No vomiting, chest pain, shortness of breath.  Did have some right-sided abdominal pain that has now resolved.  No diarrhea, urinary symptoms.  No prior abdominal surgery.  Denies drinking alcohol today.  No prior history of intractable hiccups.   History provided by patient.    Past Medical History:  Diagnosis Date   GERD (gastroesophageal reflux disease)    Hypertension     Past Surgical History:  Procedure Laterality Date   COLONOSCOPY WITH PROPOFOL  N/A 05/29/2020   Procedure: COLONOSCOPY WITH PROPOFOL ;  Surgeon: Janalyn Keene NOVAK, MD;  Location: [facility];  Service: Endoscopy;  Laterality: N/A;   ESOPHAGOGASTRODUODENOSCOPY (EGD) WITH PROPOFOL  N/A 05/29/2020   Procedure: ESOPHAGOGASTRODUODENOSCOPY (EGD) WITH PROPOFOL ;  Surgeon: Janalyn Keene NOVAK, MD;  Location: [facility];  Service: Endoscopy;  Laterality: N/A;   ESOPHAGOGASTRODUODENOSCOPY (EGD) WITH PROPOFOL  N/A 03/12/2021   Procedure: ESOPHAGOGASTRODUODENOSCOPY (EGD) WITH PROPOFOL ;  Surgeon: Janalyn Keene NOVAK, MD;  Location: [facility];  Service: Endoscopy;  Laterality: N/A;   HAND SURGERY Left     MEDICATIONS:  Prior to Admission medications   Medication Sig Start Date End Date Taking? Authorizing Provider  amLODipine  (NORVASC ) 5 MG tablet Take 1 tablet (5 mg total) by mouth daily. 11/14/23   Liana Fish, NP  ibuprofen  (ADVIL ) 800 MG tablet Take 1 tablet (800 mg total) by mouth every 8 (eight) hours as needed. 11/14/23   Liana Fish, NP  omeprazole  (PRILOSEC) 20 MG capsule Take 1 capsule (20 mg total) by mouth daily. 01/16/24    Abernathy, Alyssa, NP  selenium sulfide (SELSUN) 2.5 % lotion Apply lotion to the rash on the face and leave on for 30 minutes daily then wash off. Repeat this for 7 days. And then as needed if rash recurs. 12/12/23   Liana Fish, NP  sildenafil  (VIAGRA ) 100 MG tablet Take 1 tablet (100 mg total) by mouth daily as needed for erectile dysfunction. 01/16/24   Liana Fish, NP    Physical Exam   Triage Vital Signs: ED Triage Vitals  Encounter Vitals Group     BP 01/19/24 2314 (!) 129/93     Girls Systolic BP Percentile --      Girls Diastolic BP Percentile --      Boys Systolic BP Percentile --      Boys Diastolic BP Percentile --      Pulse Rate 01/19/24 2314 76     Resp 01/19/24 2314 16     Temp 01/19/24 2314 98.9 F (37.2 C)     Temp Source 01/19/24 2314 Oral     SpO2 01/19/24 2314 97 %     Weight 01/19/24 2313 172 lb (78 kg)     Height 01/19/24 2313 5' 5 (1.651 m)     Head Circumference --      Peak Flow --      Pain Score 01/19/24 2313 3     Pain Loc --      Pain Education --      Exclude from Growth Chart --     Most recent vital signs: Vitals:   01/19/24 2314 01/20/24 0449  BP: (!) 129/93 (!) 135/95  Pulse: 76 71  Resp: 16 16  Temp: 98.9 F (37.2 C) 97.8 F (36.6 C)  SpO2: 97% 100%    CONSTITUTIONAL: Alert, responds appropriately to questions. Well-appearing; well-nourished HEAD: Normocephalic, atraumatic EYES: Conjunctivae clear, pupils appear equal, sclera nonicteric ENT: normal nose; moist mucous membranes NECK: Supple, normal ROM CARD: RRR; S1 and S2 appreciated RESP: Normal chest excursion without splinting or tachypnea; breath sounds clear and equal bilaterally; no wheezes, no rhonchi, no rales, no hypoxia or respiratory distress, speaking full sentences ABD/GI: Non-distended; soft, non-tender, no rebound, no guarding, no peritoneal signs BACK: The back appears normal EXT: Normal ROM in all joints; no deformity noted, no edema SKIN: Normal  color for age and race; warm; no rash on exposed skin NEURO: Moves all extremities equally, normal speech PSYCH: The patient's mood and manner are appropriate.   ED Results / Procedures / Treatments   LABS: (all labs ordered are listed, but only abnormal results are displayed) Labs Reviewed - No data to display   EKG:   RADIOLOGY: My personal review and interpretation of imaging:    I have personally reviewed all radiology reports.   No results found.   PROCEDURES:  Critical Care performed: No    Procedures    IMPRESSION / MDM / ASSESSMENT AND PLAN / ED COURSE  I reviewed the triage vital signs and the nursing notes.    Patient here for complaints of heartburn and hiccups.  Symptoms intermittent.  Abdominal exam benign.  No chest pain or shortness of breath.    DIFFERENTIAL DIAGNOSIS (includes but not limited to):   Intractable hiccups, GERD, doubt ACS, PE, dissection, pneumomediastinum, pneumothorax, cholelithiasis, cholecystitis, pancreatitis, appendicitis   Patient's presentation is most consistent with acute complicated illness / injury requiring diagnostic workup.   PLAN: Will give Protonix, Mylanta, viscous lidocaine , Zofran  and reassess.  No indication for emergent imaging.  Abdominal exam is benign.   MEDICATIONS GIVEN IN ED: Medications  pantoprazole (PROTONIX) EC tablet 40 mg (40 mg Oral Given 01/20/24 0333)  alum & mag hydroxide-simeth (MAALOX/MYLANTA) 200-200-20 MG/5ML suspension 30 mL (30 mLs Oral Given 01/20/24 0333)  lidocaine  (XYLOCAINE ) 2 % viscous mouth solution 15 mL (15 mLs Mouth/Throat Given 01/20/24 0333)  ondansetron  (ZOFRAN -ODT) disintegrating tablet 4 mg (4 mg Oral Given 01/20/24 0332)     ED COURSE: Patient reports feeling much better.  Tolerating p.o.  No further hiccups.  Suspect hiccups secondary to acid reflux which she has a known history of.  Will discharge with Protonix, lidocaine  and gave supportive care instructions and  recommended diet changes.  Recommended avoiding alcohol.  At this time, I do not feel there is any life-threatening condition present. I reviewed all nursing notes, vitals, pertinent previous records.  All lab and urine results, EKGs, imaging ordered have been independently reviewed and interpreted by myself.  I reviewed all available radiology reports from any imaging ordered this visit.  Based on my assessment, I feel the patient is safe to be discharged home without further emergent workup and can continue workup as an outpatient as needed. Discussed all findings, treatment plan as well as usual and customary return precautions.  They verbalize understanding and are comfortable with this plan.  Outpatient follow-up has been provided as needed.  All questions have been answered.    CONSULTS:  none   OUTSIDE RECORDS REVIEWED: Reviewed recent pulmonology notes.       FINAL CLINICAL IMPRESSION(S) / ED DIAGNOSES   Final diagnoses:  Intractable hiccups  Gastroesophageal reflux disease, unspecified whether esophagitis present     Rx / DC Orders   ED Discharge Orders          Ordered    pantoprazole (PROTONIX) 40 MG tablet  Daily        01/20/24 0445    ondansetron  (ZOFRAN -ODT) 4 MG disintegrating tablet  Every 6 hours PRN        01/20/24 0445    lidocaine  (XYLOCAINE ) 2 % solution  Every 4 hours PRN        01/20/24 0445             Note:  This document was prepared using Dragon voice recognition software and may include unintentional dictation errors.   Sergi Gellner, Josette SAILOR, DO 01/20/24 [phone]

## 2024-01-29 ENCOUNTER — Encounter: Payer: Self-pay | Admitting: Nurse Practitioner

## 2024-02-09 ENCOUNTER — Ambulatory Visit
Admission: EM | Admit: 2024-02-09 | Discharge: 2024-02-09 | Disposition: A | Discharge status: Home / Self Care | Attending: Emergency Medicine | Admitting: Emergency Medicine

## 2024-02-09 DIAGNOSIS — I861 Scrotal varices: Secondary | ICD-10-CM | POA: Diagnosis present

## 2024-02-09 DIAGNOSIS — N50812 Left testicular pain: Secondary | ICD-10-CM | POA: Diagnosis present

## 2024-02-09 NOTE — ED Triage Notes (Signed)
 Pt reports left testicle pain, started last night denies swelling.

## 2024-02-09 NOTE — ED Provider Notes (Signed)
 [facility]    CSN: [id number] Arrival date & time: 02/09/24  1328      History   Chief Complaint No chief complaint on file.   HPI Timothy Moore is a 58 y.o. male.   HPI  58 year old male with past medical history significant for hypertension, GERD, mixed hyperlipidemia, and ED presents for evaluation of left testicular pain that began last night.  He denies any injury or heavy lifting or straining.  He reports that the last time he felt like this he had contracted trichomonas.  Past Medical History:  Diagnosis Date   GERD (gastroesophageal reflux disease)    Hypertension     Patient Active Problem List   Diagnosis Date Noted   Chronic pain of right knee 01/16/2024   Gastroesophageal reflux disease without esophagitis 11/14/2023   Vitamin D  deficiency 05/20/2023   Other male erectile dysfunction 05/20/2023   Mixed hyperlipidemia 04/03/2023   Essential hypertension 12/13/2021   History of Helicobacter pylori infection    Gastric intestinal metaplasia    Acute gastric erosion    Encounter for screening for upper gastrointestinal disorder    Schatzki's ring    Duodenal nodule    Gastric erythema    Screen for colon cancer    Polyp of sigmoid colon     Past Surgical History:  Procedure Laterality Date   COLONOSCOPY WITH PROPOFOL  N/A 05/29/2020   Procedure: COLONOSCOPY WITH PROPOFOL ;  Surgeon: Janalyn Keene NOVAK, MD;  Location: [facility];  Service: Endoscopy;  Laterality: N/A;   ESOPHAGOGASTRODUODENOSCOPY (EGD) WITH PROPOFOL  N/A 05/29/2020   Procedure: ESOPHAGOGASTRODUODENOSCOPY (EGD) WITH PROPOFOL ;  Surgeon: Janalyn Keene NOVAK, MD;  Location: [facility];  Service: Endoscopy;  Laterality: N/A;   ESOPHAGOGASTRODUODENOSCOPY (EGD) WITH PROPOFOL  N/A 03/12/2021   Procedure: ESOPHAGOGASTRODUODENOSCOPY (EGD) WITH PROPOFOL ;  Surgeon: Janalyn Keene NOVAK, MD;  Location: [facility];  Service: Endoscopy;  Laterality: N/A;   HAND SURGERY Left         Home Medications    Prior to Admission medications   Medication Sig Start Date End Date Taking? Authorizing Provider  amLODipine  (NORVASC ) 5 MG tablet Take 1 tablet (5 mg total) by mouth daily. 11/14/23   Liana Fish, NP  ibuprofen  (ADVIL ) 800 MG tablet Take 1 tablet (800 mg total) by mouth every 8 (eight) hours as needed. 11/14/23   Liana Fish, NP  lidocaine  (XYLOCAINE ) 2 % solution Use as directed 15 mLs in the mouth or throat every 4 (four) hours as needed. 01/20/24   Ward, Josette SAILOR, DO  omeprazole  (PRILOSEC) 20 MG capsule Take 1 capsule (20 mg total) by mouth daily. 01/16/24   Liana Fish, NP  ondansetron  (ZOFRAN -ODT) 4 MG disintegrating tablet Take 1 tablet (4 mg total) by mouth every 6 (six) hours as needed for nausea or vomiting. 01/20/24   Ward, Josette SAILOR, DO  pantoprazole  (PROTONIX ) 40 MG tablet Take 1 tablet (40 mg total) by mouth daily. 01/20/24 03/20/24  Ward, Josette SAILOR, DO  selenium  sulfide (SELSUN ) 2.5 % lotion Apply lotion to the rash on the face and leave on for 30 minutes daily then wash off. Repeat this for 7 days. And then as needed if rash recurs. 12/12/23   Liana Fish, NP  sildenafil  (VIAGRA ) 100 MG tablet Take 1 tablet (100 mg total) by mouth daily as needed for erectile dysfunction. 01/16/24   Liana Fish, NP    Family History Family History  Problem Relation Age of Onset   Kidney Stones Mother    Cancer Father  Lupus Sister    Cancer Maternal Grandmother    Cancer Paternal Grandmother     Social History Social History   Tobacco Use   Smoking status: Former    Current packs/day: 0.00    Types: Cigarettes    Quit date: 03/14/2009    Years since quitting: 14.9   Smokeless tobacco: Never   Tobacco comments:    quit 11 years ago   Vaping Use   Vaping status: Never Used  Substance Use Topics   Alcohol use: Yes    Comment: 2 shots a month   Drug use: Never    Comment: marijuana 20 years ago     Allergies   Patient has no  known allergies.   Review of Systems Review of Systems  Genitourinary:  Positive for testicular pain. Negative for dysuria, frequency, genital sores, hematuria, penile discharge, penile pain, penile swelling, scrotal swelling and urgency.     Physical Exam Triage Vital Signs ED Triage Vitals  Encounter Vitals Group     BP      Girls Systolic BP Percentile      Girls Diastolic BP Percentile      Boys Systolic BP Percentile      Boys Diastolic BP Percentile      Pulse      Resp      Temp      Temp src      SpO2      Weight      Height      Head Circumference      Peak Flow      Pain Score      Pain Loc      Pain Education      Exclude from Growth Chart    No data found.  Updated Vital Signs BP (!) 138/98 (BP Location: Right Arm)   Pulse 67   Temp 98.6 F (37 C) (Oral)   Resp 20   SpO2 98%   Visual Acuity Right Eye Distance:   Left Eye Distance:   Bilateral Distance:    Right Eye Near:   Left Eye Near:    Bilateral Near:     Physical Exam Vitals and nursing note reviewed.  Constitutional:      Appearance: Normal appearance. He is not ill-appearing.  HENT:     Head: Normocephalic and atraumatic.  Genitourinary:    Penis: Normal.      Testes: Normal.     Comments: Testes are low-volume but nontender to palpation and smooth.  Edema of bilateral epididymis complexes without tenderness.  Bilateral varicosities, right greater than left. Neurological:     Mental Status: He is alert.      UC Treatments / Results  Labs (all labs ordered are listed, but only abnormal results are displayed) Labs Reviewed  CYTOLOGY, (ORAL, ANAL, URETHRAL) ANCILLARY ONLY    EKG   Radiology No results found.  Procedures Procedures (including critical care time)  Medications Ordered in UC Medications - No data to display  Initial Impression / Assessment and Plan / UC Course  I have reviewed the triage vital signs and the nursing notes.  Pertinent labs & imaging  results that were available during my care of the patient were reviewed by me and considered in my medical decision making (see chart for details).   Patient is a nontoxic-appearing 58 year old male presenting for evaluation of acute onset left testicular pain as outlined in HPI above.  He reports that last time he felt pain  like this he was diagnosed with trichomonas.  He denies any painful urination, penile discharge, rashes or lesions on his penis or scrotum, heavy lifting, or any recent trauma.  He is sexually active with multiple partners.  He reports that he uses contraception on occasion with 1 partner but not with the other 2.  On exam patient has no discharge from his urethral meatus and no rashes or lesions on his glans penis or the shaft of the penis.  Testicles are small volume but smooth and nontender.  He does have edema of bilateral epididymis complexes as well as palpable varicoceles.  The varicocele on the right is greater than left and it is visible through the scrotum.  It is also tender to palpation.  I did collect a cytology swab to assess for gonorrhea, chlamydia, and trichomonas.  He reports that he was recently tested for HIV and syphilis and does not want repeat testing at this time.  However, I will refer him to urology for evaluation of his varicoceles.   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  Abstain from intercourse, or at least unprotected intercourse, until after you have received your test results and completed any treatment that may be necessary.  Take 600 mg over-the-counter ibuprofen  every 6 hours with food as needed for pain.  Wear supportive underwear to help take tension off of the spermatic cord and aid in pain relief.  You may also wear an  athletic supporter.  You may apply ice to your scrotum for 20 minutes at a time, 2-3 times a day, to help with pain and inflammation.  Make sure you a cloth between the ice and your skin so as to not cause skin damage.  You have been referred to urology and they will reach out to you to schedule an appointment to discuss further treatment options.      ED Prescriptions   None    PDMP not reviewed this encounter.   Bernardino Ditch, NP 02/09/24 1359

## 2024-02-09 NOTE — Discharge Instructions (Addendum)
 We are testing for gonorrhea, chlamydia, and trichomonas.  Abstain from intercourse, or at least unprotected intercourse, until after you have received your test results and completed any treatment that may be necessary.  Take 600 mg over-the-counter ibuprofen  every 6 hours with food as needed for pain.  Wear supportive underwear to help take tension off of the spermatic cord and aid in pain relief.  You may also wear an athletic supporter.  You may apply ice to your scrotum for 20 minutes at a time, 2-3 times a day, to help with pain and inflammation.  Make sure you a cloth between the ice and your skin so as to not cause skin damage.  You have been referred to urology and they will reach out to you to schedule an appointment to discuss further treatment options.

## 2024-02-12 LAB — CYTOLOGY, (ORAL, ANAL, URETHRAL) ANCILLARY ONLY
Chlamydia: NEGATIVE
Comment: NEGATIVE
Comment: NEGATIVE
Comment: NORMAL
Neisseria Gonorrhea: NEGATIVE
Trichomonas: NEGATIVE

## 2024-02-20 ENCOUNTER — Ambulatory Visit: Admitting: Urology

## 2024-02-21 ENCOUNTER — Ambulatory Visit: Admitting: Urology

## 2024-02-21 ENCOUNTER — Encounter: Payer: Self-pay | Admitting: Urology

## 2024-02-21 VITALS — BP 138/88 | HR 100 | Ht 65.0 in | Wt 174.0 lb

## 2024-02-21 DIAGNOSIS — N5082 Scrotal pain: Secondary | ICD-10-CM

## 2024-02-21 MED ORDER — MELOXICAM 7.5 MG PO TABS: ORAL_TABLET | ORAL | 0 refills | Status: AC

## 2024-02-21 NOTE — Progress Notes (Signed)
 02/21/2024 4:40 PM   Timothy Moore 1965-07-09 [id number]  Referring provider: Bernardino Ditch, NP [address] [place],  [place] [address]  Chief Complaint  Patient presents with   Testicle Pain    HPI: Timothy Moore is a 58 y.o. male referred for evaluation of left scrotal content pain.  Urgent care visit 02/09/2024 with a 12+ hour history left scrotal pain 1 to 38-month history intermittent left hemiscrotal pain.  No precipitating, aggravating or alleviating factors No bothersome LUTS   PMH: Past Medical History:  Diagnosis Date   GERD (gastroesophageal reflux disease)    Hypertension     Surgical History: Past Surgical History:  Procedure Laterality Date   COLONOSCOPY WITH PROPOFOL  N/A 05/29/2020   Procedure: COLONOSCOPY WITH PROPOFOL ;  Surgeon: Janalyn Keene NOVAK, MD;  Location: [facility];  Service: Endoscopy;  Laterality: N/A;   ESOPHAGOGASTRODUODENOSCOPY (EGD) WITH PROPOFOL  N/A 05/29/2020   Procedure: ESOPHAGOGASTRODUODENOSCOPY (EGD) WITH PROPOFOL ;  Surgeon: Janalyn Keene NOVAK, MD;  Location: [facility];  Service: Endoscopy;  Laterality: N/A;   ESOPHAGOGASTRODUODENOSCOPY (EGD) WITH PROPOFOL  N/A 03/12/2021   Procedure: ESOPHAGOGASTRODUODENOSCOPY (EGD) WITH PROPOFOL ;  Surgeon: Janalyn Keene NOVAK, MD;  Location: [facility];  Service: Endoscopy;  Laterality: N/A;   HAND SURGERY Left     Home Medications:  Allergies as of 02/21/2024   No Known Allergies      Medication List        Accurate as of February 21, 2024  4:40 PM. If you have any questions, ask your nurse or doctor.          amLODipine  5 MG tablet Commonly known as: NORVASC  Take 1 tablet (5 mg total) by mouth daily.   ibuprofen  800 MG tablet Commonly known as: ADVIL  Take 1 tablet (800 mg total) by mouth every 8 (eight) hours as needed.   lidocaine  2 % solution Commonly known as: XYLOCAINE  Use as directed 15 mLs in the mouth or throat every 4 (four) hours as needed.    meloxicam  7.5 MG tablet Commonly known as: Mobic  Daily in the PM   omeprazole  20 MG capsule Commonly known as: PRILOSEC Take 1 capsule (20 mg total) by mouth daily.   ondansetron  4 MG disintegrating tablet Commonly known as: ZOFRAN -ODT Take 1 tablet (4 mg total) by mouth every 6 (six) hours as needed for nausea or vomiting.   pantoprazole  40 MG tablet Commonly known as: Protonix  Take 1 tablet (40 mg total) by mouth daily.   selenium  sulfide 2.5 % lotion Commonly known as: SELSUN  Apply lotion to the rash on the face and leave on for 30 minutes daily then wash off. Repeat this for 7 days. And then as needed if rash recurs.   sildenafil  100 MG tablet Commonly known as: VIAGRA  Take 1 tablet (100 mg total) by mouth daily as needed for erectile dysfunction.        Allergies: No Known Allergies  Family History: Family History  Problem Relation Age of Onset   Kidney Stones Mother    Cancer Father    Lupus Sister    Cancer Maternal Grandmother    Cancer Paternal Grandmother     Social History:  reports that he quit smoking about 14 years ago. His smoking use included cigarettes. He has never used smokeless tobacco. He reports current alcohol use. He reports that he does not use drugs.   Physical Exam: BP 138/88   Pulse 100   Ht 5' 5 (1.651 m)   Wt 174 lb (78.9 kg)  BMI 28.96 kg/m   Constitutional:  Alert, No acute distress. HEENT: [place] AT Respiratory: Normal respiratory effort, no increased work of breathing. GU: Phallus without lesions.  Left testis atrophic and moderately tender.  Estimated volume 6 cc; right testis nontender estimated volume ~10 cc Psychiatric: Normal mood and affect.   Assessment & Plan:    1.  Scrotal content pain Atrophic left testis with tenderness Scrotal ultrasound ordered Rx meloxicam  7.5 mg daily as needed   Timothy JAYSON Barba, MD  [facility] [address] [place], [place] [address] [phone]

## 2024-02-21 NOTE — Patient Instructions (Signed)
 Schedule u/s  [phone]

## 2024-02-26 ENCOUNTER — Ambulatory Visit
Admission: RE | Admit: 2024-02-26 | Discharge: 2024-02-26 | Disposition: A | Source: Ambulatory Visit | Attending: Urology | Admitting: Urology

## 2024-02-26 DIAGNOSIS — N5082 Scrotal pain: Secondary | ICD-10-CM

## 2024-03-08 ENCOUNTER — Ambulatory Visit: Payer: Self-pay | Admitting: Urology

## 2024-03-18 ENCOUNTER — Ambulatory Visit: Admitting: Nurse Practitioner

## 2024-04-03 ENCOUNTER — Ambulatory Visit (INDEPENDENT_AMBULATORY_CARE_PROVIDER_SITE_OTHER): Payer: BC Managed Care – PPO | Admitting: Nurse Practitioner

## 2024-04-03 ENCOUNTER — Other Ambulatory Visit
Admission: RE | Admit: 2024-04-03 | Discharge: 2024-04-03 | Disposition: A | Discharge status: Home / Self Care | Attending: Nurse Practitioner | Admitting: Nurse Practitioner

## 2024-04-03 ENCOUNTER — Encounter: Payer: Self-pay | Admitting: Nurse Practitioner

## 2024-04-03 VITALS — BP 128/82 | HR 100 | Temp 98.2°F | Resp 16 | Ht 65.0 in | Wt 175.4 lb

## 2024-04-03 DIAGNOSIS — E559 Vitamin D deficiency, unspecified: Secondary | ICD-10-CM | POA: Diagnosis not present

## 2024-04-03 DIAGNOSIS — E782 Mixed hyperlipidemia: Secondary | ICD-10-CM | POA: Diagnosis not present

## 2024-04-03 DIAGNOSIS — Z113 Encounter for screening for infections with a predominantly sexual mode of transmission: Secondary | ICD-10-CM | POA: Insufficient documentation

## 2024-04-03 DIAGNOSIS — Z125 Encounter for screening for malignant neoplasm of prostate: Secondary | ICD-10-CM | POA: Insufficient documentation

## 2024-04-03 DIAGNOSIS — Z0001 Encounter for general adult medical examination with abnormal findings: Secondary | ICD-10-CM | POA: Diagnosis not present

## 2024-04-03 DIAGNOSIS — N528 Other male erectile dysfunction: Secondary | ICD-10-CM | POA: Diagnosis not present

## 2024-04-03 DIAGNOSIS — I10 Essential (primary) hypertension: Secondary | ICD-10-CM

## 2024-04-03 DIAGNOSIS — E538 Deficiency of other specified B group vitamins: Secondary | ICD-10-CM | POA: Insufficient documentation

## 2024-04-03 DIAGNOSIS — K219 Gastro-esophageal reflux disease without esophagitis: Secondary | ICD-10-CM

## 2024-04-03 DIAGNOSIS — R300 Dysuria: Secondary | ICD-10-CM

## 2024-04-03 DIAGNOSIS — K130 Diseases of lips: Secondary | ICD-10-CM | POA: Diagnosis not present

## 2024-04-03 LAB — COMPREHENSIVE METABOLIC PANEL WITH GFR
ALT: 29 U/L (ref 0–44)
AST: 35 U/L (ref 15–41)
Albumin: 4.4 g/dL (ref 3.5–5.0)
Alkaline Phosphatase: 113 U/L (ref 38–126)
Anion gap: 12 (ref 5–15)
BUN: 12 mg/dL (ref 6–20)
CO2: 25 mmol/L (ref 22–32)
Calcium: 9.5 mg/dL (ref 8.9–10.3)
Chloride: 102 mmol/L (ref 98–111)
Creatinine, Ser: 1.19 mg/dL (ref 0.61–1.24)
GFR, Estimated: >60 mL/min
Glucose, Bld: 109 mg/dL — ABNORMAL HIGH (ref 70–99)
Potassium: 3.9 mmol/L (ref 3.5–5.1)
Sodium: 139 mmol/L (ref 135–145)
Total Bilirubin: 0.5 mg/dL (ref 0.0–1.2)
Total Protein: 7.2 g/dL (ref 6.5–8.1)

## 2024-04-03 LAB — CBC WITH DIFFERENTIAL/PLATELET
Abs Immature Granulocytes: 0.03 K/uL (ref 0.00–0.07)
Basophils Absolute: 0.1 K/uL (ref 0.0–0.1)
Basophils Relative: 1 %
Eosinophils Absolute: 0.3 K/uL (ref 0.0–0.5)
Eosinophils Relative: 4 %
HCT: 40.6 % (ref 39.0–52.0)
Hemoglobin: 14 g/dL (ref 13.0–17.0)
Immature Granulocytes: 0 %
Lymphocytes Relative: 24 %
Lymphs Abs: 1.9 K/uL (ref 0.7–4.0)
MCH: 29.9 pg (ref 26.0–34.0)
MCHC: 34.5 g/dL (ref 30.0–36.0)
MCV: 86.6 fL (ref 80.0–100.0)
Monocytes Absolute: 0.8 K/uL (ref 0.1–1.0)
Monocytes Relative: 10 %
Neutro Abs: 4.9 K/uL (ref 1.7–7.7)
Neutrophils Relative %: 61 %
Platelets: 254 K/uL (ref 150–400)
RBC: 4.69 MIL/uL (ref 4.22–5.81)
RDW: 14 % (ref 11.5–15.5)
WBC: 7.9 K/uL (ref 4.0–10.5)
nRBC: 0 % (ref 0.0–0.2)

## 2024-04-03 LAB — PSA: Prostatic Specific Antigen: 1.44 ng/mL (ref 0.00–4.00)

## 2024-04-03 LAB — LIPID PANEL
Cholesterol: 206 mg/dL — ABNORMAL HIGH (ref 0–200)
HDL: 51 mg/dL
LDL Cholesterol: 120 mg/dL — ABNORMAL HIGH (ref 0–99)
Total CHOL/HDL Ratio: 4 ratio
Triglycerides: 175 mg/dL — ABNORMAL HIGH
VLDL: 35 mg/dL (ref 0–40)

## 2024-04-03 LAB — VITAMIN D 25 HYDROXY (VIT D DEFICIENCY, FRACTURES): Vit D, 25-Hydroxy: 23.9 ng/mL — ABNORMAL LOW (ref 30–100)

## 2024-04-03 LAB — VITAMIN B12: Vitamin B-12: 564 pg/mL (ref 180–914)

## 2024-04-03 LAB — FOLATE: Folate: 4.1 ng/mL — ABNORMAL LOW

## 2024-04-03 MED ORDER — IBUPROFEN 800 MG PO TABS: 800.0000 mg | ORAL_TABLET | Freq: Three times a day (TID) | ORAL | 0 refills | Status: AC | PRN

## 2024-04-03 MED ORDER — AMLODIPINE BESYLATE 5 MG PO TABS: 5.0000 mg | ORAL_TABLET | Freq: Every day | ORAL | 1 refills | Status: AC

## 2024-04-03 MED ORDER — MUPIROCIN 2 % EX OINT: 1.0000 | TOPICAL_OINTMENT | Freq: Every day | CUTANEOUS | 0 refills | Status: AC | PRN

## 2024-04-03 MED ORDER — PANTOPRAZOLE SODIUM 40 MG PO TBEC: 40.0000 mg | DELAYED_RELEASE_TABLET | Freq: Every day | ORAL | 3 refills | Status: AC

## 2024-04-03 MED ORDER — SILDENAFIL CITRATE 100 MG PO TABS: 100.0000 mg | ORAL_TABLET | Freq: Every day | ORAL | 4 refills | Status: AC | PRN

## 2024-04-03 NOTE — Progress Notes (Signed)
 [facility] [address] [place], [place] [address]  Internal MEDICINE  Office Visit Note  Patient Name: Timothy Moore  [id number]  [id number]  Date of Service: 04/03/2024  Chief Complaint  Patient presents with   Gastroesophageal Reflux   Hypertension   Annual Exam    HPI Timothy Moore presents for an annual well visit and physical exam.  Well-appearing 59 y.o. male with hypertension, GERD and ED  Routine CRC screening: scheduled for colonoscopy on 04/19/24 Labs: due for routine New or worsening pain: none  Other concerns: none  Having issues with angular cheilitis again, asking for topical antibiotic ointment.  GERD -- takes pantoprazole  ED -- tried tadalafil  but wants to switch back to sildenafil .    Current Medication: Outpatient Encounter Medications as of 04/03/2024  Medication Sig   mupirocin  ointment (BACTROBAN ) 2 % Apply 1 Application topically daily as needed (angular cheilitis). To the corners of the mouth   amLODipine  (NORVASC ) 5 MG tablet Take 1 tablet (5 mg total) by mouth daily.   ibuprofen  (ADVIL ) 800 MG tablet Take 1 tablet (800 mg total) by mouth every 8 (eight) hours as needed.   meloxicam  (MOBIC ) 7.5 MG tablet Daily in the PM   pantoprazole  (PROTONIX ) 40 MG tablet Take 1 tablet (40 mg total) by mouth daily.   sildenafil  (VIAGRA ) 100 MG tablet Take 1 tablet (100 mg total) by mouth daily as needed for erectile dysfunction.   [DISCONTINUED] amLODipine  (NORVASC ) 5 MG tablet Take 1 tablet (5 mg total) by mouth daily.   [DISCONTINUED] ibuprofen  (ADVIL ) 800 MG tablet Take 1 tablet (800 mg total) by mouth every 8 (eight) hours as needed.   [DISCONTINUED] lidocaine  (XYLOCAINE ) 2 % solution Use as directed 15 mLs in the mouth or throat every 4 (four) hours as needed. (Patient not taking: Reported on 04/03/2024)   [DISCONTINUED] omeprazole  (PRILOSEC) 20 MG capsule Take 1 capsule (20 mg total) by mouth daily.   [DISCONTINUED] ondansetron  (ZOFRAN -ODT) 4 MG  disintegrating tablet Take 1 tablet (4 mg total) by mouth every 6 (six) hours as needed for nausea or vomiting. (Patient not taking: Reported on 04/03/2024)   [DISCONTINUED] pantoprazole  (PROTONIX ) 40 MG tablet Take 1 tablet (40 mg total) by mouth daily.   [DISCONTINUED] selenium  sulfide (SELSUN ) 2.5 % lotion Apply lotion to the rash on the face and leave on for 30 minutes daily then wash off. Repeat this for 7 days. And then as needed if rash recurs. (Patient not taking: Reported on 04/03/2024)   [DISCONTINUED] sildenafil  (VIAGRA ) 100 MG tablet Take 1 tablet (100 mg total) by mouth daily as needed for erectile dysfunction.   No facility-administered encounter medications on file as of 04/03/2024.    Surgical History: Past Surgical History:  Procedure Laterality Date   COLONOSCOPY WITH PROPOFOL  N/A 05/29/2020   Procedure: COLONOSCOPY WITH PROPOFOL ;  Surgeon: Janalyn Keene NOVAK, MD;  Location: [facility] ENDOSCOPY;  Service: Endoscopy;  Laterality: N/A;   ESOPHAGOGASTRODUODENOSCOPY (EGD) WITH PROPOFOL  N/A 05/29/2020   Procedure: ESOPHAGOGASTRODUODENOSCOPY (EGD) WITH PROPOFOL ;  Surgeon: Janalyn Keene NOVAK, MD;  Location: [facility] ENDOSCOPY;  Service: Endoscopy;  Laterality: N/A;   ESOPHAGOGASTRODUODENOSCOPY (EGD) WITH PROPOFOL  N/A 03/12/2021   Procedure: ESOPHAGOGASTRODUODENOSCOPY (EGD) WITH PROPOFOL ;  Surgeon: Janalyn Keene NOVAK, MD;  Location: [facility] ENDOSCOPY;  Service: Endoscopy;  Laterality: N/A;   HAND SURGERY Left     Medical History: Past Medical History:  Diagnosis Date   GERD (gastroesophageal reflux disease)    Hypertension     Family History: Family History  Problem Relation  Age of Onset   Kidney Stones Mother    Cancer Father    Lupus Sister    Cancer Maternal Grandmother    Cancer Paternal Grandmother     Social History   Socioeconomic History   Marital status: Single    Spouse name: Not on file   Number of children: Not on file   Years of education: Not on file   Highest  education level: Not on file  Occupational History   Not on file  Tobacco Use   Smoking status: Former    Current packs/day: 0.00    Types: Cigarettes    Quit date: 03/14/2009    Years since quitting: 15.0   Smokeless tobacco: Never   Tobacco comments:    quit 11 years ago   Vaping Use   Vaping status: Never Used  Substance and Sexual Activity   Alcohol use: Yes    Comment: 2 shots a month   Drug use: Never    Comment: marijuana 20 years ago   Sexual activity: Yes    Birth control/protection: Condom  Other Topics Concern   Not on file  Social History Narrative   Not on file   Social Drivers of Health   Tobacco Use: Medium Risk (04/03/2024)   Patient History    Smoking Tobacco Use: Former    Smokeless Tobacco Use: Never    Passive Exposure: Not on Actuary Strain: Low Risk  (03/21/2024)   Received from [facility]   Overall Financial Resource Strain (CARDIA)    Difficulty of Paying Living Expenses: Not hard at all  Food Insecurity: No Food Insecurity (03/21/2024)   Received from [facility]   Epic    Within the past 12 months, you worried that your food would run out before you got the money to buy more.: Never true    Within the past 12 months, the food you bought just didn't last and you didn't have money to get more.: Never true  Transportation Needs: No Transportation Needs (03/21/2024)   Received from [facility] - Transportation    In the past 12 months, has lack of transportation kept you from medical appointments or from getting medications?: No    Lack of Transportation (Non-Medical): No  Physical Activity: Not on file  Stress: Not on file  Social Connections: Not on file  Intimate Partner Violence: Not At Risk (12/13/2021)   Humiliation, Afraid, Rape, and Kick questionnaire    Fear of Current or Ex-Partner: No    Emotionally Abused: No    Physically Abused: No    Sexually Abused: No   Depression (PHQ2-9): Low Risk (04/03/2024)   Depression (PHQ2-9)    PHQ-2 Score: 0  Alcohol Screen: Low Risk (01/16/2024)   Alcohol Screen    Last Alcohol Screening Score (AUDIT): 2  Housing: Low Risk  (03/21/2024)   Received from [facility]   Epic    In the last 12 months, was there a time when you were not able to pay the mortgage or rent on time?: No    In the past 12 months, how many times have you moved where you were living?: 0    At any time in the past 12 months, were you homeless or living in a shelter (including now)?: No  Utilities: Not At Risk (03/21/2024)   Received from [facility]   Epic    In the past  12 months has the electric, gas, oil, or water company threatened to shut off services in your home?: No  Health Literacy: Not on file      Review of Systems  Constitutional:  Negative for activity change, appetite change, chills, fatigue, fever and unexpected weight change.  HENT: Negative.  Negative for congestion, ear pain, rhinorrhea, sore throat and trouble swallowing.   Eyes: Negative.   Respiratory: Negative.  Negative for cough, chest tightness, shortness of breath and wheezing.   Cardiovascular: Negative.  Negative for chest pain.  Gastrointestinal: Negative.  Negative for abdominal pain, blood in stool, constipation, diarrhea, nausea and vomiting.  Endocrine: Negative.   Genitourinary: Negative.  Negative for difficulty urinating, dysuria, frequency, hematuria and urgency.  Musculoskeletal: Negative.  Negative for arthralgias, back pain, joint swelling, myalgias and neck pain.  Skin: Negative.  Negative for rash and wound.  Allergic/Immunologic: Negative.  Negative for immunocompromised state.  Neurological: Negative.  Negative for dizziness, seizures, numbness and headaches.  Hematological: Negative.   Psychiatric/Behavioral: Negative.  Negative for behavioral problems, self-injury and suicidal ideas. The patient is not  nervous/anxious.     Vital Signs: BP 128/82   Pulse 100   Temp 98.2 F (36.8 C)   Resp 16   Ht 5' 5 (1.651 m)   Wt 175 lb 6.4 oz (79.6 kg)   SpO2 95%   BMI 29.19 kg/m    Physical Exam Vitals reviewed.  Constitutional:      General: He is awake. He is not in acute distress.    Appearance: Normal appearance. He is well-developed, well-groomed and overweight. He is not ill-appearing or diaphoretic.  HENT:     Head: Normocephalic and atraumatic.     Right Ear: Tympanic membrane, ear canal and external ear normal.     Left Ear: Tympanic membrane, ear canal and external ear normal.     Nose: Nose normal. No congestion or rhinorrhea.     Mouth/Throat:     Lips: Pink.     Mouth: Mucous membranes are moist.     Pharynx: Oropharynx is clear. Uvula midline. No oropharyngeal exudate or posterior oropharyngeal erythema.  Eyes:     General: Lids are normal. Vision grossly intact. Gaze aligned appropriately. No scleral icterus.       Right eye: No discharge.        Left eye: No discharge.     Extraocular Movements: Extraocular movements intact.     Conjunctiva/sclera: Conjunctivae normal.     Pupils: Pupils are equal, round, and reactive to light.     Funduscopic exam:    Right eye: Red reflex present.        Left eye: Red reflex present. Neck:     Thyroid: No thyromegaly.     Vascular: No JVD.     Trachea: Trachea and phonation normal. No tracheal deviation.  Cardiovascular:     Rate and Rhythm: Normal rate and regular rhythm.     Pulses: Normal pulses.     Heart sounds: Normal heart sounds, S1 normal and S2 normal. No murmur heard.    No friction rub. No gallop.  Pulmonary:     Effort: Pulmonary effort is normal. No accessory muscle usage or respiratory distress.     Breath sounds: Normal breath sounds and air entry. No stridor. No wheezing or rales.  Chest:     Chest wall: No tenderness.  Abdominal:     General: Bowel sounds are normal. There is no distension.  Palpations: Abdomen is soft. There is no shifting dullness, fluid wave, mass or pulsatile mass.     Tenderness: There is no abdominal tenderness. There is no guarding or rebound.  Musculoskeletal:        General: No tenderness or deformity. Normal range of motion.     Cervical back: Normal range of motion and neck supple.     Right lower leg: No edema.     Left lower leg: No edema.  Lymphadenopathy:     Cervical: No cervical adenopathy.  Skin:    General: Skin is warm and dry.     Capillary Refill: Capillary refill takes less than 2 seconds.     Coloration: Skin is not pale.     Findings: No erythema or rash.  Neurological:     Mental Status: He is alert and oriented to person, place, and time.     Cranial Nerves: No cranial nerve deficit.     Motor: No abnormal muscle tone.     Coordination: Coordination normal.     Deep Tendon Reflexes: Reflexes are normal and symmetric.  Psychiatric:        Mood and Affect: Mood normal.        Behavior: Behavior normal. Behavior is cooperative.        Thought Content: Thought content normal.        Judgment: Judgment normal.        Assessment/Plan: 1. Encounter for routine adult health examination with abnormal findings (Primary) Age-appropriate preventive screenings and vaccinations discussed, annual physical exam completed. Routine labs for health maintenance ordered, see below. PHM updated.  Follow up in 2 weeks to discuss lab results.  - CBC with Differential/Platelet - CMP14+EGFR - ibuprofen  (ADVIL ) 800 MG tablet; Take 1 tablet (800 mg total) by mouth every 8 (eight) hours as needed.  Dispense: 90 tablet; Refill: 0  2. Essential hypertension Stable, continue amlodipine  as prescribed. Routine labs ordered  - CBC with Differential/Platelet - CMP14+EGFR - amLODipine  (NORVASC ) 5 MG tablet; Take 1 tablet (5 mg total) by mouth daily.  Dispense: 90 tablet; Refill: 1  3. Mixed hyperlipidemia Routine labs ordered  - CBC with  Differential/Platelet - CMP14+EGFR - Lipid Profile  4. B12 deficiency Routine lab ordered  - B12 and Folate Panel  5. Vitamin D  deficiency Routine lab ordered  - Vitamin D  (25 hydroxy)  6. Angular cheilitis Use mupirocin  ointment on the corners of the mouth until resolved as prescribed.  - mupirocin  ointment (BACTROBAN ) 2 %; Apply 1 Application topically daily as needed (angular cheilitis). To the corners of the mouth  Dispense: 22 g; Refill: 0  7. Gastroesophageal reflux disease without esophagitis Discontinue omeprazole  and continue pantoprazole  as prescribed.  - pantoprazole  (PROTONIX ) 40 MG tablet; Take 1 tablet (40 mg total) by mouth daily.  Dispense: 90 tablet; Refill: 3  8. Other male erectile dysfunction Continue sildenafil  as needed as prescribed.  - sildenafil  (VIAGRA ) 100 MG tablet; Take 1 tablet (100 mg total) by mouth daily as needed for erectile dysfunction.  Dispense: 10 tablet; Refill: 4  9. Dysuria Urine sent to lab - UA/M w/rflx Culture, Routine  10. Screening for prostate cancer Routine lab ordered  - PSA Total (Reflex To Free)  11. Screen for STD (sexually transmitted disease) Routine labs ordered and urine sent to lab  - STI Profile - Chlamydia/Gonococcus/Trichomonas, NAA     General Counseling: Timothy Moore understanding of the findings of todays visit and agrees with plan of treatment. I have discussed any  further diagnostic evaluation that may be needed or ordered today. We also reviewed his medications today. he has been encouraged to call the office with any questions or concerns that should arise related to todays visit.    Orders Placed This Encounter  Procedures   Chlamydia/Gonococcus/Trichomonas, NAA   CBC with Differential/Platelet   CMP14+EGFR   Lipid Profile   PSA Total (Reflex To Free)   Vitamin D  (25 hydroxy)   B12 and Folate Panel   STI Profile    Meds ordered this encounter  Medications   mupirocin  ointment  (BACTROBAN ) 2 %    Sig: Apply 1 Application topically daily as needed (angular cheilitis). To the corners of the mouth    Dispense:  22 g    Refill:  0   pantoprazole  (PROTONIX ) 40 MG tablet    Sig: Take 1 tablet (40 mg total) by mouth daily.    Dispense:  90 tablet    Refill:  3    Discontinue omeprazole , continue pantoprazole .   sildenafil  (VIAGRA ) 100 MG tablet    Sig: Take 1 tablet (100 mg total) by mouth daily as needed for erectile dysfunction.    Dispense:  10 tablet    Refill:  4    Discontinue tadalafil  and fill new script today   amLODipine  (NORVASC ) 5 MG tablet    Sig: Take 1 tablet (5 mg total) by mouth daily.    Dispense:  90 tablet    Refill:  1   ibuprofen  (ADVIL ) 800 MG tablet    Sig: Take 1 tablet (800 mg total) by mouth every 8 (eight) hours as needed.    Dispense:  90 tablet    Refill:  0    Return in about 2 weeks (around 04/17/2024) for F/U, Labs, Tenzin Pavon PCP.   Total time spent:30 Minutes Time spent includes review of chart, medications, test results, and follow up plan with the patient.    Controlled Substance Database was reviewed by me.  This patient was seen by Mardy Maxin, FNP-C in collaboration with Dr. Sigrid Bathe as a part of collaborative care agreement.  Sya Nestler R. Maxin, MSN, FNP-C Internal medicine

## 2024-04-04 LAB — CHLAMYDIA/GONOCOCCUS/TRICHOMONAS, NAA

## 2024-04-05 LAB — MICROSCOPIC EXAMINATION
Bacteria, UA: NONE SEEN
Casts: NONE SEEN /LPF
Epithelial Cells (non renal): NONE SEEN /HPF (ref 0–10)

## 2024-04-05 LAB — MISC LABCORP TEST (SEND OUT): Labcorp test code: 144011

## 2024-04-05 LAB — UA/M W/RFLX CULTURE, ROUTINE
Bilirubin, UA: NEGATIVE
Glucose, UA: NEGATIVE
Nitrite, UA: NEGATIVE
RBC, UA: NEGATIVE
Specific Gravity, UA: 1.029 (ref 1.005–1.030)
Urobilinogen, Ur: 1 mg/dL (ref 0.2–1.0)
pH, UA: 5.5 (ref 5.0–7.5)

## 2024-04-05 LAB — URINE CULTURE, REFLEX

## 2024-04-05 LAB — CHLAMYDIA/GONOCOCCUS/TRICHOMONAS, NAA

## 2024-04-15 ENCOUNTER — Telehealth: Payer: Self-pay | Admitting: Nurse Practitioner

## 2024-04-16 ENCOUNTER — Telehealth: Payer: Self-pay | Admitting: Nurse Practitioner

## 2024-04-16 NOTE — Telephone Encounter (Signed)
 Gastroenterology faxed to [facility] due to insurance; [phone]. Notified patient. Gave pt telephone [phone]

## 2024-04-18 ENCOUNTER — Ambulatory Visit: Payer: Self-pay | Admitting: Nurse Practitioner

## 2024-04-18 ENCOUNTER — Encounter: Payer: Self-pay | Admitting: Nurse Practitioner

## 2024-04-18 ENCOUNTER — Telehealth: Admitting: Nurse Practitioner

## 2024-04-18 NOTE — Progress Notes (Signed)
 Visit canceled. Pt was advised he would need to seek in person evaluation given urinary symptoms.

## 2024-04-18 NOTE — Progress Notes (Signed)
 Lab results have been reviewed and will be discussed at his upcoming visit next week

## 2024-04-19 ENCOUNTER — Encounter: Admission: RE | Payer: Self-pay | Source: Home / Self Care

## 2024-04-19 ENCOUNTER — Ambulatory Visit: Admission: RE | Admit: 2024-04-19 | Source: Home / Self Care

## 2024-04-22 ENCOUNTER — Ambulatory Visit: Admitting: Nurse Practitioner

## 2025-04-07 ENCOUNTER — Encounter: Admitting: Nurse Practitioner
